# Patient Record
Sex: Male | Born: 1940 | Race: White | Hispanic: No | Marital: Married | Smoking: Former smoker
Health system: Southern US, Community
[De-identification: ages and names within clinical notes are randomized; demographics above are authoritative.]

## PROBLEM LIST (undated history)

## (undated) DIAGNOSIS — M109 Gout, unspecified: Secondary | ICD-10-CM

## (undated) DIAGNOSIS — H353190 Nonexudative age-related macular degeneration, unspecified eye, stage unspecified: Secondary | ICD-10-CM

## (undated) DIAGNOSIS — D649 Anemia, unspecified: Secondary | ICD-10-CM

## (undated) DIAGNOSIS — I10 Essential (primary) hypertension: Secondary | ICD-10-CM

## (undated) DIAGNOSIS — I482 Chronic atrial fibrillation, unspecified: Secondary | ICD-10-CM

## (undated) DIAGNOSIS — Z8719 Personal history of other diseases of the digestive system: Secondary | ICD-10-CM

## (undated) DIAGNOSIS — R011 Cardiac murmur, unspecified: Secondary | ICD-10-CM

## (undated) DIAGNOSIS — C801 Malignant (primary) neoplasm, unspecified: Secondary | ICD-10-CM

## (undated) DIAGNOSIS — R319 Hematuria, unspecified: Secondary | ICD-10-CM

## (undated) DIAGNOSIS — I219 Acute myocardial infarction, unspecified: Secondary | ICD-10-CM

## (undated) DIAGNOSIS — I2510 Atherosclerotic heart disease of native coronary artery without angina pectoris: Secondary | ICD-10-CM

## (undated) DIAGNOSIS — I499 Cardiac arrhythmia, unspecified: Secondary | ICD-10-CM

## (undated) DIAGNOSIS — N289 Disorder of kidney and ureter, unspecified: Secondary | ICD-10-CM

## (undated) DIAGNOSIS — E785 Hyperlipidemia, unspecified: Secondary | ICD-10-CM

## (undated) DIAGNOSIS — Z9289 Personal history of other medical treatment: Secondary | ICD-10-CM

## (undated) DIAGNOSIS — I714 Abdominal aortic aneurysm, without rupture: Secondary | ICD-10-CM

## (undated) DIAGNOSIS — K269 Duodenal ulcer, unspecified as acute or chronic, without hemorrhage or perforation: Secondary | ICD-10-CM

## (undated) HISTORY — PX: CATARACT EXTRACTION: SUR2

## (undated) HISTORY — DX: Personal history of other diseases of the digestive system: Z87.19

## (undated) HISTORY — DX: Cardiac murmur, unspecified: R01.1

## (undated) HISTORY — PX: OTHER SURGICAL HISTORY: SHX169

## (undated) HISTORY — DX: Chronic atrial fibrillation, unspecified: I48.20

## (undated) HISTORY — DX: Essential (primary) hypertension: I10

## (undated) HISTORY — DX: Acute myocardial infarction, unspecified: I21.9

## (undated) HISTORY — DX: Atherosclerotic heart disease of native coronary artery without angina pectoris: I25.10

## (undated) HISTORY — PX: CYSTOSCOPY: SUR368

## (undated) HISTORY — DX: Abdominal aortic aneurysm, without rupture: I71.4

## (undated) HISTORY — DX: Duodenal ulcer, unspecified as acute or chronic, without hemorrhage or perforation: K26.9

## (undated) HISTORY — DX: Disorder of kidney and ureter, unspecified: N28.9

## (undated) HISTORY — PX: CAROTID ENDARTERECTOMY: SUR193

## (undated) HISTORY — PX: CARDIAC CATHETERIZATION: SHX172

## (undated) HISTORY — PX: TONSILLECTOMY: SUR1361

## (undated) HISTORY — DX: Anemia, unspecified: D64.9

## (undated) HISTORY — DX: Hyperlipidemia, unspecified: E78.5

---

## 2003-12-05 ENCOUNTER — Inpatient Hospital Stay (HOSPITAL_COMMUNITY): Admission: RE | Admit: 2003-12-05 | Discharge: 2003-12-06 | Payer: Self-pay | Admitting: Vascular Surgery

## 2003-12-05 ENCOUNTER — Encounter (INDEPENDENT_AMBULATORY_CARE_PROVIDER_SITE_OTHER): Payer: Self-pay | Admitting: *Deleted

## 2004-01-11 ENCOUNTER — Encounter (INDEPENDENT_AMBULATORY_CARE_PROVIDER_SITE_OTHER): Payer: Self-pay | Admitting: *Deleted

## 2004-01-11 ENCOUNTER — Ambulatory Visit (HOSPITAL_COMMUNITY): Admission: RE | Admit: 2004-01-11 | Discharge: 2004-01-12 | Payer: Self-pay | Admitting: Vascular Surgery

## 2004-01-23 ENCOUNTER — Ambulatory Visit: Payer: Self-pay | Admitting: Internal Medicine

## 2004-06-26 ENCOUNTER — Ambulatory Visit: Payer: Self-pay | Admitting: Internal Medicine

## 2004-07-10 ENCOUNTER — Ambulatory Visit: Payer: Self-pay | Admitting: Internal Medicine

## 2004-07-24 ENCOUNTER — Ambulatory Visit: Payer: Self-pay | Admitting: Internal Medicine

## 2005-01-31 ENCOUNTER — Ambulatory Visit: Payer: Self-pay | Admitting: Internal Medicine

## 2005-02-18 ENCOUNTER — Ambulatory Visit: Payer: Self-pay | Admitting: Internal Medicine

## 2005-10-29 ENCOUNTER — Ambulatory Visit: Payer: Self-pay | Admitting: Internal Medicine

## 2005-10-29 ENCOUNTER — Ambulatory Visit: Payer: Self-pay | Admitting: Cardiology

## 2005-11-04 ENCOUNTER — Ambulatory Visit: Payer: Self-pay | Admitting: Cardiology

## 2006-08-01 ENCOUNTER — Encounter (INDEPENDENT_AMBULATORY_CARE_PROVIDER_SITE_OTHER): Payer: Self-pay | Admitting: *Deleted

## 2006-08-12 ENCOUNTER — Telehealth (INDEPENDENT_AMBULATORY_CARE_PROVIDER_SITE_OTHER): Payer: Self-pay | Admitting: *Deleted

## 2006-09-04 ENCOUNTER — Ambulatory Visit: Payer: Self-pay | Admitting: Internal Medicine

## 2006-09-11 ENCOUNTER — Ambulatory Visit: Payer: Self-pay | Admitting: Internal Medicine

## 2006-09-11 LAB — CONVERTED CEMR LAB
Cholesterol, target level: 200 mg/dL
HDL goal, serum: 40 mg/dL
LDL Goal: 100 mg/dL

## 2006-09-12 ENCOUNTER — Encounter (INDEPENDENT_AMBULATORY_CARE_PROVIDER_SITE_OTHER): Payer: Self-pay | Admitting: *Deleted

## 2006-09-12 LAB — CONVERTED CEMR LAB
ALT: 19 units/L (ref 0–53)
AST: 18 units/L (ref 0–37)
Albumin: 3.5 g/dL (ref 3.5–5.2)
Alkaline Phosphatase: 60 units/L (ref 39–117)
BUN: 17 mg/dL (ref 6–23)
Basophils Absolute: 0 10*3/uL (ref 0.0–0.1)
Basophils Relative: 0.5 % (ref 0.0–1.0)
Bilirubin, Direct: 0.2 mg/dL (ref 0.0–0.3)
CO2: 31 meq/L (ref 19–32)
Calcium: 9.5 mg/dL (ref 8.4–10.5)
Chloride: 103 meq/L (ref 96–112)
Cholesterol: 163 mg/dL (ref 0–200)
Creatinine, Ser: 1.5 mg/dL (ref 0.4–1.5)
Creatinine,U: 176.9 mg/dL
Eosinophils Absolute: 0.1 10*3/uL (ref 0.0–0.6)
Eosinophils Relative: 1.7 % (ref 0.0–5.0)
GFR calc Af Amer: 60 mL/min
GFR calc non Af Amer: 50 mL/min
Glucose, Bld: 107 mg/dL — ABNORMAL HIGH (ref 70–99)
HCT: 36.4 % — ABNORMAL LOW (ref 39.0–52.0)
HDL: 46 mg/dL (ref >39.0)
Hemoglobin: 12.7 g/dL — ABNORMAL LOW (ref 13.0–17.0)
Hgb A1c MFr Bld: 5.3 % (ref 4.6–6.0)
LDL Cholesterol: 92 mg/dL (ref 0–99)
Lymphocytes Relative: 21.1 % (ref 12.0–46.0)
MCHC: 34.9 g/dL (ref 30.0–36.0)
MCV: 101.1 fL — ABNORMAL HIGH (ref 78.0–100.0)
Microalb Creat Ratio: 5.1 mg/g (ref 0.0–30.0)
Microalb, Ur: 0.9 mg/dL (ref 0.0–1.9)
Monocytes Absolute: 0.5 10*3/uL (ref 0.2–0.7)
Monocytes Relative: 9.1 % (ref 3.0–11.0)
Neutro Abs: 3.8 10*3/uL (ref 1.4–7.7)
Neutrophils Relative %: 67.6 % (ref 43.0–77.0)
PSA: 1.3 ng/mL (ref 0.10–4.00)
Platelets: 143 10*3/uL — ABNORMAL LOW (ref 150–400)
Potassium: 4.5 meq/L (ref 3.5–5.1)
RBC: 3.6 M/uL — ABNORMAL LOW (ref 4.22–5.81)
RDW: 14.8 % — ABNORMAL HIGH (ref 11.5–14.6)
Sodium: 141 meq/L (ref 135–145)
TSH: 0.77 microintl units/mL (ref 0.35–5.50)
Total Bilirubin: 1.5 mg/dL — ABNORMAL HIGH (ref 0.3–1.2)
Total CHOL/HDL Ratio: 3.5
Total Protein: 6.6 g/dL (ref 6.0–8.3)
Triglycerides: 125 mg/dL (ref 0–149)
VLDL: 25 mg/dL (ref 0–40)
WBC: 5.6 10*3/uL (ref 4.5–10.5)

## 2006-12-04 ENCOUNTER — Telehealth (INDEPENDENT_AMBULATORY_CARE_PROVIDER_SITE_OTHER): Payer: Self-pay | Admitting: *Deleted

## 2006-12-05 ENCOUNTER — Encounter: Payer: Self-pay | Admitting: Internal Medicine

## 2007-08-21 ENCOUNTER — Telehealth (INDEPENDENT_AMBULATORY_CARE_PROVIDER_SITE_OTHER): Payer: Self-pay | Admitting: *Deleted

## 2007-08-26 ENCOUNTER — Telehealth (INDEPENDENT_AMBULATORY_CARE_PROVIDER_SITE_OTHER): Payer: Self-pay | Admitting: *Deleted

## 2007-10-27 ENCOUNTER — Telehealth (INDEPENDENT_AMBULATORY_CARE_PROVIDER_SITE_OTHER): Payer: Self-pay | Admitting: *Deleted

## 2007-11-24 ENCOUNTER — Telehealth (INDEPENDENT_AMBULATORY_CARE_PROVIDER_SITE_OTHER): Payer: Self-pay | Admitting: *Deleted

## 2007-11-26 ENCOUNTER — Telehealth (INDEPENDENT_AMBULATORY_CARE_PROVIDER_SITE_OTHER): Payer: Self-pay | Admitting: *Deleted

## 2007-12-10 ENCOUNTER — Telehealth (INDEPENDENT_AMBULATORY_CARE_PROVIDER_SITE_OTHER): Payer: Self-pay | Admitting: *Deleted

## 2007-12-24 ENCOUNTER — Ambulatory Visit: Payer: Self-pay | Admitting: Internal Medicine

## 2007-12-24 DIAGNOSIS — I252 Old myocardial infarction: Secondary | ICD-10-CM

## 2007-12-24 DIAGNOSIS — I482 Chronic atrial fibrillation, unspecified: Secondary | ICD-10-CM

## 2007-12-24 DIAGNOSIS — E785 Hyperlipidemia, unspecified: Secondary | ICD-10-CM | POA: Insufficient documentation

## 2007-12-24 DIAGNOSIS — R7309 Other abnormal glucose: Secondary | ICD-10-CM

## 2007-12-24 DIAGNOSIS — I10 Essential (primary) hypertension: Secondary | ICD-10-CM | POA: Insufficient documentation

## 2007-12-24 DIAGNOSIS — I2510 Atherosclerotic heart disease of native coronary artery without angina pectoris: Secondary | ICD-10-CM

## 2007-12-25 ENCOUNTER — Encounter (INDEPENDENT_AMBULATORY_CARE_PROVIDER_SITE_OTHER): Payer: Self-pay | Admitting: *Deleted

## 2008-01-01 ENCOUNTER — Ambulatory Visit: Payer: Self-pay | Admitting: Cardiovascular Disease

## 2008-01-05 ENCOUNTER — Ambulatory Visit: Payer: Self-pay | Admitting: Cardiology

## 2008-01-05 ENCOUNTER — Ambulatory Visit: Payer: Self-pay | Admitting: Cardiovascular Disease

## 2008-01-05 LAB — CONVERTED CEMR LAB
ALT: 13 units/L (ref 0–53)
AST: 20 units/L (ref 0–37)
Albumin: 3.7 g/dL (ref 3.5–5.2)
Alkaline Phosphatase: 81 units/L (ref 39–117)
BUN: 19 mg/dL (ref 6–23)
Basophils Absolute: 0.1 10*3/uL (ref 0.0–0.1)
Basophils Relative: 0.9 % (ref 0.0–3.0)
Bilirubin, Direct: 0.3 mg/dL (ref 0.0–0.3)
CO2: 30 meq/L (ref 19–32)
Calcium: 9.5 mg/dL (ref 8.4–10.5)
Chloride: 101 meq/L (ref 96–112)
Cholesterol: 162 mg/dL (ref 0–200)
Creatinine, Ser: 1.6 mg/dL — ABNORMAL HIGH (ref 0.4–1.5)
Eosinophils Absolute: 0.1 10*3/uL (ref 0.0–0.7)
Eosinophils Relative: 1.9 % (ref 0.0–5.0)
GFR calc Af Amer: 56 mL/min
GFR calc non Af Amer: 46 mL/min
Glucose, Bld: 97 mg/dL (ref 70–99)
HCT: 36.4 % — ABNORMAL LOW (ref 39.0–52.0)
HDL: 78.6 mg/dL (ref >39.0)
Hemoglobin: 12.5 g/dL — ABNORMAL LOW (ref 13.0–17.0)
Hgb A1c MFr Bld: 5.3 % (ref 4.6–6.0)
LDL Cholesterol: 62 mg/dL (ref 0–99)
Lymphocytes Relative: 16.8 % (ref 12.0–46.0)
MCHC: 34.3 g/dL (ref 30.0–36.0)
MCV: 101.7 fL — ABNORMAL HIGH (ref 78.0–100.0)
Monocytes Absolute: 0.7 10*3/uL (ref 0.1–1.0)
Monocytes Relative: 9.7 % (ref 3.0–12.0)
Neutro Abs: 4.9 10*3/uL (ref 1.4–7.7)
Neutrophils Relative %: 70.7 % (ref 43.0–77.0)
PSA: 1.31 ng/mL (ref 0.10–4.00)
Platelets: 137 10*3/uL — ABNORMAL LOW (ref 150–400)
Potassium: 4.1 meq/L (ref 3.5–5.1)
RBC: 3.57 M/uL — ABNORMAL LOW (ref 4.22–5.81)
RDW: 15.5 % — ABNORMAL HIGH (ref 11.5–14.6)
Sodium: 138 meq/L (ref 135–145)
TSH: 0.75 microintl units/mL (ref 0.35–5.50)
Total Bilirubin: 1.6 mg/dL — ABNORMAL HIGH (ref 0.3–1.2)
Total CHOL/HDL Ratio: 2.1
Total Protein: 7.3 g/dL (ref 6.0–8.3)
Triglycerides: 106 mg/dL (ref 0–149)
VLDL: 21 mg/dL (ref 0–40)
WBC: 7 10*3/uL (ref 4.5–10.5)

## 2008-01-13 ENCOUNTER — Ambulatory Visit: Payer: Self-pay | Admitting: Internal Medicine

## 2008-01-20 ENCOUNTER — Encounter: Payer: Self-pay | Admitting: Cardiovascular Disease

## 2008-01-20 ENCOUNTER — Ambulatory Visit: Payer: Self-pay

## 2008-01-27 ENCOUNTER — Ambulatory Visit: Payer: Self-pay | Admitting: Cardiovascular Disease

## 2008-02-11 ENCOUNTER — Ambulatory Visit: Payer: Self-pay | Admitting: Cardiology

## 2008-02-19 ENCOUNTER — Ambulatory Visit: Payer: Self-pay | Admitting: Internal Medicine

## 2008-03-04 ENCOUNTER — Ambulatory Visit: Payer: Self-pay | Admitting: Cardiology

## 2008-03-17 ENCOUNTER — Ambulatory Visit: Payer: Self-pay | Admitting: Internal Medicine

## 2008-03-28 ENCOUNTER — Ambulatory Visit: Payer: Self-pay | Admitting: Cardiovascular Disease

## 2008-04-14 ENCOUNTER — Ambulatory Visit: Payer: Self-pay | Admitting: Cardiology

## 2008-04-28 ENCOUNTER — Ambulatory Visit: Payer: Self-pay | Admitting: Internal Medicine

## 2008-05-26 ENCOUNTER — Ambulatory Visit: Payer: Self-pay | Admitting: Cardiology

## 2008-06-16 ENCOUNTER — Telehealth (INDEPENDENT_AMBULATORY_CARE_PROVIDER_SITE_OTHER): Payer: Self-pay | Admitting: *Deleted

## 2008-06-23 ENCOUNTER — Ambulatory Visit: Payer: Self-pay | Admitting: Cardiology

## 2008-07-08 ENCOUNTER — Ambulatory Visit: Payer: Self-pay | Admitting: Internal Medicine

## 2008-07-08 LAB — CONVERTED CEMR LAB: INR: 2.1

## 2008-07-12 ENCOUNTER — Encounter: Payer: Self-pay | Admitting: *Deleted

## 2008-08-17 ENCOUNTER — Encounter: Payer: Self-pay | Admitting: *Deleted

## 2008-09-19 ENCOUNTER — Encounter: Payer: Self-pay | Admitting: Cardiology

## 2008-10-20 ENCOUNTER — Ambulatory Visit: Payer: Self-pay | Admitting: Cardiovascular Disease

## 2009-02-11 HISTORY — PX: EYE SURGERY: SHX253

## 2009-02-13 ENCOUNTER — Telehealth (INDEPENDENT_AMBULATORY_CARE_PROVIDER_SITE_OTHER): Payer: Self-pay | Admitting: *Deleted

## 2009-02-13 ENCOUNTER — Encounter (INDEPENDENT_AMBULATORY_CARE_PROVIDER_SITE_OTHER): Payer: Self-pay | Admitting: *Deleted

## 2009-02-23 ENCOUNTER — Ambulatory Visit: Payer: Self-pay | Admitting: Internal Medicine

## 2009-02-26 LAB — CONVERTED CEMR LAB
Cholesterol: 161 mg/dL (ref 0–200)
HDL: 80.3 mg/dL (ref >39.00)
LDL Cholesterol: 57 mg/dL (ref 0–99)
Total CHOL/HDL Ratio: 2
Triglycerides: 121 mg/dL (ref 0.0–149.0)
VLDL: 24.2 mg/dL (ref 0.0–40.0)

## 2009-02-27 ENCOUNTER — Encounter (INDEPENDENT_AMBULATORY_CARE_PROVIDER_SITE_OTHER): Payer: Self-pay | Admitting: *Deleted

## 2009-03-02 ENCOUNTER — Ambulatory Visit: Payer: Self-pay | Admitting: Internal Medicine

## 2009-03-02 DIAGNOSIS — IMO0001 Reserved for inherently not codable concepts without codable children: Secondary | ICD-10-CM | POA: Insufficient documentation

## 2009-03-02 DIAGNOSIS — D692 Other nonthrombocytopenic purpura: Secondary | ICD-10-CM | POA: Insufficient documentation

## 2009-03-02 DIAGNOSIS — N259 Disorder resulting from impaired renal tubular function, unspecified: Secondary | ICD-10-CM | POA: Insufficient documentation

## 2009-03-02 DIAGNOSIS — D649 Anemia, unspecified: Secondary | ICD-10-CM

## 2009-03-03 LAB — CONVERTED CEMR LAB
ALT: 18 units/L (ref 0–53)
AST: 24 units/L (ref 0–37)
Albumin: 4.1 g/dL (ref 3.5–5.2)
Alkaline Phosphatase: 70 units/L (ref 39–117)
BUN: 19 mg/dL (ref 6–23)
Basophils Absolute: 0 10*3/uL (ref 0.0–0.1)
Basophils Relative: 0.1 % (ref 0.0–3.0)
Bilirubin, Direct: 0.3 mg/dL (ref 0.0–0.3)
CO2: 30 meq/L (ref 19–32)
Calcium: 9.9 mg/dL (ref 8.4–10.5)
Chloride: 93 meq/L — ABNORMAL LOW (ref 96–112)
Creatinine, Ser: 1.5 mg/dL (ref 0.4–1.5)
Eosinophils Absolute: 0 10*3/uL (ref 0.0–0.7)
Eosinophils Relative: 0.4 % (ref 0.0–5.0)
Folate: >20 ng/mL
GFR calc non Af Amer: 49.31 mL/min (ref >60)
Glucose, Bld: 102 mg/dL — ABNORMAL HIGH (ref 70–99)
HCT: 40 % (ref 39.0–52.0)
Hemoglobin: 13.4 g/dL (ref 13.0–17.0)
Iron: 116 ug/dL (ref 42–165)
Lymphocytes Relative: 13.5 % (ref 12.0–46.0)
Lymphs Abs: 0.9 10*3/uL (ref 0.7–4.0)
MCHC: 33.4 g/dL (ref 30.0–36.0)
MCV: 110.3 fL — ABNORMAL HIGH (ref 78.0–100.0)
Monocytes Absolute: 0.7 10*3/uL (ref 0.1–1.0)
Monocytes Relative: 10.4 % (ref 3.0–12.0)
Neutro Abs: 5 10*3/uL (ref 1.4–7.7)
Neutrophils Relative %: 75.6 % (ref 43.0–77.0)
PSA: 1.48 ng/mL (ref 0.10–4.00)
Platelets: 123 10*3/uL — ABNORMAL LOW (ref 150.0–400.0)
Potassium: 4 meq/L (ref 3.5–5.1)
RBC: 3.62 M/uL — ABNORMAL LOW (ref 4.22–5.81)
RDW: 14.1 % (ref 11.5–14.6)
Saturation Ratios: 27 % (ref 20.0–50.0)
Sodium: 132 meq/L — ABNORMAL LOW (ref 135–145)
Total Bilirubin: 2 mg/dL — ABNORMAL HIGH (ref 0.3–1.2)
Total CK: 73 units/L (ref 7–232)
Total Protein: 7.4 g/dL (ref 6.0–8.3)
Transferrin: 307.2 mg/dL (ref 212.0–360.0)
Vit D, 25-Hydroxy: 34 ng/mL (ref 30–89)
Vitamin B-12: 357 pg/mL (ref 211–911)
WBC: 6.6 10*3/uL (ref 4.5–10.5)

## 2009-05-25 ENCOUNTER — Telehealth (INDEPENDENT_AMBULATORY_CARE_PROVIDER_SITE_OTHER): Payer: Self-pay | Admitting: *Deleted

## 2009-08-17 ENCOUNTER — Telehealth (INDEPENDENT_AMBULATORY_CARE_PROVIDER_SITE_OTHER): Payer: Self-pay | Admitting: *Deleted

## 2010-03-08 ENCOUNTER — Ambulatory Visit
Admission: RE | Admit: 2010-03-08 | Discharge: 2010-03-08 | Payer: Self-pay | Source: Home / Self Care | Attending: Internal Medicine | Admitting: Internal Medicine

## 2010-03-08 DIAGNOSIS — H9190 Unspecified hearing loss, unspecified ear: Secondary | ICD-10-CM | POA: Insufficient documentation

## 2010-03-08 DIAGNOSIS — J309 Allergic rhinitis, unspecified: Secondary | ICD-10-CM | POA: Insufficient documentation

## 2010-03-13 ENCOUNTER — Encounter: Payer: Self-pay | Admitting: Internal Medicine

## 2010-03-14 NOTE — Progress Notes (Signed)
Summary: Refill Request  Phone Note Refill Request Message from:  Pharmacy on [facility] Fax #: [phone]  Refills Requested: Medication #1:  CRESTOR 20 MG TABS takes 1 tablet 4x/wk   Dosage confirmed as above?Dosage Confirmed   Supply Requested: 3 months Next Appointment Scheduled: none Initial call taken by: Harold Barban,  May 25, 2009 8:55 AM  Follow-up for Phone Call        RX was faxed to [phone] Follow-up by: Shonna Chock,  May 25, 2009 9:00 AM    Prescriptions: CRESTOR 20 MG TABS (ROSUVASTATIN CALCIUM) takes 1 tablet 4x/wk  #90 x 3   Entered by:   Shonna Chock   Authorized by:   Marga Melnick MD   Signed by:   Shonna Chock on 05/25/2009   Method used:   Print then Give to Patient   RxID:   [id number]

## 2010-03-14 NOTE — Assessment & Plan Note (Signed)
Summary: DISCUSS MED/LABS/KDC   Vital Signs:  Patient profile:   70 year old male Weight:      194 pounds Pulse rate:   72 / minute Resp:     15 per minute BP sitting:   128 / 70  (left arm) Cuff size:   large  Vitals Entered By: Shonna Chock (March 02, 2009 9:08 AM) CC: Follow-up visit: discuss labs (copy given) Comments REVIEWED MED LIST, PATIENT AGREED DOSE AND INSTRUCTION CORRECT    Primary Care Provider:  Marga Melnick MD  CC:  Follow-up visit: discuss labs (copy given).  History of Present Illness: Based on Dr Gibson Ramp 10/20/2008 discussion (OV reviewed) , he has elected to take 325 mg ASA & Toprol for persistant AF which is asymptomatic.  Labs reviewed & risks discussed; A1c & lipids have been consistently @ goal.  Allergies (verified): No Known Drug Allergies  Past History:  Past Medical History: Hyperglycemia Coronary artery disease Hyperlipidemia Hypertension Myocardial infarction, hx of 1983 Atrial Fibrillation-chronic persistent , Dr Clifton James Anemia-NOS Renal insufficiency Gilbert's Syndrome  Past Surgical History: Cath/ angioplasty  1979,  1983, [facility] by Dr. Brett Canales [facility] of angioplasty); Carotid endarterectomy  bilaterally 2007; Tonsillectomy No colonoscopy to date(" I don't have a good excuse"). SOC reviewed  Family History: Father: renal failure, CA ? primary, MI in  34s Mother: CA ? primary Siblings: bro ? CVA; no FH colon CA  Social History: Occupation: Stockbroker Married, 3 children Alcohol use-yes: socailly Regular exercise-yes: walking 15-20 min /day Remote tobacco-smoked 3ppd for 25 years but  stopped 1983 No illicit drug use  Review of Systems General:  Purposeful weight loss. CV:  Denies chest pain or discomfort, difficulty breathing at night, difficulty breathing while lying down, leg cramps with exertion, palpitations, shortness of breath with exertion, swelling of feet, and swelling of hands. GI:  Denies abdominal  pain, bloody stools, dark tarry stools, and indigestion. MS:  Complains of muscle aches; Cramps in legs in am & @ night, better standing. Derm:  Complains of changes in color of skin; denies lesion(s) and rash. Heme:  Denies abnormal bruising and bleeding; Vascular lesions over UE.  Physical Exam  General:  well-nourished,in no acute distress; alert,appropriate and cooperative throughout examination Neck:  No deformities, masses, or tenderness noted. Lungs:  Normal respiratory effort, chest expands symmetrically. Lungs are clear to auscultation, no crackles or wheezes. Heart:  bradycardia and irregular rhythm.   Abdomen:  Bowel sounds positive,abdomen soft and non-tender without masses, organomegaly or hernias noted. Pulses:  R and L carotid,radial,dorsalis pedis and posterior tibial pulses are full and equal bilaterally Extremities:  No clubbing, cyanosis, edema. Varicosities Neurologic:  alert & oriented X3 and DTRs symmetrical and normal.   Skin:  Vascular lesions over hands Cervical Nodes:  No lymphadenopathy noted Axillary Nodes:  No palpable lymphadenopathy Psych:  memory intact for recent and remote, normally interactive, and good eye contact.     Impression & Recommendations:  Problem # 1:  MUSCLE PAIN (ICD-729.1)  His updated medication list for this problem includes:    Bayer Aspirin 325 Mg Tabs (Aspirin) .Marland Kitchen... 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 from ASA 325 mg once daily & aging skin changes  Orders: Venipuncture (13244) TLB-B12 + Folate Pnl (01027_25366-Y40/HKV) TLB-IBC Pnl (Iron/FE;Transferrin) (83550-IBC) TLB-CBC Platelet - w/Differential (85025-CBCD)  Problem # 3:  ANEMIA-NOS (ICD-285.9)  PMH of  Orders: Venipuncture (42595)  TLB-B12 + Folate Pnl (82746_82607-B12/FOL) TLB-IBC Pnl (Iron/FE;Transferrin)  (83550-IBC) TLB-CBC Platelet - w/Differential (85025-CBCD)  Problem # 4:  RENAL INSUFFICIENCY (ICD-588.9)  PMH of  Orders: Venipuncture (75643) TLB-BMP (Basic Metabolic Panel-BMET) (80048-METABOL)  Problem # 5:  ATRIAL FIBRILLATION (ICD-427.31) as per Dr Clifton James His updated medication list for this problem includes:    Metoprolol Succinate 100 Mg Tb24 (Metoprolol succinate) .Marland Kitchen... 1 by mouth qd    Lotrel 10-40 Mg Caps (Amlodipine besy-benazepril hcl) .Marland Kitchen... 1 by mouth qd    Bayer Aspirin 325 Mg Tabs (Aspirin) .Marland Kitchen... 1 by mouth once daily  Complete Medication List: 1)  Metoprolol Succinate 100 Mg Tb24 (Metoprolol succinate) .Marland Kitchen.. 1 by mouth qd 2)  Hydrochlorothiazide 25 Mg Tabs (Hydrochlorothiazide) .Marland Kitchen.. 1 by mouth qd 3)  Crestor 20 Mg Tabs (Rosuvastatin calcium) .... Takes 1 tablet 4x/wk 4)  Lotrel 10-40 Mg Caps (Amlodipine besy-benazepril hcl) .Marland Kitchen.. 1 by mouth qd 5)  Mvi  6)  Bayer Aspirin 325 Mg Tabs (Aspirin) .Marland Kitchen.. 1 by mouth once daily  Other Orders: TLB-PSA (Prostate Specific Antigen) (84153-PSA) TLB-Hepatic/Liver Function Pnl (80076-HEPATIC)  Patient Instructions: 1)  Schedule a colonoscopy  to help detect colon cancer as per [facility] as discussed.

## 2010-03-14 NOTE — Progress Notes (Signed)
Summary: REFILL  Phone Note Refill Request Message from:  F[facility] from [facility] on [facility] FAX [phone]  Refills Requested: Medication #1:  METOPROLOL SUCCINATE 100 MG TB24 1 by mouth qd  Medication #2:  HYDROCHLOROTHIAZIDE 25 MG TABS 1 by mouth qd  Medication #3:  CRESTOR 20 MG TABS takes 1 tablet 4x/wk  Medication #4:  LOTREL 10-40 MG CAPS 1 by mouth qd Initial call taken by: Barb Merino,  February 13, 2009 10:42 AM    Prescriptions: LOTREL 10-40 MG CAPS (AMLODIPINE BESY-BENAZEPRIL HCL) 1 by mouth qd  #90 x 1   Entered by:   Shonna Chock   Authorized by:   Marga Melnick MD   Signed by:   Shonna Chock on 02/13/2009   Method used:   Faxed to ...       [facility] [address] (mail-order)       [address] [place], [place]  [address]       Ph: [phone]       Fax: [phone]   RxID:   [id number] CRESTOR 20 MG TABS (ROSUVASTATIN CALCIUM) takes 1 tablet 4x/wk  #90 x 0   Entered by:   Shonna Chock   Authorized by:   Marga Melnick MD   Signed by:   Shonna Chock on 02/13/2009   Method used:   Faxed to ...       [facility] [address] (mail-order)       [address] [place], [place]  [address]       Ph: [phone]       Fax: [phone]   RxID:   [id number] HYDROCHLOROTHIAZIDE 25 MG TABS (HYDROCHLOROTHIAZIDE) 1 by mouth qd  #90 x 1   Entered by:   Shonna Chock   Authorized by:   Marga Melnick MD   Signed by:   Shonna Chock on 02/13/2009   Method used:   Faxed to ...       [facility] [address] (mail-order)       [address] [place], [place]  [address]       Ph: [phone]       Fax: [phone]   RxID:   [id number] METOPROLOL SUCCINATE 100 MG TB24 (METOPROLOL SUCCINATE) 1 by mouth qd  #90 x 1   Entered by:   Shonna Chock   Authorized by:   Marga Melnick MD   Signed by:   Shonna Chock on 02/13/2009   Method used:   Faxed to ...       [facility] [address] (mail-order)       [address] [place], [place]  [address]       Ph: [phone]       Fax: [phone]   RxID:   [id number]

## 2010-03-14 NOTE — Letter (Signed)
Summary: Results Follow up Letter  [facility] [place]  [address] [place], [place] [address]   Phone: [phone]  Fax: [phone]    02/27/2009 MRN: [id number]  [name] [address] [place], [place]  [address]  Dear [name],  The following are the results of your recent test(s):  Test         Result    Pap Smear:        Normal _____  Not Normal _____ Comments: ______________________________________________________ Cholesterol: LDL(Bad cholesterol):         Your goal is less than:         HDL (Good cholesterol):       Your goal is more than: Comments:  ______________________________________________________ Mammogram:        Normal _____  Not Normal _____ Comments:  ___________________________________________________________________ Hemoccult:        Normal _____  Not normal _______ Comments:    _____________________________________________________________________ Other Tests: Please see attached labs done on 02/23/2009    We routinely do not discuss normal results over the telephone.  If you desire a copy of the results, or you have any questions about this information we can discuss them at your next office visit.   Sincerely,

## 2010-03-14 NOTE — Letter (Signed)
Summary: Primary Care Appointment Letter  [facility] [place]  [address] [place], [place] [address]   Phone: [phone]  Fax: [phone]    02/13/2009 MRN: [id number]  [name] [address] [place], [place]  [address]  Dear [name],   Your Primary Care Physician Marga Melnick MD has indicated that:    ___X____it is time to schedule an appointment( Last Chlosterol check was 01/05/2008) An appointment(come fasting) with the Dr. is necessary to continue refilling meds, lab order will be giving at follow-up appointment.    _______you missed your appointment on______ and need to call and          reschedule.    _______you need to have lab work done.    _______you need to schedule an appointment discuss lab or test results.    _______you need to call to reschedule your appointment that is                       scheduled on _________.     Please call our office as soon as possible. Our phone number is [phone]. Please press option 1. Our office is open 8a-12noon and 1p-5p, Monday through Friday.     Thank you,    [facility]

## 2010-03-14 NOTE — Progress Notes (Signed)
Summary: refill  Phone Note Refill Request Message from:  Fax from Pharmacy on August 17, 2009 10:58 AM  Refills Requested: Medication #1:  METOPROLOL SUCCINATE 100 MG TB24 1 by mouth qd  Medication #2:  LOTREL 10-40 MG CAPS 1 by mouth qd  Medication #3:  HYDROCHLOROTHIAZIDE 25 MG TABS 1 by mouth qd [facility] fax [phone]  - tel  [phone]  Initial call taken by: Okey Regal [place],  August 17, 2009 11:00 AM    Prescriptions: LOTREL 10-40 MG CAPS (AMLODIPINE BESY-BENAZEPRIL HCL) 1 by mouth qd  #90 x 2   Entered by:   Shonna Chock   Authorized by:   Marga Melnick MD   Signed by:   Shonna Chock on 08/17/2009   Method used:   Faxed to ...       [facility]order[facility]       [address] [place], [place]  [address]       Ph: [phone]       Fax: [phone]   RxID:   [id number] HYDROCHLOROTHIAZIDE 25 MG TABS (HYDROCHLOROTHIAZIDE) 1 by mouth qd  #90 x 2   Entered by:   Shonna Chock   Authorized by:   Marga Melnick MD   Signed by:   Shonna Chock on 08/17/2009   Method used:   Faxed to ...       [facility] ([facility]order)       [address] [place], [place]  [address]       Ph: [phone]       Fax: [phone]   RxID:   [id number] METOPROLOL SUCCINATE 100 MG TB24 (METOPROLOL SUCCINATE) 1 by mouth qd  #90 x 2   Entered by:   Shonna Chock   Authorized by:   Marga Melnick MD   Signed by:   Shonna Chock on 08/17/2009   Method used:   Faxed to ...       [facility] (mail[facility]order)       [address] [place], [place]  [address]       Ph: [phone]       Fax: [phone]   RxID:   [id number]

## 2010-03-15 NOTE — Assessment & Plan Note (Signed)
Summary: left ear stopped up/kn   Vital Signs:  Patient profile:   70 year old male Weight:      203.6 pounds BMI:     30.18 Temp:     97.6 degrees F oral Pulse rate:   76 / minute BP sitting:   112 / 64  (left arm) Cuff size:   large  Vitals Entered By: Shonna Chock CMA (March 08, 2010 4:15 PM) CC: Left ear stopped up, Ear pain   Primary Care Provider:  Marga Melnick MD  CC:  Left ear stopped up and Ear pain.  History of Present Illness:      This is a 70 year old man who presents with  L ear  symptoms X 1 week.  The patient  reports sensation of fullness and hearing loss, but denies ear discharge, tinnitus, fever, sinus pain,  nasal discharge & frontal  headache.    Current Medications (verified): 1)  Metoprolol Succinate 100 Mg Tb24 (Metoprolol Succinate) .Marland Kitchen.. 1 By Mouth Qd 2)  Hydrochlorothiazide 25 Mg Tabs (Hydrochlorothiazide) .Marland Kitchen.. 1 By Mouth Qd 3)  Crestor 20 Mg Tabs (Rosuvastatin Calcium) .... Takes 1 Tablet 4x/wk 4)  Lotrel 10-40 Mg Caps (Amlodipine Besy-Benazepril Hcl) .Marland Kitchen.. 1 By Mouth Qd 5)  Mvi 6)  Aspirin 81 Mg Tabs (Aspirin) .Marland Kitchen.. 1 By Mouth Once Daily  Allergies (verified): No Known Drug Allergies  Physical Exam  General:  well-nourished,in no acute distress; alert,appropriate and cooperative throughout examination Ears:  R ear normal, L Cerumen impaction, and L decreased hearing.   Note: after hydrogen peroxide soak & repeated gavage  a large wax/ cotton fiber impaction was expelled. L TM slightly edematous but whisper heard @ 6 ft. Nose:  External nasal examination shows no deformity or inflammation. Nasal mucosa are  slightly boggy on L without lesions or exudates. Mouth:  Oral mucosa and oropharynx without lesions or exudates.  Teeth in good repair. 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 1 spray two times a day as needed for congestion  Complete Medication List: 1)  Metoprolol Succinate 100 Mg Tb24 (Metoprolol succinate) .Marland Kitchen.. 1 by mouth qd 2)  Hydrochlorothiazide 25 Mg Tabs (Hydrochlorothiazide) .Marland Kitchen.. 1 by mouth qd 3)  Crestor 20 Mg Tabs (Rosuvastatin calcium) .... Takes 1 tablet 4x/wk 4)  Lotrel 10-40 Mg Caps (Amlodipine besy-benazepril hcl) .Marland Kitchen.. 1 by mouth qd 5)  Mvi  6)  Aspirin 81 Mg Tabs (Aspirin) .Marland Kitchen.. 1 by mouth once daily 7)  Fluticasone Propionate 50 Mcg/act Susp (Fluticasone propionate) .Marland Kitchen.. 1 spray two times a day as needed for congestion  Patient Instructions: 1)  Ear hygiene as per written instructions. Do not use Q Tips. Prescriptions: FLUTICASONE PROPIONATE 50 MCG/ACT SUSP (FLUTICASONE PROPIONATE) 1 spray two times a day as needed for congestion  #1 x 11   Entered and Authorized by:   Marga Melnick MD   Signed by:   Marga Melnick MD on 03/09/2010   Method used:   Historical   RxID:   [id number]    Orders Added: 1)  Est. Patient Level III [14782]

## 2010-03-29 NOTE — Medication Information (Signed)
Summary: Medication Non-Adherence  Medication Non-Adherence   Imported By: Maryln Gottron 03/20/2010 15:11:18  _____________________________________________________________________  External Attachment:    Type:   Image     Comment:   External Document

## 2010-06-18 ENCOUNTER — Other Ambulatory Visit: Payer: Self-pay | Admitting: *Deleted

## 2010-06-19 MED ORDER — METOPROLOL SUCCINATE ER 100 MG PO TB24: 100.0000 mg | ORAL_TABLET | Freq: Every day | ORAL | Status: DC

## 2010-06-19 MED ORDER — ROSUVASTATIN CALCIUM 20 MG PO TABS: ORAL_TABLET | ORAL | Status: DC

## 2010-06-19 NOTE — Telephone Encounter (Signed)
LIPID/HEP 272.4/995.20  

## 2010-06-26 NOTE — Assessment & Plan Note (Signed)
[facility]                            CARDIOLOGY OFFICE NOTE   [name], [name]                       MRN:          [id number]  DATE:03/28/2008                            DOB:          [birth year]    PRIMARY CARE PHYSICIAN:  Martin Dubin. Alwyn Ren, MD, FACP, FCCP   HISTORY OF PRESENT ILLNESS:  [name] is a pleasant 70 year old  Caucasian male with a past medical history significant for atrial  fibrillation, hypertension, hyperlipidemia, coronary artery disease  status post MI, and balloon angioplasty in 1980s, as well as peripheral  vascular disease with bilateral carotid endarterectomies in 2005, who  comes in today for routine cardiac followup.  The patient tells me that  he has been doing well.  He has not been no aware of any palpitations or  irregularity of his heart rhythm.  He has been known to be in atrial  fibrillation since prior to the last visit, but has never aware of this.  He denies any chest pain, shortness of breath, dizziness, near syncope,  syncope, orthopnea, PND, or lower extremity edema.  He continues to work  full-time and is not limited in his activities of daily living by any  symptoms that are suggestive of angina, congestive heart failure, or  uncontrolled heart rate.   When he was seen initially in our office in November 2009, we started  him on Coumadin therapy and he has continued to be followed in our  Coumadin Clinic since then.  He seems to have adequate control of his  heart rate on Toprol 100 mg once daily.   PAST MEDICAL HISTORY:  1. Hypertension.  2. Hyperlipidemia.  3. History of alcohol abuse.  4. Coronary artery disease status post MI and percutaneous coronary      intervention with balloon angioplasty only a 1980s.  This was      performed by Dr. Alanson Mcdonald who is the first person in the world who      has ever performed balloon angioplasty.  This was performed at      [facility] at that time.  5. Peripheral vascular disease status post bilateral carotid      endarterectomy in 2005 by [name].  6. Gout.   CURRENT MEDICATIONS:  1. Coumadin as directed.  2. Toprol-XL 100 mg once daily.  3. Hydrochlorothiazide 25 mg once daily.  4. Crestor 20 mg once daily.  5. Lotrel 10/40 mg once daily.  6. Multivitamin once daily.  7. Aspirin 81 mg once daily.   REVIEW OF SYSTEMS:  As stated in history present illness is otherwise  negative.   PHYSICAL EXAMINATION:  VITAL SIGNS:  Blood pressure 120/70, pulse 52 and  irregular, respirations 12 and unlabored.  GENERAL:  He is a pleasant, middle-aged Caucasian male in no acute  distress.  He is alert and oriented x3.  SKIN:  Warm and dry.  NECK:  No JVD.  No carotid bruits.  No thyromegaly.  No lymphadenopathy.  Bilateral carotid endarterectomy scars are present.  LUNGS:  Clear to  auscultation bilaterally without wheezes, rhonchi, or crackles  noted.  CARDIOVASCULAR:  Irregular rate and rhythm without loud murmurs noted.  No gallops or rubs are noted.  ABDOMEN:  Soft, nontender.  Bowel sounds are present.  EXTREMITIES:  No evidence of lower extremity edema.  Pulses are 2+ in  all extremities.   DIAGNOSTIC STUDIES:  1. A 12-lead EKG obtained in our office today shows atrial      fibrillation with a ventricular rate of 79 beats per minute.  There      are nonspecific ST-segment changes noted.  2. Transthoracic echocardiogram performed on January 20, 2008, shows      normal left ventricular systolic function with ejection fraction of      60%.  The aortic valve was mildly calcified with trivial aortic      valvular regurgitation.  There was mild mitral valvular      regurgitation.  The left atrium was mildly dilated.  The right      ventricle was mildly dilated.  The right atrium was mildly dilated.   ASSESSMENT AND PLAN:  This is a pleasant 70 year old Caucasian male with  known coronary artery disease, hypertension, and  hyperlipidemia, who has  persistent atrial fibrillation.  The patient has been managed with  chronic Coumadin therapy for anticoagulation and has had therapeutic  Coumadin levels over the last month here in our Coumadin Clinic.  He has  good rate control on the beta-blocker therapy and has been asymptomatic.  I would like to continue his aspirin and Coumadin, as I do think he has  a significantly elevated risk of stroke given his risk factors.  I have  discussed the possibility of an outpatient cardioversion; however, he  does not wish to pursue this at this time.  I would like to see him back  in 6 months, at which time, we will have further talks of elective  outpatient cardioversion.  The patient will continue to follow with Dr.  Alwyn Mcdonald for his primary care needs.  I will not make any medication  changes at the current time.     Martin Carrow, MD  Electronically Signed    CM/MedQ  DD: 03/28/2008  DT: 03/29/2008  Job #: [id number]   cc:   Martin Dubin. Alwyn Ren, MD,FACP,FCCP

## 2010-06-26 NOTE — Assessment & Plan Note (Signed)
[facility]                            CARDIOLOGY OFFICE NOTE   GERRITT, GALENTINE                       MRN:          [id number]  DATE:01/01/2008                            DOB:          07/24/1940    PRIMARY CARE PHYSICIAN:  Titus Dubin. Alwyn Ren, MD,FACP,FCCP   REASON FOR VISIT:  Routine cardiology followup.   HISTORY OF PRESENT ILLNESS:  [name] is a pleasant 70 year old  Caucasian male with a past medical history significant for hypertension,  hyperlipidemia, coronary artery disease, status post myocardial  infarction and balloon angioplasty in the 1980s as well as peripheral  vascular disease with bilateral carotid endarterectomies in 2005, who  was previously followed by Dr. Geralynn Rile in our cardiology office.  The patient has not been seen in this office since 2007.  He presents  today to establish care with me.  He was seen recently in the office of  Dr. Alwyn Ren, his primary care physician, and had an EKG which shows  atrial fibrillation.  The patient has never been diagnosed with atrial  fibrillation in the past.  He tells me that he has been doing well and  denies any chest pain, shortness of breath, diaphoresis, nausea,  vomiting, dizziness, near syncope, syncope, orthopnea, PND, or lower  extremity edema.  He is not aware of any irregularity of his heart  rhythm.  He specifically denies any episodes of palpitations or  fluttering in his chest.   PAST MEDICAL HISTORY:  1. Hypertension.  2. Hyperlipidemia.  3. History of alcohol abuse.  4. Coronary artery disease status post myocardial infarction and      percutaneous coronary intervention with balloon angioplasty in the      1980s.  The patient had his balloon angioplasty performed by Dr.      Lovie Chol who was the first person in the world to ever perform      balloon angioplasty.  This was performed at [facility] at      that time.  5. Peripheral vascular disease status  post bilateral carotid      endarterectomy in 2005, by Dr. Hart Rochester.  6. Gout.   PAST SURGICAL HISTORY:  1. Bilateral carotid endarterectomy in 2005.  2. Pyloric stenosis surgery as a child.   ALLERGIES:  No known drug allergies.   CURRENT MEDICATIONS:  1. Enteric-coated aspirin 81 mg once daily.  2. Multivitamin once daily.  3. Metoprolol succinate 100 mg once daily.  4. Hydrochlorothiazide 25 mg once daily.  5. Crestor 20 mg once daily.  6. Lotrel 10/40 mg once daily.   SOCIAL HISTORY:  The patient smoked three packs a day for 25 years, but  tells me that he stopped smoking 25 years ago.  He enjoys 2-3 alcoholic  beverages per day, but tells me that he never drinks more than this.  He  does have a history of heavy alcohol abuse.  He denies illicit drug use.  He is married and has 3 children.  He is employed as a Careers adviser.   FAMILY HISTORY:  The patient's mother and father both died  from cancer.  His brother died and he is unsure of the cause.  He has a sister who is  healthy.  There is no family history of coronary artery disease or  sudden cardiac death.   REVIEW OF SYSTEMS:  As stated in the history of present illness, is  otherwise negative.   PHYSICAL EXAMINATION:  VITAL SIGNS:  Blood pressure 130/66, pulse 100  and irregular, respirations 12 and nonlabored.  GENERAL:  He is a pleasant middle-aged Caucasian male in no acute  distress.  He is alert and oriented x3.  PSYCHIATRIC:  Mood and affect are normal.  MUSCULOSKELETAL:  Muscle strength and tone are normal.  NEUROLOGICAL:  No focal neurological deficits.  SKIN:  Warm and dry.  NECK:  No JVD.  No carotid bruits.  No thyromegaly.  No lymphadenopathy.  HEENT:  Normal.  LUNGS:  Clear to auscultation bilaterally without wheezes, rhonchi, or  crackles noted.  CARDIOVASCULAR:  Irregular rhythm without murmurs, gallops, or rubs.  ABDOMEN:  Soft and nontender.  Bowel sounds are present.  EXTREMITIES:  No evidence of  edema.  Pulses are 2+ in all extremities.   DIAGNOSTIC STUDIES:  A 12-lead EKG performed in our office today shows  atrial fibrillation with a ventricular rate of 84 beats per minute.  There is poor R wave progression throughout the precordial leads.  There  are nonspecific T-wave abnormalities noted.   ASSESSMENT AND PLAN:  This is a pleasant 70 year old Caucasian male with  known coronary artery disease, hypertension, and hyperlipidemia as well  as frequent alcohol use, who presents today with the new diagnosis of  atrial fibrillation.  The patient appears to have good rate control on  beta-blocker therapy.  His risk for stroke is increased given his age,  hypertension, and underlying coronary artery disease.  He has not had  prior strokes.  I think his risk of stroke is increased because of the  following things and he would have a significant reduction in his stroke  risk if he were maintained on chronic Coumadin therapy.  We will  initiate Coumadin therapy today and have him followup in our Coumadin  Clinic for INR check next week.  I would also like to perform a surface  echocardiogram to assess his left ventricular function and also to  assess his chamber sizes.  The patient is aware that he should be  looking for symptoms of palpitations as well as dizziness,  lightheadedness, or syncope.  If he becomes aware of any of these things  he is to alert [place] immediately.  I will plan on seeing him back in the  office in 3 months.  We will check a metabolic profile as well as a CBC  and thyroid stimulating hormone level today.     Verne Carrow, MD  Electronically Signed    CM/MedQ  DD: 01/01/2008  DT: 01/02/2008  Job #: [id number]

## 2010-06-29 NOTE — Discharge Summary (Signed)
NAME:  [name], [name] NO.:  [id number]   MEDICAL RECORD NO.:  [id number]          PATIENT TYPE:  INP   LOCATION:  3311                         FACILITY:  [facility]   PHYSICIAN:  Quita Skye. Hart Rochester, M.D.  DATE OF BIRTH:  22-Nov-1940   DATE OF ADMISSION:  12/05/2003  DATE OF DISCHARGE:  12/06/2003                                 DISCHARGE SUMMARY   ADMISSION DIAGNOSIS:  Right internal carotid artery stenosis   DISCHARGE DIAGNOSIS:  Right internal carotid artery stenosis, status post  right carotid endarterectomy.   PAST MEDICAL HISTORY:  1.  Coronary artery disease, status post PTCA in 1979 and 1983, in [place],      [place], by Dr. __________.  Also status post MI prior to the second      PTCA.  2.  Hypertension.   ALLERGIES:  No known drug allergies.   HISTORY OF PRESENT ILLNESS:  [name] is a 70 year old Caucasian male.  He was found to have carotid bruit early last week and a carotid Duplex  examination was performed at [facility] that revealed 95% right  internal carotid artery stenosis and at least an 80% left internal carotid  artery stenosis.  He was completely asymptomatic.  He was referred to [facility]  for further evaluation.  On November 29, 2003, [name] was evaluated by  Dr. Jerilee Field.  After examination of the patient and review of all of the  available records, Dr. Hart Rochester recommended preceding with right carotid  endarterectomy.  The procedure risks and benefits were discussed with Mr.  [name].  He agreed to proceed with surgery.  Prior to proceeding, Dr.  Hart Rochester recommended preceding with Cardiolite study.  This was performed on  November 30, 2003, by Willa Rough, M.D.  There was no marked EKG change with  adenosine infusion.  His ejection fraction was estimated to be 56%.  Dr.  Myrtis Ser cleared [name] for surgery from a cardiac standpoint.   HOSPITAL COURSE:  On December 05, 2003, [name] was elective admitted to  [facility]  [facility] in the care of Dr. Jerilee Field.  He  underwent the following surgical procedure.  Right carotid endarterectomy  with Dacron patch angioplasty.  He tolerated his procedure well,  transferring in stable condition to the PACU.  He was extubated immediately  following surgery and awoke from anesthesia neurologically intact.  Please  note that this patient was a difficult intubation from anesthesia's point of  view.   [name] remained hemodynamically stable in the immediate postoperative  period.   On morning rounds, December 06, 2003, postoperative day #1,  [name]  reports feeling very well and he is anxious to go home.  Vital signs are  stable with blood pressure 150/60.  He is afebrile.  His room air saturation  is 96%.  His heart is maintaining normal sinus rhythm.  Lungs are clear to  auscultation.  He is tolerating his diet and voiding without difficulty  after removal of the Foley catheter.  His right neck incision is clean and  dry.  There is no swelling.  He has had no difficulty chewing or swallowing.  He remains neurologically intact.  His JP drain had approximately 25 mL  output since surgery.  Plan for [name] was for removal of his JP drain  if he is able to ambulate in the hall and continue to tolerate his diet.  He  will be discharged later in the morning.   LABORATORY DATA:  December 06, 2003, CBC with white blood cells 6.3,  hemoglobin 11.5, hematocrit 32.8, platelets 91.  Chemistries include sodium  130, potassium 2.8, BUN 10, creatinine 1.0, glucose 108.  Please note his  potassium was supplemented this morning.   CONDITION ON DISCHARGE:  Improved.   DISCHARGE INSTRUCTIONS:  1.  Medications:  He is to resume the following home medications at these      dosages:      1.  Toprol 100 mg daily.      2.  Hydrochlorothiazide daily.      3.  Norvasc 4 mg b.i.d.      4.  Lipitor 40 mg daily.      5.  Aspirin 325 mg daily.      6.   Multivitamins daily.  2.  He has been reminded to check his blood pressure daily.  3.  For pain management he may have Tylox one to two p.o. q.4h. p.r.n.  4.  Activity:  He has been asked to refrain from driving or any heavy      lifting for the next two weeks.  5.  Diet:  Continue to be on low fat, low salt diet.  6.  Wound care:  He is to clean his incision daily with soap and water.  He      may shower beginning December 08, 2003.  If his incision shows any sign      of infection, he is to call Dr. Candie Chroman office.  7.  Follow-up:  Dr. Hart Rochester would like to see him back at the CVTS office on      Tuesday, December 27, 2003, at 9:30 in the morning.       CTK/MEDQ  D:  12/06/2003  T:  12/06/2003  Job:  [id number]   cc:   Titus Dubin. Alwyn Ren, M.D. [facility]

## 2010-06-29 NOTE — Letter (Signed)
December 09, 2005    Geoffery Spruce  [address]  [place], [place] [address]   RE:  GIANNI, MIHALIK  MRN:  [id number]  /  DOB:  08-09-1940   Dear Rocky Link,   I received a phone call as well as an office note from Dr. Randa Evens.  He was concerned that you may be having serious rhythm patterns from the  lower chambers related to your prior myocardial infarction. He has  recommended a monitor of your heart rhythm for at least a month to rule such  a process out. Additionally he has recommended a catheterization.   I do encourage you to follow his recommendations as these are proposed in a  preventive manner. If you do not feel comfortable with these  recommendations, I do urge you to get a second opinion with another  cardiologist here in town or at one of the medical centers.   Please let me know your wishes.    Sincerely,      Titus Dubin. Alwyn Ren, MD,FACP,FCCP    WFH/MedQ  DD: 12/09/2005  DT: 12/10/2005  Job #: [id number]   CC:    Salvadore Farber, MD

## 2010-06-29 NOTE — Assessment & Plan Note (Signed)
[facility]                              CARDIOLOGY OFFICE NOTE   ADAEL, CULBREATH                         MRN:          [id number]  DATE:  12/02/2005                              DOB:      1940-10-05    This is to document recent telephone calls with Mr. Giancola.  He is a 70-  year-old gentleman whom I saw in consultation on October 29, 2005 for  syncope.  While each event occurred in the setting of alcohol use, we are  concerned with possible ventricular arrhythmia given his history of prior  myocardial infarction.  Ejection fraction is preserved.  I recommended  evaluation with carotid ultrasounds due to history of prior bilateral  carotid endarterectomies.  I also recommended cardiac catheterization and  outpatient monitoring with a cardio net monitor.  At the time of the visit  the patient agreed to the monitor but refused cardiac catheterization,  saying he would think about it.  The patient was set up with the monitor  for plan of a month.  It was placed on November 04, 2005.  He removed it  the following day and did not replace it.  Monitor showed a normal sinus  rhythm, sinus tachycardia, and sinus bradycardia with some PVCs.  No  malignant arrhythmias.   My nurse, Margaretmary Dys, contacted the patient at home on September 19.  His  wife was insistent that she could not speak with the patient because he was  not well.  On September 26 she again contacted the patient.  He said he  was still thinking about the recommendation of cardiac catheterization and  carotid ultrasound.  We asked him to call back should he decide to pursue  either test and scheduled him for follow-up appointment on November 26, 2005.  He subsequently cancelled that appointment.   Mr. Delay has not been compliant with any of my recommendations.  I  discussed this with Dr. Alwyn Ren today.     Salvadore Farber, MD    WED/MedQ  DD: 12/02/2005  DT: 12/02/2005   Job #: [id number]   cc:   Titus Dubin. Alwyn Ren, MD,FACP,FCCP

## 2010-06-29 NOTE — Op Note (Signed)
NAME:  [name], [name] NO.:  [id number]   MEDICAL RECORD NO.:  [id number]          PATIENT TYPE:  INP   LOCATION:  2899                         FACILITY:  [facility]   PHYSICIAN:  Quita Skye. Hart Rochester, M.D.  DATE OF BIRTH:  [birth year]   DATE OF PROCEDURE:  12/05/2003  DATE OF DISCHARGE:                                 OPERATIVE REPORT   PREOPERATIVE DIAGNOSIS:  Severe bilateral internal carotid stenoses,  asymptomatic.   POSTOPERATIVE DIAGNOSIS:  Severe bilateral internal carotid stenoses,  asymptomatic.   OPERATION:  Right carotid endarterectomy with Dacron patch angioplasty.   SURGEON:  Quita Skye. Hart Rochester, M.D.   FIRST ASSISTANT:  Pecola Leisure, PA   ANESTHESIA:  General endotracheal.   DRAINS:  One Jackson-Pratt.   COMPLICATIONS:  None.   BRIEF HISTORY:  This 70 year old gentleman was found to have bilateral  carotid bruits, and duplex scanning revealed a 90-95% bilateral internal  carotid stenoses, right greater than left.  He has no history of previous  stroke or neurologic symptoms and is scheduled for staged bilateral carotid  endarterectomy.   PROCEDURE:  The patient was taken to the operating room and placed in the  supine position, at which time satisfactory general endotracheal anesthesia  was administered.  The intubation was somewhat difficult but accomplished  without any complications.  Right neck was prepped with Betadine scrub and  solution, draped in a routine sterile manner.  Incision was made along the  anterior border of the sternocleidomastoid muscle and carried down through  subcutaneous tissue and platysma using the Bovie.  The common facial vein  and external jugular vein were ligated with 3-0 silk ties and divided.  The  common, internal, and external carotid arteries were dissected free and  encircled with vessel loops.  There was a severely degenerative  atherosclerotic plaque in the distal common carotid artery, extending up the  internal carotid artery about 2-3 cm.  The distal vessel appeared normal but  had a diminished pulse.  A #10 shunt was prepared and the patient was  heparinized.  The carotid vessels were occluded with vascular clamps, a  longitudinal opening made in the common carotid with a 15 blade, extended up  the internal carotid with the Potts scissors to a point distal to the  disease.  The plaque was severely ulcerative in nature and at least 95%  stenotic in severity and extended down the common carotid with some  ulceration in the intima, and the arteriotomy was extended proximal to this  point.  A shunt was inserted without difficulty, re-establishing flow in  about two minutes.  An endarterectomy was then performed using an elevator  and a Potts scissors with an eversion endarterectomy at the external  carotid.  The plaque feathered off the distal internal carotid artery  nicely, not requiring any tacking sutures.  The lumen was thoroughly  irrigated with heparin and saline and all loose debris carefully removed.  The arteriotomy was closed with a patch and continuous 6-0 Prolene.  Prior  to completion of the closure, the shunt was removed after about 35 minutes  of shunt time.  Following antegrade and retrograde flushing, the closure was  completed with re-establishment of flow initially up the external branch,  then up the internal branch.  The carotid was occluded for less than two  minutes for removal of the shunt.  Protamine was then given to reverse the  heparin.  Following adequate hemostasis, the wound was irrigated with saline  and closed in layers with Vicryl in a subcuticular fashion.  A Jackson-Pratt  drain was brought out through an inferiorly-based stab wound and secured  with a silk suture.  The patient was taken to the recovery room in satisfactory condition.       JDL/MEDQ  D:  12/05/2003  T:  12/05/2003  Job:  [id number]

## 2010-06-29 NOTE — Assessment & Plan Note (Signed)
[facility]                              CARDIOLOGY OFFICE NOTE   Martin, Mcdonald                       MRN:          [id number]  DATE:10/29/2005                            DOB:          03/30/1940    PRIMARY CARE PHYSICIAN:  Titus Dubin. Alwyn Ren, MD, FACP, FCCP   REASON FOR CONSULTATION:  Requested by Dr. Alwyn Ren to see [name] for  evaluation of syncope.   HISTORY OF PRESENT ILLNESS:  [name] is a 70 year old gentleman with  ischemic heart disease, status post myocardial infarction in the early  1980s, and balloon angioplasty of a vessel unknown to me in 1982.  He drinks  about 4 liquor drinks per night.  This pattern has been stable for a number  of years.  Over the past 2 weeks, he has had three episodes of severe  lightheadedness, one accompanied by witnessed syncope and a fall.  each  episode has occurred in the evening after drinking alcohol and eating.  Each  occurred when arising from the passenger seat of a car.  On one occasion, he  had to hold himself up on the hood of the car for several minutes to avoid  falling.  Most recently, on the night of  September 15, he arose from the  seat of his car.  He stool up and then fell abruptly backwards.  This was  witnessed by the patient's son.  The patient's son states that [name]  was acting much more drunk than usual in the car right prior to the episode.  He was slurring words.  He was, however, fully conscious.  His son states  that upon arising from the car, [name] appeared to lose consciousness  while standing and fall over without any attempt to catch himself.  He fell  backwards and hit his head on the concrete floor.  He was hospitalized at  the [facility] in the neurosciences ICU.  He was  found to have bilateral frontal intraparenchymal contusions.  He was also  noted to have subarachnoid hemorrhage in the right frontal region and a  small amount of subdural blood along the falx on the right.  Echocardiogram  reportedly demonstrated ejection fraction of 69% with normal regional wall  motion and no valvular abnormalities.  No comment is made on any arrhythmias  while he was on monitor during that hospitalization.   Mr. Matters tells me that in the period of several weeks while he has had  these falls, he has had no other symptoms.  He specifically denies any chest  discomfort with exertion or otherwise.  He has had no jaw or arm discomfort.  He has had no change in his stamina and feels his tennis game is unchanged.  He has not had any edema, orthopnea, PND or palpitations.   PAST MEDICAL HISTORY:  1. Status post bilateral carotid endarterectomies in 2005, by Dr. Hart Rochester.  2. Coronary disease, status post myocardial infarction and balloon      angioplasty in the 1980s, now EF normal.  3. History  of pyloric stenosis treated with surgery as a child.  4. Hypertension.  5. Hypercholesterolemia.  6. Alcohol abuse.   ALLERGIES:  No known drug allergies.   CURRENT MEDICATIONS:  1. Aspirin 81 mg per day held until today.  2. Hydrochlorothiazide 25 mg per day.  3. Lotrel 10/40 one per day.  4. Toprol XL 100 mg per day.  5. Crestor, dose not recalled by the patient.   SOCIAL HISTORY:  The patient is a Merchandiser, retail.  He enjoys tennis and  bridge.  He plays tennis at least once per week.  He is married with three  children.  He is accompanied by his wife and two sons today.  Previously  smoked, but quit in 1983.  Drinks at least four alcoholic beverages per day.  Denies prior DWIs.   FAMILY HISTORY:  Father died in his early 23s of cancer.  Mother died in her  early 58s of cancer.  The patient does not know any details.  Brother died  at 84 of cardiovascular disease.  Details not known.  A sister is alive and  well at 69.  His children are all healthy.   REVIEW OF SYSTEMS:  Wears glasses, otherwise negative in detail  except as  above.   PHYSICAL EXAMINATION:  GENERAL:  He is a somewhat disheveled man in no  distress.  VITAL SIGNS:  Heart rate 82, blood pressure 148/86.  Height 5 feet 10  inches, weight 201 pounds.  HEENT:  Remarkable for ecchymoses on the back of his head.  This is  moderately tender.  SKIN:  Normal.  MUSCULOSKELETAL:  Normal.  NECK:  No jugular venous distention, thyromegaly or lymphadenopathy.  Scars  consistent with prior carotid endarterectomies.  LUNGS:  Respiratory effort is normal.  Lungs are clear to auscultation.  CARDIAC:  He has a nondisplaced point of maximal cardiac impulse.  There is  a regular rate and rhythm without murmurs, rubs or gallops.  ABDOMEN:  Soft, nondistended, nontender.  There is no hepatosplenomegaly.  Bowel sounds are normal.  EXTREMITIES:  Warm without clubbing, cyanosis, edema or ulceration.  Carotid  pulses are 2+ bilaterally without prominent bruit on the right.  Femoral  pulses 2+ bilaterally.  NEUROLOGIC:  Alert and oriented x3.  Cranial nerves 2-12 grossly intact.  Strength and sensation are normal in all four extremities.   Electrocardiogram demonstrates normal sinus rhythm with ventricular  bigeminy.  There is T-wave inversion in lead aVL alone.  No evidence of  prior myocardial infarction.   IMPRESSION/RECOMMENDATIONS:  1. Syncope.  In this gentleman with ischemic heart disease, ventricular      bigeminy, I am concerned that his three episodes of syncope may be      related to ventricular tachycardia.  Providing some reassurance that      this not the case, is his preserved left ventricular systolic function      and lack of sudden death with three episodes.  Nonetheless, I have      recommended to proceeding to cardiac catheterization for definite      exclusion of severe coronary disease as a potential contributor.  I     recommend proceeding to this tomorrow.  The patient refuses.  He says      he will think about it, but refuses to  have it performed this week.  I      have let him know clearly of my disagreement with this decision.  The  patient tells me that Dr. Lovell Sheehan, of neurosurgery, saw him immediately      prior to my visit and informed him that it would be safe to resume the      aspirin.  I have no opinion on this as I am not privy to the CT scans      or to Dr. Lovell Sheehan' office records as he has just seen Dr. Lovell Sheehan.  I      have therefore asked the patient to follow the directions of Dr.      Lovell Sheehan without any input from myself regarding the aspirin.  I will be      in touch with Dr. Lovell Sheehan to understand his recommendation.  I will      also place a CardioNet monitor on him.  2. Carotid disease.  The patient has not followed up with Dr. Hart Rochester for      serial ultrasounds despite his recollection of Dr. Candie Chroman      recommendation to do so.  While I do not think carotid stenosis is      likely a contributor to his syncope, he does need screening ultrasounds      and we will go ahead and schedule this.  3. Hypertension, moderately elevated today.  Will make no changes in his      current medicines until we sort out the current issues.  4. Hypercholesterolemia.  No details on lipid status.  We will plan on      addressing this in the near future.                                 Salvadore Farber, MD    WED/MedQ  DD:  10/31/2005  DT:  11/01/2005  Job #:  [id number]   cc:   Titus Dubin. Alwyn Ren, MD,FACP,FCCP

## 2010-06-29 NOTE — Assessment & Plan Note (Signed)
[facility]                                   ON-CALL NOTE   BRYON, PARKER                         MRN:          [id number]  DATE:10/26/2005                            DOB:          29-May-1940    Trisha Mangle called from [phone].  He called at 8:02 a.m. on October 26, 2005, stating his dad is in [place], fell, was unconscious, and  cracked his skull.  He is in a hospital in [place] and they are asking  about transfer to [place].  Apparently, the patient has a neurosurgeon,  Dr. Newell Coral.  I recommended that the son call his service to see about  transfer, but stated that the transfer would have to happen from doctor to  doctor directly form [place] to [place] and he would have to go  through the doctor there to get a transfer done.                                   Lelon Perla, DO   YRL/MedQ  DD:  10/26/2005  DT:  10/27/2005  Job #:  [id number]   cc:   Titus Dubin. Alwyn Ren, MD,FACP,FCCP

## 2010-06-29 NOTE — H&P (Signed)
NAME:  [name], [name]                ACCOUNT NO.:  [id number]   MEDICAL RECORD NO.:  [id number]          PATIENT TYPE:  OIB   LOCATION:                               FACILITY:  [facility]   PHYSICIAN:  Quita Skye. Hart Rochester, M.D.  DATE OF BIRTH:  1940/10/21   DATE OF ADMISSION:  01/11/2004  DATE OF DISCHARGE:                                HISTORY & PHYSICAL   CHIEF COMPLAINT:  Severe left internal carotid stenosis, asymptomatic.   HISTORY OF PRESENT ILLNESS:  This 70 year old gentleman was recently found  to have severe bilateral carotid occlusive disease by his medical doctor,  Dr. Marga Melnick.  He has no history of specific hemispheric or non-  hemispheric TIAs, amaurosis fugax, diplopia, blurred vision, or syncope, or  previous stroke.  Duplex scanning revealed the right side to be greater than  95% in severity, and the left internal carotid to be 80 to 80% in severity.  He previously underwent a right carotid endarterectomy without difficulty on  October 24 with no complications and now returns to have his left carotid  endarterectomy performed.   PAST MEDICAL HISTORY:  1.  Coronary artery disease status post PTCA and stenting in 1979 and again      in 1983 in [place], [place], by Dr. Alanson Puls.  2.  History of hypertension treated medically.   Negative for diabetes mellitus, chronic obstructive pulmonary disease, or  stroke.   FAMILY HISTORY:  Positive for diabetes mellitus in maternal grandfather,  positive for stroke in a brother, and positive for myocardial infarction in  his father.   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  He has three children.  Has not smoked since 1983, drinks occasional alcohol.   REVIEW OF SYSTEMS:  Denies any lower extremity claudication symptoms, rest  pain, nonhealing ulcers.  Has no cardiac symptoms such as chest pain,  dyspnea on exertion, PND, or orthopnea.  No recent weight loss, anorexia,  change in general health status.  Has no  pulmonary, GI, or neurologic  symptoms.   PHYSICAL EXAMINATION:  VITAL SIGNS:  Blood pressure 180/100 in the left arm,  178/104 in the right arm.  Heart rate is 64 and regular.  Respirations 18.  GENERAL:  He is a healthy-appearing, middle-aged male in no apparent  distress.  He is alert and oriented x 3.  NECK:  Supple with 3+ carotid pulses.  Right neck incision is well healed  with no evidence of infection. No bruits audible on the right.  There is a  high-pitched bruit on the left.  No palpable adenopathy in the neck.  NEUROLOGIC:  Normal.  CARDIAC: Upper extremity pulses are 2 to 3+ at the brachial and radial  levels.  CHEST:  Clear to auscultation.  ABDOMEN:  Soft, nontender with no masses palpable.  EXTREMITIES:  Lower extremity exam reveals 3+ femoral, 2+ poplitea, 2+  posterior tibial pulses palpable bilaterally with no edema.   MEDICATIONS:  1.  Aspirin 81 mg per day.  2.  Norvasc 5 mg per day.  3.  Toprol XL 100 mg per day.  ALLERGIES:  None known.   IMPRESSION:  1.  Severe carotid occlusive disease status post right carotid      endarterectomy, now with severe left internal carotid stenosis,      asymptomatic.  2.  Coronary artery disease status post percutaneous transluminal cardiac      angioplasty and stenting in 1979 and 1983, doing well currently.   PLAN:  Admit for an elective left carotid endarterectomy on November 30 at  [facility].  Risks and benefits have been fully discussed with the  patient in the office, and he does desire to proceed with surgery.        ___________________________________________  Quita Skye Hart Rochester, M.D.    JDL/MEDQ  D:  12/27/2003  T:  12/27/2003  Job:  [id number]   cc:   Titus Dubin. Alwyn Ren, M.D. [facility]

## 2010-06-29 NOTE — Assessment & Plan Note (Signed)
[facility]                              CARDIOLOGY OFFICE NOTE   BARNES, FLOREK                       MRN:          [id number]  DATE:12/24/2005                            DOB:          [birth year]    [name] called to talk with me concerning his cardiac status and recent  syncope and recommendations per Dr. Samule Ohm.  We actually talked on December 24, 2005.  I reviewed Dr. Melinda Crutch notes and certainly agree with his  approach in recommending coronary angiography in this 70 year old gentleman  who had his first heart attack, I believe, in 1983, and has had bilateral  carotid endarterectomies and recent syncope.   I have emphasized the importance of catheterization to the patient and,  hopefully, he will reconsider and plan to proceed with the study as outlined  by Dr. Samule Ohm.     Cecil Cranker, MD, [facility]  Electronically Signed    EJL/MedQ  DD: 12/25/2005  DT: 12/25/2005  Job #: [id number]

## 2010-06-29 NOTE — Discharge Summary (Signed)
NAME:  [name], [name] NO.:  [id number]   MEDICAL RECORD NO.:  [id number]          PATIENT TYPE:  OIB   LOCATION:  2036                         FACILITY:  [facility]   PHYSICIAN:  Quita Skye. Hart Rochester, M.D.  DATE OF BIRTH:  [birth year]   DATE OF ADMISSION:  01/11/2004  DATE OF DISCHARGE:  01/12/2004                                 DISCHARGE SUMMARY   ADMISSION DIAGNOSIS:  Severe left internal carotid artery stenosis.   PAST MEDICAL HISTORY:  1.  Bilateral carotid artery disease, status post right carotid      endarterectomy on December 05, 2003, by Dr. Jerilee Field.  2.  Coronary artery disease, status post percutaneous transluminal coronary      angioplasty and stenting in 1979 and 1983, in [place], [place].  3.  Hypertension.   DISCHARGE DIAGNOSIS:  Severe left internal carotid artery stenosis,  asymptomatic, status post left carotid endarterectomy.   HISTORY:  [name] is a 70 year old Caucasian man.  He originally  presented to Dr. Hart Rochester in October 2005.  After evaluation by his medical  doctor, Dr. Alwyn Ren, revealed severe bilateral carotid occlusive disease.  He  was evaluated by Dr. Hart Rochester who recommended staged bilateral carotid  endarterectomies for stroke risk reduction.  [name] underwent his  right carotid endarterectomy on December 05, 2003, with no complications, and  returned to the office to consider having his left carotid endarterectomy  completed.  Dr. Hart Rochester did recommend proceeding with his left carotid  endarterectomy, and he was scheduled as an elective admission.   HOSPITAL COURSE:  On January 11, 2004, [name] was electively admitted  to [facility] under the care of Dr. Nigel Berthold. Hart Rochester.  He underwent the  following surgical procedure.  Left carotid endarterectomy with Dacron patch  angioplasty.  He tolerated this procedure well, transferring in stable  condition to the PACU.  He was extubated immediately following surgery and  awoke  from anesthesia neurologically intact.  He remained hemodynamically  stable in the immediate postoperative period.   On morning rounds on postoperative day #1, [name] reported feeling  very well and he was very anxious to be discharged home.  His vital signs  were stable.  His blood pressure was 159/78, he was afebrile, room air  saturation was 95%.  His heart was maintaining normal sinus rhythm, 70 beats  per minute.  His lungs were clear to auscultation.  As of morning rounds he  had not taken any solid food, but was not complaining of any nausea.  His  abdominal examination was benign.  He was voiding without difficulty after  removal of his Foley catheter.  His left neck incision is clean and dry,  there is no edema.  He remains neurologically intact.  He ambulated without  difficulty in the hallway.  His pain was adequately controlled.  Mr.  Mcdonald was ready for discharge home this morning, January 12, 2004.   LABORATORY DATA:  On January 12, 2004:  CBC:  White blood cell count 6.4,  hemoglobin 12.3, hematocrit 35.3, platelets 96.  Chemistries include a  sodium of 140, potassium 3.4, BUN 4, creatinine 1.2, glucose 128.   CONDITION ON DISCHARGE:  Improved.   DISCHARGE MEDICATIONS:  1.  He is to resume his home medications of Toprol XL 100 mg daily.  2.  Norvasc 5 mg daily.  3.  Aspirin 325 mg daily.  4.  Lipitor 40 mg daily.  5.  Multivitamin daily.  6.  For pain management, he may have Tylox one to two p.o. q.4h. p.r.n. for      moderate to severe pain.   ACTIVITY:  He has been asked to refrain from any driving or heavy lifting  for the next two weeks.   DIET:  Low fat, low salt diet.   WOUND CARE:  He is to clean his incision daily with soap and water.  He may  shower starting on January 14, 2004.  If his incision shows any sign of  infection, he is to call Dr. Candie Chroman office.   FOLLOWUP:  Dr. Hart Rochester would like to see him back at the [facility] office on  Tuesday,  January 31, 2004, at 9:40 a.m.       CTK/MEDQ  D:  01/12/2004  T:  01/12/2004  Job:  [id number]   cc:   Titus Dubin. Alwyn Ren, M.D. [facility]

## 2010-06-29 NOTE — Op Note (Signed)
NAME:  [name], [name] NO.:  [id number]   MEDICAL RECORD NO.:  [id number]          PATIENT TYPE:  OIB   LOCATION:  2899                         FACILITY:  [facility]   PHYSICIAN:  Quita Skye. Hart Rochester, M.D.  DATE OF BIRTH:  Jun 10, 1940   DATE OF PROCEDURE:  01/11/2004  DATE OF DISCHARGE:                                 OPERATIVE REPORT   PREOPERATIVE DIAGNOSIS:  Severe left internal carotid stenosis -  asymptomatic.   POSTOPERATIVE DIAGNOSIS:  Severe left internal carotid stenosis -  asymptomatic.   OPERATION:  Left carotid endarterectomy with Dacron patch angioplasty.   SURGEON:  Dr. Hart Rochester.   FIRST ASSISTANT:  Pecola Leisure, P.A.   ANESTHESIA:  General endotracheal.   BRIEF HISTORY:  This patient was found six weeks ago to have severe  bilateral carotid occlusive disease which was asymptomatic approaching 90%  in severity bilaterally.  He has previously undergo a right carotid  endarterectomy without difficulty, now returns to have the left side done.   PROCEDURE:  The patient was taken to the operating room, placed in the  supine position at which time satisfactory general endotracheal anesthesia  was administered.  The left neck was prepped with Betadine scrub and  solution, draped in the routine sterile manner.  The incision was made along  the anterior border of the sternocleidomastoid muscle and carried down  through the subcutaneous tissue and platysma using the Bovie.  The common  facial vein, external jugular veins were ligated with 3-0 silk ties and  divided, exposing the common internal and external carotid arteries.  Care  was taken not to injure the vagus or hypoglossal nerves, both of which were  exposed.  There was a calcified atherosclerotic plaque at the carotid  bifurcation and a plaque extending down proximally about 3-4 cm.  Distal  vessel appeared normal.  A #10 shunt was prepared and the patient was  heparinized.  The carotid vessels were  occluded with vascular clamps, a  longitudinal opening made in the common carotid with the 15 blade, extended  up the internal carotid with the Pott's scissors to a point distal to the  disease.  The plaque was about 90% stenotic in severity and was slightly  irregular and ulcerated in the common carotid.  The #10 shunt was inserted  without difficulty, re-establishing flow in about two minutes.  The standard  endarterectomy was then performed using the elevator and the Pott's scissors  with an eversion endarterectomy of the external carotid.  The plaque  feathered off the distal internal carotid artery nicely and not requiring  any tacking sutures.  The lumen was thoroughly irrigated with heparin and  saline and all loose debris carefully removed.  An arteriotomy was closed  with a patch using continuous 6-0 Prolene.  Prior to completion of the  closure, the shunt was removed after about 30 minutes of shunt time.  Following antegrade and retrograde flushing, the closure was completed.  Re-  establishment of flow in the external and internal branch.  The carotid was  occluded for less than two minutes with removal  of the  shunt.  Protamine was then given to reverse the heparin.  Following adequate  hemostasis, the wound was irrigated with saline, closed in layers with  Vicryl in a subcuticular fashion.  Sterile dressing was applied.  The  patient was taken to the recovery room in satisfactory condition.       JDL/MEDQ  D:  01/11/2004  T:  01/11/2004  Job:  [id number]

## 2010-07-12 ENCOUNTER — Other Ambulatory Visit: Payer: Self-pay

## 2010-07-12 MED ORDER — HYDROCHLOROTHIAZIDE 25 MG PO TABS: 25.0000 mg | ORAL_TABLET | Freq: Every day | ORAL | Status: DC

## 2010-08-14 ENCOUNTER — Other Ambulatory Visit: Payer: Self-pay | Admitting: Internal Medicine

## 2010-08-14 ENCOUNTER — Encounter: Payer: Self-pay | Admitting: Cardiovascular Disease

## 2010-08-14 MED ORDER — HYDROCHLOROTHIAZIDE 25 MG PO TABS: 25.0000 mg | ORAL_TABLET | Freq: Every day | ORAL | Status: DC

## 2010-08-14 NOTE — Telephone Encounter (Signed)
Called patient and schedule labs and f/u appointment. Crestor will be filled once labs reviewed

## 2010-08-23 ENCOUNTER — Other Ambulatory Visit (INDEPENDENT_AMBULATORY_CARE_PROVIDER_SITE_OTHER): Payer: Self-pay

## 2010-08-23 DIAGNOSIS — E785 Hyperlipidemia, unspecified: Secondary | ICD-10-CM

## 2010-08-23 DIAGNOSIS — T887XXA Unspecified adverse effect of drug or medicament, initial encounter: Secondary | ICD-10-CM

## 2010-08-23 DIAGNOSIS — I10 Essential (primary) hypertension: Secondary | ICD-10-CM

## 2010-08-23 DIAGNOSIS — IMO0001 Reserved for inherently not codable concepts without codable children: Secondary | ICD-10-CM

## 2010-08-23 LAB — BASIC METABOLIC PANEL
BUN: 25 mg/dL — ABNORMAL HIGH (ref 6–23)
CO2: 26 mEq/L (ref 19–32)
Calcium: 8.8 mg/dL (ref 8.4–10.5)
Chloride: 97 mEq/L (ref 96–112)
Creatinine, Ser: 1.3 mg/dL (ref 0.4–1.5)
GFR: 58.43 mL/min — ABNORMAL LOW (ref >60.00)
Glucose, Bld: 84 mg/dL (ref 70–99)
Potassium: 3.9 mEq/L (ref 3.5–5.1)
Sodium: 132 mEq/L — ABNORMAL LOW (ref 135–145)

## 2010-08-23 LAB — URINALYSIS
Bilirubin Urine: NEGATIVE
Hgb urine dipstick: NEGATIVE
Ketones, ur: NEGATIVE
Leukocytes, UA: NEGATIVE
Nitrite: NEGATIVE
Specific Gravity, Urine: 1.02 (ref 1.000–1.030)
Total Protein, Urine: NEGATIVE
Urine Glucose: NEGATIVE
Urobilinogen, UA: 1 (ref 0.0–1.0)
pH: 5.5 (ref 5.0–8.0)

## 2010-08-23 LAB — CBC WITH DIFFERENTIAL/PLATELET
Basophils Absolute: 0 10*3/uL (ref 0.0–0.1)
Basophils Relative: 0.6 % (ref 0.0–3.0)
Eosinophils Absolute: 0.2 10*3/uL (ref 0.0–0.7)
Eosinophils Relative: 4 % (ref 0.0–5.0)
HCT: 33.4 % — ABNORMAL LOW (ref 39.0–52.0)
Hemoglobin: 11.4 g/dL — ABNORMAL LOW (ref 13.0–17.0)
Lymphocytes Relative: 21.4 % (ref 12.0–46.0)
Lymphs Abs: 1.2 10*3/uL (ref 0.7–4.0)
MCHC: 34 g/dL (ref 30.0–36.0)
MCV: 102.9 fl — ABNORMAL HIGH (ref 78.0–100.0)
Monocytes Absolute: 0.5 10*3/uL (ref 0.1–1.0)
Monocytes Relative: 8.2 % (ref 3.0–12.0)
Neutro Abs: 3.6 10*3/uL (ref 1.4–7.7)
Neutrophils Relative %: 65.8 % (ref 43.0–77.0)
Platelets: 108 10*3/uL — ABNORMAL LOW (ref 150.0–400.0)
RBC: 3.25 Mil/uL — ABNORMAL LOW (ref 4.22–5.81)
RDW: 15.6 % — ABNORMAL HIGH (ref 11.5–14.6)
WBC: 5.5 10*3/uL (ref 4.5–10.5)

## 2010-08-23 LAB — HEPATIC FUNCTION PANEL
ALT: 15 U/L (ref 0–53)
AST: 17 U/L (ref 0–37)
Albumin: 3.8 g/dL (ref 3.5–5.2)
Alkaline Phosphatase: 82 U/L (ref 39–117)
Bilirubin, Direct: 0.2 mg/dL (ref 0.0–0.3)
Total Bilirubin: 1.1 mg/dL (ref 0.3–1.2)
Total Protein: 6.6 g/dL (ref 6.0–8.3)

## 2010-08-23 LAB — VITAMIN D 25 HYDROXY (VIT D DEFICIENCY, FRACTURES): Vit D, 25-Hydroxy: 49 ng/mL (ref 30–89)

## 2010-08-23 LAB — TSH: TSH: 0.51 u[IU]/mL (ref 0.35–5.50)

## 2010-08-23 LAB — LIPID PANEL
Cholesterol: 141 mg/dL (ref 0–200)
HDL: 64.5 mg/dL (ref >39.00)
LDL Cholesterol: 54 mg/dL (ref 0–99)
Total CHOL/HDL Ratio: 2
Triglycerides: 112 mg/dL (ref 0.0–149.0)
VLDL: 22.4 mg/dL (ref 0.0–40.0)

## 2010-08-23 LAB — PSA: PSA: 1.24 ng/mL (ref 0.10–4.00)

## 2010-08-28 ENCOUNTER — Ambulatory Visit (INDEPENDENT_AMBULATORY_CARE_PROVIDER_SITE_OTHER): Payer: Managed Care, Other (non HMO) | Admitting: Internal Medicine

## 2010-08-28 ENCOUNTER — Encounter: Payer: Self-pay | Admitting: Internal Medicine

## 2010-08-28 DIAGNOSIS — R7309 Other abnormal glucose: Secondary | ICD-10-CM

## 2010-08-28 DIAGNOSIS — D692 Other nonthrombocytopenic purpura: Secondary | ICD-10-CM

## 2010-08-28 DIAGNOSIS — D649 Anemia, unspecified: Secondary | ICD-10-CM

## 2010-08-28 DIAGNOSIS — I4891 Unspecified atrial fibrillation: Secondary | ICD-10-CM

## 2010-08-28 DIAGNOSIS — E785 Hyperlipidemia, unspecified: Secondary | ICD-10-CM

## 2010-08-28 DIAGNOSIS — N259 Disorder resulting from impaired renal tubular function, unspecified: Secondary | ICD-10-CM

## 2010-08-28 DIAGNOSIS — I10 Essential (primary) hypertension: Secondary | ICD-10-CM

## 2010-08-28 DIAGNOSIS — E559 Vitamin D deficiency, unspecified: Secondary | ICD-10-CM | POA: Insufficient documentation

## 2010-08-28 DIAGNOSIS — M109 Gout, unspecified: Secondary | ICD-10-CM | POA: Insufficient documentation

## 2010-08-28 LAB — URIC ACID: Uric Acid, Serum: 9.9 mg/dL — ABNORMAL HIGH (ref 4.0–7.8)

## 2010-08-28 LAB — CBC WITH DIFFERENTIAL/PLATELET
Basophils Absolute: 0 10*3/uL (ref 0.0–0.1)
Basophils Relative: 0.3 % (ref 0.0–3.0)
Eosinophils Absolute: 0.1 10*3/uL (ref 0.0–0.7)
Eosinophils Relative: 0.9 % (ref 0.0–5.0)
HCT: 34.7 % — ABNORMAL LOW (ref 39.0–52.0)
Hemoglobin: 11.6 g/dL — ABNORMAL LOW (ref 13.0–17.0)
Lymphocytes Relative: 14.4 % (ref 12.0–46.0)
Lymphs Abs: 1.1 10*3/uL (ref 0.7–4.0)
MCHC: 33.3 g/dL (ref 30.0–36.0)
MCV: 104.2 fl — ABNORMAL HIGH (ref 78.0–100.0)
Monocytes Absolute: 0.7 10*3/uL (ref 0.1–1.0)
Monocytes Relative: 9.1 % (ref 3.0–12.0)
Neutro Abs: 5.9 10*3/uL (ref 1.4–7.7)
Neutrophils Relative %: 75.3 % (ref 43.0–77.0)
Platelets: 126 10*3/uL — ABNORMAL LOW (ref 150.0–400.0)
RBC: 3.33 Mil/uL — ABNORMAL LOW (ref 4.22–5.81)
RDW: 15.4 % — ABNORMAL HIGH (ref 11.5–14.6)
WBC: 7.8 10*3/uL (ref 4.5–10.5)

## 2010-08-28 LAB — IBC PANEL
Iron: 88 ug/dL (ref 42–165)
Saturation Ratios: 21.2 % (ref 20.0–50.0)
Transferrin: 296.6 mg/dL (ref 212.0–360.0)

## 2010-08-28 LAB — FOLATE: Folate: >24.8 ng/mL (ref >5.9)

## 2010-08-28 LAB — VITAMIN B12: Vitamin B-12: 415 pg/mL (ref 211–911)

## 2010-08-28 MED ORDER — AMLODIPINE BESY-BENAZEPRIL HCL 10-40 MG PO CAPS: 1.0000 | ORAL_CAPSULE | Freq: Every day | ORAL | Status: DC

## 2010-08-28 MED ORDER — HYDROCHLOROTHIAZIDE 25 MG PO TABS: 25.0000 mg | ORAL_TABLET | Freq: Every day | ORAL | Status: DC

## 2010-08-28 MED ORDER — ROSUVASTATIN CALCIUM 20 MG PO TABS: ORAL_TABLET | ORAL | Status: DC

## 2010-08-28 MED ORDER — METOPROLOL SUCCINATE ER 100 MG PO TB24: 100.0000 mg | ORAL_TABLET | Freq: Every day | ORAL | Status: DC

## 2010-08-28 NOTE — Progress Notes (Signed)
Addended byPecola Lawless on: 08/28/2010 06:26 PM   Modules accepted: Orders

## 2010-08-28 NOTE — Patient Instructions (Addendum)
Decrease the Crestor to 20 mg 3 days a week. Blood Pressure Goal  Ideally is an AVERAGE < 135/85. This AVERAGE should be calculated from @ least 5-7 BP readings taken @ different times of day on different days of week. You should not respond to isolated BP readings , but rather the AVERAGE for that week Because of low platelet count, avoid excess aspirin , ibuprofen, naproxen etc .Repeat platelet count if  any abnormal bruising or bleeding occur. As per the Standard of Care , screening Colonoscopy recommended @ 50 & every 5-10 years thereafter . More frequent monitor would be dictated by family history or findings @ Colonoscopy .  I shall ask your  cardiologist to review this record; I recommended you  see him at least annually.

## 2010-08-28 NOTE — Progress Notes (Signed)
Subjective:    Patient ID: [name], male    DOB: [birth year], 70 y.o.   MRN: [id number]  HPI#1  HYPERTENSION: Disease Monitoring: Blood pressure range-110-130/70-80  Chest pain, palpitations- no       Dyspnea- no Medications: Compliance- yes  Lightheadedness,Syncope- no    Edema- no  #2 Fasting Hyperglycemia: Polyuria/phagia/dipsia- no       Visual problems- no Hypoglycemic symptoms- no FBS 84 ( 102 in 01/11   #3 HYPERLIPIDEMIA: Disease Monitoring: See symptoms for Hypertension Medications: Compliance- on Crestor 20 mg 4X/ week  Abd pain, bowel changes- see CBC: Hematocrit 33.4 (40 in 01/11). He denies abdominal pain, severe heartburn, dysphagia, melena, or rectal bleeding.   Muscle aches- no     PMH Smoking Status noted : quit 1983 Last colonoscopy :" never, I don't have have a good reason" ( SOC discussed again). He may have had a flexible sigmoidoscopy 20 years ago as an outpatient        Review of Systems he is not seen his cardiologist for several years. He does plan to go to [place] in near future.     Objective:   Physical Exam Gen.: Healthy and well-nourished in appearance. Alert, appropriate and cooperative throughout exam. Eyes: No corneal or conjunctival inflammation noted. Arcus Senilis. Mouth: Oral mucosa and oropharynx reveal no lesions or exudates. Teeth in good repair. Neck: No deformities, masses, or tenderness noted.  Thyroid normal. Lungs: Normal respiratory effort; chest expands symmetrically. Lungs are clear to auscultation without rales, wheezes, or increased work of breathing. Heart: Normal rate and irregular  rhythm. No gallop, click, or rub. No  murmur. Abdomen: Bowel sounds normal; abdomen soft and nontender. No masses, organomegaly or hernias noted.                                                                                   Musculoskeletal/extremities: No deformity or scoliosis noted of  the thoracic or lumbar spine. No  clubing, cyanosis, edema, or deformity noted. .Joints: gouty changes PIP L index finger; flexion 5th L finger  . Nail health  good. Vascular: Carotid, radial artery, dorsalis pedis and  posterior tibial pulses are full and equal. No bruits present. Neurologic: Alert and oriented x3. Deep tendon reflexes symmetrical and normal.          Skin: Intact without suspicious lesions or rashes. Lymph: No cervical, axillary  lymphadenopathy present. Psych: Mood and affect are normal. Normally interactive                                                                                         Assessment & Plan:  #1 hypertension, well controlled  #2 dyslipidemia, well controlled  #3 mild renal insufficiency, resolved  #4 mild hyperglycemia, resolved  #5 anemia, new. This is in the context of reduced platelet count  108,000. White count and differential are normal.  #6 vitamin D deficiency, corrected  Plan: CBC, iron panel, folate, and B12 levels to assess the anemia  Stool cards will be provided.I have encouraged him to reconsider a screening colonoscopy.

## 2010-08-29 ENCOUNTER — Telehealth: Payer: Self-pay | Admitting: Internal Medicine

## 2010-08-29 NOTE — Telephone Encounter (Signed)
Left message on voicemail to call the office (work).

## 2010-08-29 NOTE — Telephone Encounter (Addendum)
Dr.Hopper please advise on labs to be discussed with patient   Left message on voicemail for patient (@ work) informing him I received message and sent it to Dr. For further review

## 2010-08-29 NOTE — Telephone Encounter (Signed)
I called to inform patient of his appointment with [facility] for anemia, and he says he needs to know that his lab results confirmed anemia prior to accepting the GI referral appointment.

## 2010-08-29 NOTE — Telephone Encounter (Signed)
Repeat CBC did confirm anemia. GI referral is recommended to assess this.

## 2010-08-30 NOTE — Telephone Encounter (Signed)
Pt notified and will await call from [facility].

## 2010-09-21 ENCOUNTER — Ambulatory Visit: Payer: Managed Care, Other (non HMO) | Admitting: Gastroenterology

## 2010-10-08 ENCOUNTER — Encounter: Payer: Self-pay | Admitting: Internal Medicine

## 2010-10-08 ENCOUNTER — Ambulatory Visit (INDEPENDENT_AMBULATORY_CARE_PROVIDER_SITE_OTHER): Payer: Managed Care, Other (non HMO) | Admitting: Internal Medicine

## 2010-10-08 VITALS — BP 130/90 | HR 84 | Ht 70.0 in | Wt 195.0 lb

## 2010-10-08 DIAGNOSIS — D539 Nutritional anemia, unspecified: Secondary | ICD-10-CM

## 2010-10-08 DIAGNOSIS — D649 Anemia, unspecified: Secondary | ICD-10-CM

## 2010-10-08 DIAGNOSIS — K625 Hemorrhage of anus and rectum: Secondary | ICD-10-CM

## 2010-10-08 DIAGNOSIS — Z1211 Encounter for screening for malignant neoplasm of colon: Secondary | ICD-10-CM

## 2010-10-08 MED ORDER — PEG-KCL-NACL-NASULF-NA ASC-C 100 G PO SOLR: 1.0000 | Freq: Once | ORAL | Status: DC

## 2010-10-08 NOTE — Progress Notes (Signed)
HISTORY OF PRESENT ILLNESS:  [name] is a 70 y.o. male with multiple medical problems as listed below. He said today regarding anemia and the need for colonoscopy. The patient has not had prior screening colonoscopy. No family history of colon cancer. His only GI complaint is that of intermittent rectal bleeding once or twice per year over the past 20 years. This generally occurs after passing an uncomfortable bowel movement. The blood is described as bright and on the tissue. With regards to anemia, his hemoglobin last month was 11.6. His MCV was elevated at 104.2. Platelets decreased at 126,000. He has had an elevated MCV and sub-normal platelets for at least 4 years. Iron studies, B12, and folate were normal. He admits to drinking 3 alcoholic beverages per evening. No known history of liver disease. No family history of liver disease. Hepatic transaminases and alkaline phosphatase have been normal with the SGOT generally greater than the SGPT. Her bilirubin has been mildly elevated intermittently with a predominant indirect fraction. 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  REVIEW OF SYSTEMS:  All non-GI ROS negative except for irregular heart rate  Past Medical History  Diagnosis Date  . Hyperglycemia   . CAD (coronary artery disease)   . Hyperlipidemia   . Myocardial infarction     history of. 1983  . Chronic atrial fibrillation     Dr. Clifton James  . Anemia   . Renal insufficiency   . Gilbert's syndrome   . Hypertension   . Hemorrhoids     Past Surgical History  Procedure Date  . Cath/angioplasy     4098,1191, [facility] by Dr. Brett Canales [facility] of angioplasty)  . Carotid endarterectomy     bilaterally 2007  . Tonsillectomy   . No colonoscopy to date     ("I don't have a good excuse"). SOC reviewed  . Cataract extraction     Social History [name]  reports that he quit smoking about 29 years ago. His smoking use included Cigarettes. He smoked 3 packs per day.  He does not have any smokeless tobacco history on file. He reports that he drinks alcohol. He reports that he does not use illicit drugs.  family history includes Heart attack in his father.  There is no history of Colon cancer.  No Known Allergies     PHYSICAL EXAMINATION: Vital signs: BP 130/90  Pulse 84  Ht 5\' 10"  (1.778 m)  Wt 195 lb (88.451 kg)  BMI 27.98 kg/m2  Constitutional: generally well-appearing, no acute distress Psychiatric: alert and oriented x3, cooperative Eyes: extraocular movements intact, anicteric, conjunctiva pink Mouth: oral pharynx moist, no lesions. Normal tongue hue Neck: supple no lymphadenopathy Cardiovascular: irregular rate and rhythm, no murmur Lungs: clear to auscultation bilaterally Abdomen: soft, nontender, nondistended, no obvious ascites, no peritoneal signs, normal bowel sounds, no organomegaly Rectal: Deferred until colonoscopy Extremities: no lower extremity edema bilaterally Skin: no lesions on visible extremities Neuro: No focal deficits. No asterixis.    ASSESSMENT:  #1. Screening colonoscopy. Appropriate candidate without contraindication.The nature of the procedure, as well as the risks, benefits, and alternatives were carefully and thoroughly reviewed with the patient. Ample time for discussion and questions allowed. The patient understood, was satisfied, and agreed to proceed. Movi prep prescribed. The patient instructed on its use #2. Chronic infrequent minor intermittent rectal bleeding. Likely due to the fissure, based on description #3. Macrocytic anemia with thrombocytopenia. Possible alcohol effect. Rule out occult liver disease. May need hematology evaluation.  PLAN:  #1. Colonoscopy as above #2. Abdominal ultrasound to evaluate the liver and spleen #3. Advised to avoid alcohol use in excess #4. Consider hematology evaluation

## 2010-10-08 NOTE — Patient Instructions (Signed)
Colonoscopy [facility] 10/29/10 2:00 pm arrive at 1:00 pm Moviprep prescription sent to pharmacy. Colonoscopy brochure given for you to review Abdominal ultrasound scheduled at [facility] 11/02/10 at 9:00 am arrive at 8:45 am    Nothing to eat or drink 6 hours prior to test.

## 2010-10-29 ENCOUNTER — Ambulatory Visit (AMBULATORY_SURGERY_CENTER): Payer: Managed Care, Other (non HMO) | Admitting: Internal Medicine

## 2010-10-29 ENCOUNTER — Encounter: Payer: Self-pay | Admitting: Internal Medicine

## 2010-10-29 VITALS — BP 146/95 | HR 109 | Temp 97.1°F | Resp 16 | Ht 70.0 in | Wt 195.0 lb

## 2010-10-29 DIAGNOSIS — K5730 Diverticulosis of large intestine without perforation or abscess without bleeding: Secondary | ICD-10-CM

## 2010-10-29 DIAGNOSIS — Z1211 Encounter for screening for malignant neoplasm of colon: Secondary | ICD-10-CM

## 2010-10-29 MED ORDER — SODIUM CHLORIDE 0.9 % IV SOLN: 500.0000 mL | INTRAVENOUS | Status: DC

## 2010-10-29 NOTE — Progress Notes (Signed)
Patient states that sometimes he "passes out" at home.   He states that sometime he "falls"  But doesn't "hurt " himself.   He is concerned about his blood pressure, and it is fine today.   He has an appointment with his cardiologist tomorrow at 12 noon.  He assures me that he will keep the appointment.   I will send a HR strip with the patient today for cardiology.   Patient is still in a-fib at discharge, but denies any pain nor headache.  Patient was discharged with his son VSS.

## 2010-10-29 NOTE — Progress Notes (Signed)
Patient's heart rhythm Afib with rate of 100 at baseline. 1427 bp 85/34, IV flush rate. Dr. Marina Goodell aware of vital signs. Patient will arouse to namecall, skin warm, dry and pink. 1432 90/ 51, heart rate 119, skin warm dry and pink, easily aroused, no complaints from the patient.

## 2010-10-30 ENCOUNTER — Telehealth: Payer: Self-pay | Admitting: *Deleted

## 2010-10-30 ENCOUNTER — Ambulatory Visit (INDEPENDENT_AMBULATORY_CARE_PROVIDER_SITE_OTHER): Payer: Managed Care, Other (non HMO) | Admitting: Cardiovascular Disease

## 2010-10-30 ENCOUNTER — Encounter: Payer: Self-pay | Admitting: Cardiovascular Disease

## 2010-10-30 VITALS — BP 118/62 | HR 84 | Resp 16 | Ht 69.0 in | Wt 193.0 lb

## 2010-10-30 DIAGNOSIS — I2510 Atherosclerotic heart disease of native coronary artery without angina pectoris: Secondary | ICD-10-CM

## 2010-10-30 DIAGNOSIS — I779 Disorder of arteries and arterioles, unspecified: Secondary | ICD-10-CM | POA: Insufficient documentation

## 2010-10-30 DIAGNOSIS — I4891 Unspecified atrial fibrillation: Secondary | ICD-10-CM

## 2010-10-30 MED ORDER — DABIGATRAN ETEXILATE MESYLATE 150 MG PO CAPS: 150.0000 mg | ORAL_CAPSULE | Freq: Two times a day (BID) | ORAL | Status: DC

## 2010-10-30 NOTE — Assessment & Plan Note (Signed)
Stable. No chest pain. Continue current medical therapy.

## 2010-10-30 NOTE — Telephone Encounter (Signed)

## 2010-10-30 NOTE — Assessment & Plan Note (Signed)
Rate controlled. Continue Toprol XL. Will start anticoagulation with Pradaxa 150 mg po BID. Follow up CBC in one month.

## 2010-10-30 NOTE — Progress Notes (Signed)
History of Present Illness:[name] is a pleasant 70 year old Caucasian male with a past medical history significant for atrial fibrillation, hypertension, hyperlipidemia, coronary artery disease  status post MI, and balloon angioplasty in 1980s, as well as peripheral vascular disease with bilateral carotid endarterectomies in 2005, who is here today for cardiac followup. He was last seen in our office in September of 2010.  He had been on coumadin in the past but he stopped taking because of a rash and did not wish to restart. He has refused cardioversion or ablation in the past. He has never been aware of his irregular heart rhythm. He has also refused to escalate his medical therapy with antiarrythmic drugs.   He was seen by GI yesterday and noted to be in continued atrial fibrillation which is chronic for him. He apparently had some dizziness two years ago but has been feeling well lately. No dizziness, chest pain or SOB for two years. He is now willing to try anticoagulation with Pradaxa. He has had no follow up of his carotid artery disease in over 7 years.    Past Medical History  Diagnosis Date  . Hyperglycemia   . CAD (coronary artery disease)   . Hyperlipidemia   . Myocardial infarction     history of. 1983  . Chronic atrial fibrillation     Dr. Clifton James  . Anemia   . Renal insufficiency   . Gilbert's syndrome   . Hypertension   . Hemorrhoids   . Cataract     BILATERAL    Past Surgical History  Procedure Date  . Cath/angioplasy     [id number], [facility] by Dr. Brett Canales [facility] of angioplasty)  . Carotid endarterectomy     bilaterally 2007  . Tonsillectomy   . No colonoscopy to date     ("I don't have a good excuse"). SOC reviewed  . Cataract extraction     Current Outpatient Prescriptions  Medication Sig Dispense Refill  . amLODipine-benazepril (LOTREL) 10-40 MG per capsule Take 1 capsule by mouth daily.  90 capsule  3  . aspirin 81 MG tablet Take 81 mg by mouth  daily.        . hydrochlorothiazide 25 MG tablet Take 1 tablet (25 mg total) by mouth daily.  90 tablet  3  . indomethacin (INDOCIN) 50 MG capsule Take 50 mg by mouth 2 (two) times daily as needed.        . metoprolol (TOPROL-XL) 100 MG 24 hr tablet Take 1 tablet (100 mg total) by mouth daily.  90 tablet  3  . rosuvastatin (CRESTOR) 20 MG tablet Take 20 mg by mouth as directed. Take 1 tab 2x/week       Current Facility-Administered Medications  Medication Dose Route Frequency Provider Last Rate Last Dose  . DISCONTD: 0.9 %  sodium chloride infusion  500 mL Intravenous Continuous Yancey Flemings, MD        No Known Allergies  History   Social History  . Marital Status: Married    Spouse Name: N/A    Number of Children: 3  . Years of Education: N/A   Occupational History  . Stockbroker    Social History Main Topics  . Smoking status: Former Smoker -- 3.0 packs/day    Types: Cigarettes    Quit date: 02/11/1981  . Smokeless tobacco: Never Used  . Alcohol Use: Yes  . Drug Use: No  . Sexually Active: Not on file   Other Topics Concern  . Not on file  Social History Narrative  . No narrative on file    Family History  Problem Relation Age of Onset  . Heart attack Father   . Heart disease Father   . Colon cancer Neg Hx     Review of Systems:  As stated in the HPI and otherwise negative.   BP 118/62  Pulse 84  Resp 16  Ht 5\' 9"  (1.753 m)  Wt 193 lb (87.544 kg)  BMI 28.50 kg/m2  Physical Examination: General: Well developed, well nourished, NAD HEENT: OP clear, mucus membranes moist SKIN: warm, dry. No rashes. Neuro: No focal deficits Musculoskeletal: Muscle strength 5/5 all ext Psychiatric: Mood and affect normal Neck: No JVD, no carotid bruits, no thyromegaly, no lymphadenopathy. Lungs:Clear bilaterally, no wheezes, rhonci, crackles Cardiovascular: Irregular  rate and rhythm. No murmurs, gallops or rubs. Abdomen:Soft. Bowel sounds present. Non-tender.    Extremities: No lower extremity edema. Pulses are 2 + in the bilateral DP/PT.

## 2010-10-30 NOTE — Assessment & Plan Note (Signed)
He has had no follow up over the last seven years. Slight bruit right carotid. I have offered him carotid artery dopplers for surveillance but he wishes to follow up with Dr. Hart Rochester. He will contact Dr. Hart Rochester.

## 2010-10-30 NOTE — Patient Instructions (Signed)
Your physician wants you to follow-up in: 6 months. You will receive a reminder letter in the mail two months in advance. If you don't receive a letter, please call our office to schedule the follow-up appointment.  Your physician recommends that you return for lab work in one month--CBC  Your physician has recommended you make the following change in your medication: Start Pradaxa 150 mg by mouth twice daily--12 hours apart.

## 2010-11-02 ENCOUNTER — Telehealth: Payer: Self-pay

## 2010-11-02 ENCOUNTER — Ambulatory Visit (HOSPITAL_COMMUNITY)
Admission: RE | Admit: 2010-11-02 | Discharge: 2010-11-02 | Disposition: A | Discharge status: Home / Self Care | Payer: Managed Care, Other (non HMO) | Source: Ambulatory Visit | Attending: Internal Medicine | Admitting: Internal Medicine

## 2010-11-02 DIAGNOSIS — I714 Abdominal aortic aneurysm, without rupture, unspecified: Secondary | ICD-10-CM | POA: Insufficient documentation

## 2010-11-02 DIAGNOSIS — D649 Anemia, unspecified: Secondary | ICD-10-CM | POA: Insufficient documentation

## 2010-11-02 NOTE — Telephone Encounter (Signed)
Pt aware. Pt states that if it is critical and necessary then he will proceed with the hematology referral, it if is optional and preventative he states that he is "doctored out" at this time and would like to take a break. Dr. Marina Goodell please advise.

## 2010-11-02 NOTE — Telephone Encounter (Signed)
This is not preventative. I want to be sure that he does not have a significant other serious bone marrow problem, such as bone marrow cancer. The opinion of the hematologist, in this regard, would be welcomed. I appreciate him proceeding with hematology evaluation. I would be happy to discuss this with him personally or he can discuss this further with Dr. Alwyn Ren

## 2010-11-02 NOTE — Telephone Encounter (Signed)
Message copied by Michele Mcalpine on Fri Nov 02, 2010  1:27 PM ------      Message from: Hilarie Fredrickson      Created: Fri Nov 02, 2010 12:32 PM       Bonita Quin, Please let Mr. Haas know that his ultrasound looks normal, particularly the liver and spleen. I would recommend a hematology evaluation for his macrocytic anemia and thrombocytopenia. Please arrange, if he is willing. Let patient know that, I will forward this information to Dr. Alwyn Ren as well. Thanks

## 2010-11-06 NOTE — Telephone Encounter (Signed)
Pt called back and wishes to proceed with the referral to the hematologist. Records faxed to [facility] requesting an appt with Dr. Cyndie Chime.

## 2010-11-06 NOTE — Telephone Encounter (Signed)
Left message for pt to call back  °

## 2010-11-07 ENCOUNTER — Encounter: Payer: Managed Care, Other (non HMO) | Admitting: Oncology

## 2010-11-16 ENCOUNTER — Telehealth: Payer: Self-pay | Admitting: Internal Medicine

## 2010-11-16 NOTE — Telephone Encounter (Signed)
Called patient to schedule lab appt he said i'm anemic because everyone keeps taking my blood - he said he has a lab appt next week with another office -he will also be seeing dr Cyndie Chime -

## 2010-11-16 NOTE — Telephone Encounter (Signed)
Message copied by Mikey Bussing on Fri Nov 16, 2010  8:16 AM ------      Message from: Blain Pais T      Created: Wed Nov 07, 2010 11:38 AM                   ----- Message -----         From: Pecola Lawless, MD         Sent: 11/03/2010   7:06 AM           To: Floydene Flock, Zada Finders Hoff            Please  schedule  Labs : folate, B12, IBC, CBC & dif ( TCP, macrocytosis)

## 2010-11-28 ENCOUNTER — Other Ambulatory Visit: Payer: Self-pay | Admitting: Oncology

## 2010-11-28 ENCOUNTER — Encounter: Payer: Self-pay | Admitting: Cardiovascular Disease

## 2010-11-28 ENCOUNTER — Encounter (HOSPITAL_BASED_OUTPATIENT_CLINIC_OR_DEPARTMENT_OTHER): Payer: Managed Care, Other (non HMO) | Admitting: Oncology

## 2010-11-28 DIAGNOSIS — D539 Nutritional anemia, unspecified: Secondary | ICD-10-CM

## 2010-11-28 DIAGNOSIS — D696 Thrombocytopenia, unspecified: Secondary | ICD-10-CM

## 2010-11-28 DIAGNOSIS — F1020 Alcohol dependence, uncomplicated: Secondary | ICD-10-CM

## 2010-11-28 DIAGNOSIS — C20 Malignant neoplasm of rectum: Secondary | ICD-10-CM

## 2010-11-28 LAB — CBC & DIFF AND RETIC
Basophils Absolute: 0 10*3/uL (ref 0.0–0.1)
EOS%: 2.1 % (ref 0.0–7.0)
HCT: 33.6 % — ABNORMAL LOW (ref 38.4–49.9)
HGB: 11.4 g/dL — ABNORMAL LOW (ref 13.0–17.1)
LYMPH%: 16.2 % (ref 14.0–49.0)
MCH: 33.7 pg — ABNORMAL HIGH (ref 27.2–33.4)
MCV: 99.4 fL — ABNORMAL HIGH (ref 79.3–98.0)
MONO%: 10.8 % (ref 0.0–14.0)
NEUT%: 70.4 % (ref 39.0–75.0)
Platelets: 128 10*3/uL — ABNORMAL LOW (ref 140–400)

## 2010-11-28 LAB — MORPHOLOGY: PLT EST: DECREASED

## 2010-11-29 ENCOUNTER — Other Ambulatory Visit: Payer: Managed Care, Other (non HMO) | Admitting: *Deleted

## 2010-11-29 LAB — COMPREHENSIVE METABOLIC PANEL
ALT: 11 U/L (ref 0–53)
AST: 17 U/L (ref 0–37)
CO2: 23 mEq/L (ref 19–32)
Calcium: 9 mg/dL (ref 8.4–10.5)
Chloride: 96 mEq/L (ref 96–112)
Creatinine, Ser: 1.66 mg/dL — ABNORMAL HIGH (ref 0.50–1.35)
Potassium: 4.1 mEq/L (ref 3.5–5.3)
Sodium: 133 mEq/L — ABNORMAL LOW (ref 135–145)
Total Protein: 6.7 g/dL (ref 6.0–8.3)

## 2010-11-29 LAB — IRON AND TIBC: %SAT: 21 % (ref 20–55)

## 2010-11-29 LAB — FERRITIN: Ferritin: 201 ng/mL (ref 22–322)

## 2010-11-29 LAB — LACTATE DEHYDROGENASE: LDH: 162 U/L (ref 94–250)

## 2010-11-29 LAB — ERYTHROPOIETIN: Erythropoietin: 16.8 m[IU]/mL (ref 2.6–34.0)

## 2011-02-14 ENCOUNTER — Telehealth: Payer: Self-pay | Admitting: Cardiovascular Disease

## 2011-02-14 NOTE — Telephone Encounter (Signed)
msg left to bring paper work with orders of what Dr Alwyn Ren wants and Dr Sanjuana Kava will add what he needs. Told to call back with any other questions or concerns.

## 2011-02-14 NOTE — Telephone Encounter (Signed)
New Problem:    Patient would like to do all of the blood work for Dr. Alwyn Ren and Dr. Clifton James here on his next visit. Please advise.

## 2011-02-18 ENCOUNTER — Telehealth: Payer: Self-pay | Admitting: Internal Medicine

## 2011-02-18 NOTE — Telephone Encounter (Signed)
Dr.Hopper please advise 

## 2011-02-18 NOTE — Telephone Encounter (Signed)
Patient calling, states he has an appt with Dr. Clifton James, and to have labs at his office on 04-16-2011.  States that [place] told patient to contact Dr. Alwyn Ren and have him to enter & give to patient what labs he would like patient to have, since he is having blood drawn on that day.  Please advise.

## 2011-02-25 DIAGNOSIS — I714 Abdominal aortic aneurysm, without rupture, unspecified: Secondary | ICD-10-CM

## 2011-02-25 HISTORY — DX: Abdominal aortic aneurysm, without rupture: I71.4

## 2011-02-25 HISTORY — DX: Abdominal aortic aneurysm, without rupture, unspecified: I71.40

## 2011-02-28 ENCOUNTER — Telehealth: Payer: Self-pay | Admitting: Internal Medicine

## 2011-02-28 NOTE — Telephone Encounter (Signed)
Please schedule fasting Labs as requested (see phone note) : BMET,Lipids, hepatic panel, CBC & dif,A1c, TSH.Codes: 285.9, thrombocytopenia, renal insufficiency, 790.29, 277.7, 995.20.     Left message for patient to return my call so we can schedule fasting labs

## 2011-03-06 ENCOUNTER — Telehealth: Payer: Self-pay | Admitting: Internal Medicine

## 2011-03-06 MED ORDER — COLCHICINE 0.6 MG PO TABS: 0.6000 mg | ORAL_TABLET | Freq: Every day | ORAL | Status: DC

## 2011-03-06 NOTE — Telephone Encounter (Signed)
The pt called and is requesting a call back.  He refused to tell me the nature of why he was calling.  Call back - [phone].  Thanks!

## 2011-03-06 NOTE — Telephone Encounter (Signed)
Unfortunately there is no generic Colcrys available in the [place]; brand can be filled if desired

## 2011-03-06 NOTE — Telephone Encounter (Signed)
Rx sent to pharmacy   

## 2011-03-06 NOTE — Telephone Encounter (Signed)
Spoke with patient, patient would like orders for labs faxed to him @ [phone]. (Copied note from 02/28/11 and faxed), patient also requested rx for generic Colcrys to be sent to [facility].  Last Uric Acid level 9.9 in July 2012, Dr.Hopper please advise (this med is not on med list, checked Production manager)

## 2011-04-02 ENCOUNTER — Ambulatory Visit (INDEPENDENT_AMBULATORY_CARE_PROVIDER_SITE_OTHER): Payer: Managed Care, Other (non HMO) | Admitting: Internal Medicine

## 2011-04-02 ENCOUNTER — Encounter: Payer: Self-pay | Admitting: Internal Medicine

## 2011-04-02 DIAGNOSIS — I10 Essential (primary) hypertension: Secondary | ICD-10-CM

## 2011-04-02 DIAGNOSIS — M109 Gout, unspecified: Secondary | ICD-10-CM

## 2011-04-02 LAB — BUN: BUN: 29 mg/dL — ABNORMAL HIGH (ref 6–23)

## 2011-04-02 LAB — URIC ACID: Uric Acid, Serum: 9.7 mg/dL — ABNORMAL HIGH (ref 4.0–7.8)

## 2011-04-02 LAB — CREATININE, SERUM: Creatinine, Ser: 1.9 mg/dL — ABNORMAL HIGH (ref 0.4–1.5)

## 2011-04-02 NOTE — Progress Notes (Signed)
  Subjective:    Patient ID: [name], male    DOB: March 13, 1940, 71 y.o.   MRN: [id number]  HPI For several months he has had fairly constant discomfort in the left index finger with intermittent sharp pain. He he has had a clinical diagnosis of gout but no urate crystals have been isolated from any joint. Remotely a podiatrist injected a toe most likely with steroids. Sustained release indomethacin has been of some benefit  Apparently he has been on Colcrys; remotely he was on a generic colchicine which is no longer available in the [place] Significantly he is on hydrochlorothiazide 25 mg for hypertension; he is no longer on low-dose aspirin 81 mg  Review of Systems  He has not had recent lipids to evaluate triglyceride level. Also there is no recent uric acid I     Objective:   Physical Exam  He is in no distress and does not appear to be in discomfort.  Pulses are intact without deficit. He does have varicosities over the lower extremities with trace pitting edema.  Nail health appears to be good.  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        Assessment & Plan:

## 2011-04-02 NOTE — Assessment & Plan Note (Signed)
Blood pressure is well controlled. HCTZ is not expected to be effective with renal insufficiency and could exacerbate gout by raising uric acid. It will be discontinued with pressure monitor

## 2011-04-02 NOTE — Patient Instructions (Addendum)
The most common cause of elevated triglycerides AND uric acid is the ingestion of sugar from high fructose corn syrup sources added to processed foods & drinks.  Eat a low-fat diet with lots of fruits and vegetables, up to 7-9 servings per day. Consume less than 40 (preferably ZERO) grams of sugar per day from foods & drinks with High Fructose Corn Syrup (HFCS) sugar as #1,2,3 or # 4 on label.Whole Foods, [employer] & [employer] do not carry products with HFCS. Follow a  low carb nutrition program such as [place] or The [employer]  to prevent Diabetes progression . White carbohydrates (potatoes, rice, bread, and pasta) have a high spike of sugar and a high load of sugar. For example a  baked potato has a cup of sugar and a  french fry  2 teaspoons of sugar. Yams, wild  rice, whole grained bread &  wheat pasta have been much lower spike and load of  sugar. Portions should be the size of a deck of cards or your palm. Monitor blood pressure off HCTZ.Blood Pressure Goal  Ideally is an AVERAGE < 135/85. This AVERAGE should be calculated from @ least 5-7 BP readings taken @ different times of day on different days of week. You should not respond to isolated BP readings , but rather the AVERAGE for that week

## 2011-04-02 NOTE — Assessment & Plan Note (Signed)
He has fasting labs schedule in the future; it is imperative to know the level of his uric acid.To prevent gout the minimal uric acid goal is < 7; preferred is < 6, ideally < 5 to prevent gout.

## 2011-04-05 ENCOUNTER — Telehealth: Payer: Self-pay | Admitting: Internal Medicine

## 2011-04-05 ENCOUNTER — Encounter: Payer: Self-pay | Admitting: Physician Assistant

## 2011-04-05 ENCOUNTER — Ambulatory Visit (INDEPENDENT_AMBULATORY_CARE_PROVIDER_SITE_OTHER): Payer: Managed Care, Other (non HMO) | Admitting: Physician Assistant

## 2011-04-05 ENCOUNTER — Inpatient Hospital Stay (HOSPITAL_COMMUNITY)
Admission: AD | Admit: 2011-04-05 | Discharge: 2011-04-08 | DRG: 378 | Disposition: A | Discharge status: Home / Self Care | Payer: Managed Care, Other (non HMO) | Source: Ambulatory Visit | Attending: Gastroenterology | Admitting: Gastroenterology

## 2011-04-05 VITALS — BP 154/62 | HR 88 | Ht 69.0 in | Wt 197.0 lb

## 2011-04-05 DIAGNOSIS — K264 Chronic or unspecified duodenal ulcer with hemorrhage: Secondary | ICD-10-CM | POA: Diagnosis present

## 2011-04-05 DIAGNOSIS — K922 Gastrointestinal hemorrhage, unspecified: Secondary | ICD-10-CM | POA: Diagnosis not present

## 2011-04-05 DIAGNOSIS — I252 Old myocardial infarction: Secondary | ICD-10-CM

## 2011-04-05 DIAGNOSIS — K260 Acute duodenal ulcer with hemorrhage: Secondary | ICD-10-CM | POA: Diagnosis not present

## 2011-04-05 DIAGNOSIS — K921 Melena: Secondary | ICD-10-CM

## 2011-04-05 DIAGNOSIS — K2961 Other gastritis with bleeding: Secondary | ICD-10-CM | POA: Diagnosis present

## 2011-04-05 DIAGNOSIS — I2510 Atherosclerotic heart disease of native coronary artery without angina pectoris: Secondary | ICD-10-CM | POA: Diagnosis present

## 2011-04-05 DIAGNOSIS — M109 Gout, unspecified: Secondary | ICD-10-CM | POA: Diagnosis present

## 2011-04-05 DIAGNOSIS — Z7901 Long term (current) use of anticoagulants: Secondary | ICD-10-CM | POA: Diagnosis not present

## 2011-04-05 DIAGNOSIS — I4891 Unspecified atrial fibrillation: Secondary | ICD-10-CM | POA: Diagnosis not present

## 2011-04-05 DIAGNOSIS — D62 Acute posthemorrhagic anemia: Secondary | ICD-10-CM | POA: Diagnosis not present

## 2011-04-05 DIAGNOSIS — D689 Coagulation defect, unspecified: Secondary | ICD-10-CM

## 2011-04-05 DIAGNOSIS — T3995XA Adverse effect of unspecified nonopioid analgesic, antipyretic and antirheumatic, initial encounter: Secondary | ICD-10-CM | POA: Diagnosis present

## 2011-04-05 DIAGNOSIS — I10 Essential (primary) hypertension: Secondary | ICD-10-CM | POA: Diagnosis present

## 2011-04-05 LAB — PREPARE RBC (CROSSMATCH)

## 2011-04-05 LAB — CBC
MCH: 34.4 pg — ABNORMAL HIGH (ref 26.0–34.0)
Platelets: 140 10*3/uL — ABNORMAL LOW (ref 150–400)
RBC: 2.59 MIL/uL — ABNORMAL LOW (ref 4.22–5.81)

## 2011-04-05 LAB — COMPREHENSIVE METABOLIC PANEL
ALT: 12 U/L (ref 0–53)
AST: 18 U/L (ref 0–37)
Alkaline Phosphatase: 78 U/L (ref 39–117)
CO2: 23 mEq/L (ref 19–32)
Calcium: 9 mg/dL (ref 8.4–10.5)
GFR calc non Af Amer: 40 mL/min — ABNORMAL LOW (ref >90)
Potassium: 4 mEq/L (ref 3.5–5.1)
Sodium: 132 mEq/L — ABNORMAL LOW (ref 135–145)
Total Protein: 6.5 g/dL (ref 6.0–8.3)

## 2011-04-05 MED ORDER — ACETAMINOPHEN 325 MG PO TABS: 650.0000 mg | ORAL_TABLET | Freq: Four times a day (QID) | ORAL | Status: DC | PRN

## 2011-04-05 MED ORDER — ONDANSETRON HCL 4 MG/2ML IJ SOLN: 4.0000 mg | Freq: Four times a day (QID) | INTRAMUSCULAR | Status: DC | PRN

## 2011-04-05 MED ORDER — ACETAMINOPHEN 650 MG RE SUPP: 650.0000 mg | Freq: Four times a day (QID) | RECTAL | Status: DC | PRN

## 2011-04-05 MED ORDER — ACETAMINOPHEN 325 MG PO TABS
650.0000 mg | ORAL_TABLET | Freq: Once | ORAL | Status: AC
  Administered 2011-04-06: 650 mg via ORAL
  Filled 2011-04-05: qty 2

## 2011-04-05 MED ORDER — METOPROLOL SUCCINATE ER 100 MG PO TB24
100.0000 mg | ORAL_TABLET | Freq: Every day | ORAL | Status: DC
  Administered 2011-04-06 – 2011-04-08 (×3): 100 mg via ORAL
  Filled 2011-04-05 (×3): qty 1

## 2011-04-05 MED ORDER — PANTOPRAZOLE SODIUM 40 MG IV SOLR
40.0000 mg | Freq: Two times a day (BID) | INTRAVENOUS | Status: DC
  Administered 2011-04-05 – 2011-04-07 (×5): 40 mg via INTRAVENOUS
  Filled 2011-04-05 (×7): qty 40

## 2011-04-05 MED ORDER — HYDROCODONE-ACETAMINOPHEN 5-325 MG PO TABS: 1.0000 | ORAL_TABLET | ORAL | Status: DC | PRN

## 2011-04-05 MED ORDER — ONDANSETRON HCL 4 MG PO TABS: 4.0000 mg | ORAL_TABLET | Freq: Four times a day (QID) | ORAL | Status: DC | PRN

## 2011-04-05 MED ORDER — KCL IN DEXTROSE-NACL 10-5-0.45 MEQ/L-%-% IV SOLN
INTRAVENOUS | Status: DC
  Administered 2011-04-05 – 2011-04-08 (×3): via INTRAVENOUS
  Filled 2011-04-05 (×9): qty 1000

## 2011-04-05 NOTE — Telephone Encounter (Signed)
Patient requesting last lab results from earlier this week. Call work #

## 2011-04-05 NOTE — Telephone Encounter (Signed)
Opened in error

## 2011-04-05 NOTE — Progress Notes (Signed)
Subjective:    Patient ID: [name], male    DOB: 07/16/1940, 71 y.o.   MRN: [id number]  HPI K. is a pleasant 71 year old white male known to Dr. Yancey Flemings primary  patient of Dr. Alwyn Ren with history of chronic atrial fibrillation for which he is on Pradaxa and aspirin generally. He also has history of coronary artery disease, he is status post MI has history of hypertension and prior carotid endarterectomies. He underwent colonoscopy here in September of 2012 which showed moderate sigmoid diverticulosis and was otherwise negative exam. 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 He had abdominal ultrasound done last fall as well which was unremarkable and had been referred to hematology.  Last hemoglobin in October of 2012 showed hemoglobin 11.4 hematocrit of 33.6 platelets 128 and MCV of 97.  Patient walked into the office this afternoon asking to be seen because of black tarry stools. He relates that he has been taking Indocin on a daily basis over the past couple of months for gout symptoms in his finger. He says he stopped his aspirin a while back because of the Indocin but has continued on his Biaxin. Now over the past 5 days he has been having very dark stools and says today he had a much more obviously black loose bowel movement x2. He denies any dizziness or lightheadedness. He has no complaints of abdominal pain cramping nausea vomiting heartburn indigestion or dysphagia    Review of Systems  Constitutional: Negative.   HENT: Negative.   Eyes: Negative.   Respiratory: Negative.   Cardiovascular: Negative.   Gastrointestinal: Positive for blood in stool.  Genitourinary: Negative.   Musculoskeletal: Positive for arthralgias.  Neurological: Negative.   Hematological: Negative.   Psychiatric/Behavioral: Negative.    Outpatient Prescriptions Prior to Visit  Medication Sig Dispense Refill  . amLODipine-benazepril (LOTREL) 10-40 MG per capsule  Take 1 capsule by mouth daily.  90 capsule  3  . colchicine (COLCRYS) 0.6 MG tablet Take 1 tablet (0.6 mg total) by mouth daily.  30 tablet  1  . dabigatran (PRADAXA) 150 MG CAPS Take 1 capsule (150 mg total) by mouth every 12 (twelve) hours.  60 capsule  6  . indomethacin (INDOCIN SR) 75 MG CR capsule Take 75 mg by mouth daily.      . metoprolol (TOPROL-XL) 100 MG 24 hr tablet Take 1 tablet (100 mg total) by mouth daily.  90 tablet  3  . rosuvastatin (CRESTOR) 20 MG tablet Take 20 mg by mouth as directed. Take 1 tab 2x/week      . aspirin 81 MG tablet Take 81 mg by mouth daily.        . indomethacin (INDOCIN) 50 MG capsule Take 50 mg by mouth 2 (two) times daily as needed.         No Known Allergies     Patient Active Problem List  Diagnoses  . HYPERLIPIDEMIA  . ANEMIA-NOS  . OTHER NONTHROMBOCYTOPENIC PURPURAS  . HYPERTENSION  . MYOCARDIAL INFARCTION, HX OF  . CORONARY ARTERY DISEASE  . ATRIAL FIBRILLATION  . RENAL INSUFFICIENCY  . MUSCLE PAIN  . HYPERGLYCEMIA, FASTING  . HEARING LOSS, LEFT EAR  . RHINITIS  . Unspecified vitamin D deficiency  . Gout  . Carotid artery disease    Objective:   Physical Exam well-developed white male in no acute distress blood pressure 154/62 pulse 88. HEENT; nontraumatic, normocephalic, EOMI, PERRLA sclera anicteric. Neck;Supple, no JVD, Cardiovascular; irregular  rate and rhythm with S1-S2 no murmur gallop, Pulmonary; clear bilaterally, Abdomen; soft nontender, nondistended bowel sounds are active there is no palpable mass or hepatosplenomegaly Rectal grossly melenic black stool liquid in rectal vault heme positive. Extremities trace edema bilaterally, Psych; anxious but otherwise appropriate.        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 We will  hold his pradaxa  and plan for upper endoscopy. For details please see the orders, and admission H&P.

## 2011-04-05 NOTE — Telephone Encounter (Signed)
Discuss with patient, and advise that labs mailed.  BUN, creatinine, and GFR all assess kidney function. To protect the kidneys it is important to control your blood pressure and sugar. You should also stay well hydrated. Drink to thirst, up to 40 ounces of water a day. To prevent gout the minimal uric acid goal is < 7; preferred is < 6, ideally < 5 to prevent gout. The most common cause of elevated uric acid is the ingestion of sugar from high fructose corn syrup sources. You should consume less than 40 grams (preferably ZERO) of sugar per day from foods and drinks with high fructose corn syrup as number 1,2, 3, or #4 on the label.  Stay off HCTZ & monitor BP. Repeat the fasting labs as scheduled after initiating the interventions above. Rheumatology referral if gout does not improve. [employer]

## 2011-04-05 NOTE — H&P (Signed)
Primary Care Physician:  William Hopper, MD, MD Primary Gastroenterologist:  Dr. John Perry  CHIEF COMPLAINT:  Black stools x5 days  HPI: [name] is a 71 y.o. male known to Dr.Perry from colonoscopy done in September of 2012 which showed moderate sigmoid diverticulosis. Patient has history of chronic atrial fibrillation coronary artery disease status post MI also has history of carotid endarterectomy in 2005 and history of hypertension. He is maintained on Prevpac so currently and had also been taking an aspirin daily. He states about 2 months ago he started taking Indocin once daily for gout symptoms in his finger. He says he did not feel that the Indocin helps but he has continued to take it. He also started on colchicine about a week ago. Now over the past 5 days he has noted onset of black stools. He says he's been having one black bowel movement per day, however today he passed to grossly tarry black stools which were more concerning to him and he came to the office as a walk-in.  He denies any lightheadedness or dizziness currently no chest pain or shortness of breath. He is no complaint of heartburn indigestion or dysphagia, no abnormal pain or cramping and has never had any prior GI bleeding.  In October of 2012 he was noted be mildly anemic with hemoglobin of 11.4 hematocrit 33.6 MCV was elevated at 97 and platelets were 128. He does have a history of regular EtOH use. He had an abdominal ultrasound in October of 2012 ALT which did not show any evidence of cirrhosis.   Past Medical History  Diagnosis Date  . Hyperglycemia   . CAD (coronary artery disease)   . Hyperlipidemia   . Myocardial infarction     history of. 1983  . Chronic atrial fibrillation     Dr. Mcalhany  . Anemia   . Renal insufficiency   . Gilbert's syndrome   . Hypertension   . Hemorrhoids   . Cataract     BILATERAL    Past Surgical History  Procedure Date  . Cath/angioplasy     1979,1983, [facility] by  Dr. Greunzig (Founder of angioplasty)  . Carotid endarterectomy     bilaterally 2007  . Tonsillectomy   . No colonoscopy to date     ("I don't have a good excuse"). SOC reviewed  . Cataract extraction     Prior to Admission medications   Medication Sig Start Date End Date Taking? Authorizing Provider  amLODipine-benazepril (LOTREL) 10-40 MG per capsule Take 1 capsule by mouth daily. 08/28/10  Yes William F Hopper, MD  colchicine (COLCRYS) 0.6 MG tablet Take 1 tablet (0.6 mg total) by mouth daily. 03/06/11  Yes William F Hopper, MD  dabigatran (PRADAXA) 150 MG CAPS Take 1 capsule (150 mg total) by mouth every 12 (twelve) hours. 10/30/10  Yes Christopher McAlhany, MD  indomethacin (INDOCIN SR) 75 MG CR capsule Take 75 mg by mouth daily.   Yes Historical Provider, MD  metoprolol (TOPROL-XL) 100 MG 24 hr tablet Take 1 tablet (100 mg total) by mouth daily. 08/28/10 08/28/11 Yes William F Hopper, MD  Multiple Vitamin (MULTIVITAMIN) tablet Take 1 tablet by mouth daily.   Yes Historical Provider, MD  rosuvastatin (CRESTOR) 20 MG tablet Take 20 mg by mouth as directed. Take 1 tab 2x/week 08/28/10  Yes William F Hopper, MD    Current Outpatient Prescriptions  Medication Sig Dispense Refill  . amLODipine-benazepril (LOTREL) 10-40 MG per capsule Take 1 capsule by mouth daily.    90 capsule  3  . colchicine (COLCRYS) 0.6 MG tablet Take 1 tablet (0.6 mg total) by mouth daily.  30 tablet  1  . dabigatran (PRADAXA) 150 MG CAPS Take 1 capsule (150 mg total) by mouth every 12 (twelve) hours.  60 capsule  6  . indomethacin (INDOCIN SR) 75 MG CR capsule Take 75 mg by mouth daily.      . metoprolol (TOPROL-XL) 100 MG 24 hr tablet Take 1 tablet (100 mg total) by mouth daily.  90 tablet  3  . Multiple Vitamin (MULTIVITAMIN) tablet Take 1 tablet by mouth daily.      . rosuvastatin (CRESTOR) 20 MG tablet Take 20 mg by mouth as directed. Take 1 tab 2x/week        Allergies as of 04/05/2011  . (No Known Allergies)     Family History  Problem Relation Age of Onset  . Heart attack Father   . Heart disease Father   . Colon cancer Neg Hx     History   Social History  . Marital Status: Married    Spouse Name: N/A    Number of Children: 3  . Years of Education: N/A   Occupational History  . Stockbroker    Social History Main Topics  . Smoking status: Former Smoker -- 3.0 packs/day    Types: Cigarettes    Quit date: 02/11/1981  . Smokeless tobacco: Never Used  . Alcohol Use: Yes  . Drug Use: No  . Sexually Active: Not on file   Other Topics Concern  . Not on file   Social History Narrative  . No narrative on file    Review of Systems: Gen: Denies any fever, chills, sweats, anorexia, fatigue, weakness, malaise, weight loss, and sleep disorder CV: Denies chest pain, angina, palpitations, syncope, orthopnea, PND, peripheral edema, and claudication. Resp: Denies dyspnea at rest, dyspnea with exercise, cough, sputum, wheezing, coughing up blood, and pleurisy. GI: Denies vomiting blood, jaundice, and fecal incontinence.   Denies dysphagia or odynophagia. GU : Denies urinary burning, blood in urine, urinary frequency, urinary hesitancy, nocturnal urination, and urinary incontinence. MS: Denies joint pain, limitation of movement, and swelling, stiffness, low back pain, extremity pain. Denies muscle weakness, cramps, atrophy.  Derm: Denies rash, itching, dry skin, hives, moles, warts, or unhealing ulcers.  Psych: Denies depression, anxiety, memory loss, suicidal ideation, hallucinations, paranoia, and confusion. Heme: Denies bruising, bleeding, and enlarged lymph nodes. Neuro:  Denies any headaches, dizziness, paresthesias. Endo:  Denies any problems with DM, thyroid, adrenal function.  Physical Exam: Vital signs in last 24 hours: @VSRANGES@ blood pressure 154/62 pulse 88 height 5 foot 9 weight 197   General:   Alert,  Well-developed, well-nourished, pleasant and cooperative in NAD Head:   Normocephalic and atraumatic. Eyes:  Sclera clear, no icterus.   Conjunctiva pink. Ears:  Normal auditory acuity. Nose:  No deformity, discharge,  or lesions. Mouth:  No deformity or lesions.  Oropharynx pink & moist. Neck:  Supple; no masses or thyromegaly. Lungs:  Clear throughout to auscultation.   No wheezes, crackles, or rhonchi. No acute distress. Heart :Irr Regular rate and rhythm; no murmurs, clicks, rubs,  or gallops. Abdomen:  Soft, nontender and nondistended. No masses, hepatosplenomegaly or hernias noted. Normal bowel sounds, without guarding, and without rebound.   Rectal: Grossly black melenic stool in rectal vault Msk:  Symmetrical without gross deformities. Normal posture. Pulses:  Normal pulses noted. Extremities:  Without clubbing , 1+ edema bilaterally r Neurologic:  Alert   and  oriented x4;  grossly normal neurologically. Skin:  Intact without significant lesions or rashes. Cervical Nodes:  No significant cervical adenopathy. Psych:  Alert and cooperative. Normal mood and affect.  Intake/Output from previous day:   Intake/Output this shift: @IOTHISSHIFT@    Studies/Results: No results found.  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 Patient is currently hemodynamically stable. 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 Upper abdominal ultrasound in October 2007 showed no evidence of cirrhosis.  Plan; Clear liquid diet IV Protonix every 12 hours Serial hemoglobins and transfuse as needed for hemoglobin 9 her labs Have scheduled for upper endoscopy with Dr. Hung tomorrow morning Hold Pradaxa Stop Indocin, patient has  been off of his aspirin. Watch for any evidence of the EtOH withdrawal Further plans pending findings of EGD     @RRHLOS@  Sneijder Bernards  04/05/2011, 4:44 PM    admit 

## 2011-04-05 NOTE — Patient Instructions (Signed)
You can go to admitting at [facility].

## 2011-04-05 NOTE — Telephone Encounter (Signed)
Pt came to office, states for the past few days he has had black tarry stools. Today he states he had black diarrhea. Pt states he has been taking Pradaxa and endocin for gout. States Dr. Alwyn Ren placed him on another medication for gout and he is afraid this combination is causing his dark stools. Spoke with Dr. Marina Goodell and he wants pt to be seen by Mike Gip PA. Pt scheduled to see Mike Gip PA today at 3:30pm.

## 2011-04-06 ENCOUNTER — Encounter (HOSPITAL_COMMUNITY): Payer: Self-pay | Admitting: *Deleted

## 2011-04-06 ENCOUNTER — Encounter (HOSPITAL_COMMUNITY)
Admission: AD | Disposition: A | Discharge status: Home / Self Care | Payer: Self-pay | Source: Ambulatory Visit | Attending: Gastroenterology

## 2011-04-06 HISTORY — PX: ESOPHAGOGASTRODUODENOSCOPY: SHX5428

## 2011-04-06 LAB — HEMOGLOBIN AND HEMATOCRIT, BLOOD
HCT: 21.9 % — ABNORMAL LOW (ref 39.0–52.0)
HCT: 22.4 % — ABNORMAL LOW (ref 39.0–52.0)
HCT: 25.1 % — ABNORMAL LOW (ref 39.0–52.0)
Hemoglobin: 7.5 g/dL — ABNORMAL LOW (ref 13.0–17.0)
Hemoglobin: 8.5 g/dL — ABNORMAL LOW (ref 13.0–17.0)

## 2011-04-06 SURGERY — EGD (ESOPHAGOGASTRODUODENOSCOPY)
Anesthesia: Moderate Sedation

## 2011-04-06 MED ORDER — METOPROLOL SUCCINATE ER 100 MG PO TB24
100.0000 mg | ORAL_TABLET | Freq: Every day | ORAL | Status: DC
Start: 2011-04-06 — End: 2011-04-06

## 2011-04-06 MED ORDER — SODIUM CHLORIDE 0.9 % IV SOLN
Freq: Once | INTRAVENOUS | Status: AC
  Administered 2011-04-06: 250 mL via INTRAVENOUS

## 2011-04-06 MED ORDER — AMLODIPINE BESYLATE 10 MG PO TABS
10.0000 mg | ORAL_TABLET | Freq: Every evening | ORAL | Status: DC
  Administered 2011-04-06 – 2011-04-07 (×2): 10 mg via ORAL
  Filled 2011-04-06 (×3): qty 1

## 2011-04-06 MED ORDER — FENTANYL NICU IV SYRINGE 50 MCG/ML
INJECTION | INTRAMUSCULAR | Status: DC | PRN
  Administered 2011-04-06 (×3): 25 ug via INTRAVENOUS

## 2011-04-06 MED ORDER — ADULT MULTIVITAMIN W/MINERALS CH
1.0000 | ORAL_TABLET | Freq: Every day | ORAL | Status: DC
  Administered 2011-04-06 – 2011-04-08 (×3): 1 via ORAL
  Filled 2011-04-06 (×3): qty 1

## 2011-04-06 MED ORDER — COLCHICINE 0.6 MG PO TABS
0.6000 mg | ORAL_TABLET | Freq: Every day | ORAL | Status: DC
Start: 2011-04-06 — End: 2011-04-08
  Administered 2011-04-06 – 2011-04-08 (×3): 0.6 mg via ORAL
  Filled 2011-04-06 (×3): qty 1

## 2011-04-06 MED ORDER — MIDAZOLAM HCL 10 MG/2ML IJ SOLN
INTRAMUSCULAR | Status: DC | PRN
  Administered 2011-04-06 (×4): 2 mg via INTRAVENOUS

## 2011-04-06 MED ORDER — INDOMETHACIN ER 75 MG PO CPCR
75.0000 mg | ORAL_CAPSULE | Freq: Every day | ORAL | Status: DC
  Filled 2011-04-06 (×2): qty 1

## 2011-04-06 MED ORDER — BENAZEPRIL HCL 40 MG PO TABS
40.0000 mg | ORAL_TABLET | Freq: Every evening | ORAL | Status: DC
  Administered 2011-04-06 – 2011-04-07 (×2): 40 mg via ORAL
  Filled 2011-04-06 (×3): qty 1

## 2011-04-06 MED ORDER — ONE-DAILY MULTI VITAMINS PO TABS
1.0000 | ORAL_TABLET | Freq: Every day | ORAL | Status: DC
Start: 2011-04-06 — End: 2011-04-06

## 2011-04-06 MED ORDER — DABIGATRAN ETEXILATE MESYLATE 150 MG PO CAPS
150.0000 mg | ORAL_CAPSULE | Freq: Two times a day (BID) | ORAL | Status: DC
  Administered 2011-04-06 – 2011-04-07 (×3): 150 mg via ORAL
  Filled 2011-04-06 (×4): qty 1

## 2011-04-06 MED ORDER — AMLODIPINE BESY-BENAZEPRIL HCL 10-40 MG PO CAPS
1.0000 | ORAL_CAPSULE | Freq: Every evening | ORAL | Status: DC
Start: 2011-04-06 — End: 2011-04-06

## 2011-04-06 MED ORDER — BUTAMBEN-TETRACAINE-BENZOCAINE 2-2-14 % EX AERO
INHALATION_SPRAY | CUTANEOUS | Status: DC | PRN
  Administered 2011-04-06: 2 via TOPICAL

## 2011-04-06 NOTE — H&P (View-Only) (Signed)
Primary Care Physician:  Marga Melnick, MD, MD Primary Gastroenterologist:  Dr. Yancey Flemings  CHIEF COMPLAINT:  Black stools x5 days  HPI: [name] is a 71 y.o. male known to Dr.Perry from colonoscopy done in September of 2012 which showed moderate sigmoid diverticulosis. Patient has history of chronic atrial fibrillation coronary artery disease status post MI also has history of carotid endarterectomy in 2005 and history of hypertension. He is maintained on Prevpac so currently and had also been taking an aspirin daily. He states about 2 months ago he started taking Indocin once daily for gout symptoms in his finger. He says he did not feel that the Indocin helps but he has continued to take it. He also started on colchicine about a week ago. Now over the past 5 days he has noted onset of black stools. He says he's been having one black bowel movement per day, however today he passed to grossly tarry black stools which were more concerning to him and he came to the office as a walk-in.  He denies any lightheadedness or dizziness currently no chest pain or shortness of breath. He is no complaint of heartburn indigestion or dysphagia, no abnormal pain or cramping and has never had any prior GI bleeding.  In October of 2012 he was noted be mildly anemic with hemoglobin of 11.4 hematocrit 33.6 MCV was elevated at 97 and platelets were 128. He does have a history of regular EtOH use. He had an abdominal ultrasound in October of 2012 ALT which did not show any evidence of cirrhosis.   Past Medical History  Diagnosis Date  . Hyperglycemia   . CAD (coronary artery disease)   . Hyperlipidemia   . Myocardial infarction     history of. 1983  . Chronic atrial fibrillation     Dr. Clifton James  . Anemia   . Renal insufficiency   . Gilbert's syndrome   . Hypertension   . Hemorrhoids   . Cataract     BILATERAL    Past Surgical History  Procedure Date  . Cath/angioplasy     [id number],[id number], [facility] by  Dr. Brett Canales [facility] of angioplasty)  . Carotid endarterectomy     bilaterally 2007  . Tonsillectomy   . No colonoscopy to date     ("I don't have a good excuse"). SOC reviewed  . Cataract extraction     Prior to Admission medications   Medication Sig Start Date End Date Taking? Authorizing Provider  amLODipine-benazepril (LOTREL) 10-40 MG per capsule Take 1 capsule by mouth daily. 08/28/10  Yes Pecola Lawless, MD  colchicine (COLCRYS) 0.6 MG tablet Take 1 tablet (0.6 mg total) by mouth daily. 03/06/11  Yes Pecola Lawless, MD  dabigatran (PRADAXA) 150 MG CAPS Take 1 capsule (150 mg total) by mouth every 12 (twelve) hours. 10/30/10  Yes Verne Carrow, MD  indomethacin (INDOCIN SR) 75 MG CR capsule Take 75 mg by mouth daily.   Yes Historical Provider, MD  metoprolol (TOPROL-XL) 100 MG 24 hr tablet Take 1 tablet (100 mg total) by mouth daily. 08/28/10 08/28/11 Yes Pecola Lawless, MD  Multiple Vitamin (MULTIVITAMIN) tablet Take 1 tablet by mouth daily.   Yes Historical Provider, MD  rosuvastatin (CRESTOR) 20 MG tablet Take 20 mg by mouth as directed. Take 1 tab 2x/week 08/28/10  Yes Pecola Lawless, MD    Current Outpatient Prescriptions  Medication Sig Dispense Refill  . amLODipine-benazepril (LOTREL) 10-40 MG per capsule Take 1 capsule by mouth daily.  90 capsule  3  . colchicine (COLCRYS) 0.6 MG tablet Take 1 tablet (0.6 mg total) by mouth daily.  30 tablet  1  . dabigatran (PRADAXA) 150 MG CAPS Take 1 capsule (150 mg total) by mouth every 12 (twelve) hours.  60 capsule  6  . indomethacin (INDOCIN SR) 75 MG CR capsule Take 75 mg by mouth daily.      . metoprolol (TOPROL-XL) 100 MG 24 hr tablet Take 1 tablet (100 mg total) by mouth daily.  90 tablet  3  . Multiple Vitamin (MULTIVITAMIN) tablet Take 1 tablet by mouth daily.      . rosuvastatin (CRESTOR) 20 MG tablet Take 20 mg by mouth as directed. Take 1 tab 2x/week        Allergies as of 04/05/2011  . (No Known Allergies)     Family History  Problem Relation Age of Onset  . Heart attack Father   . Heart disease Father   . Colon cancer Neg Hx     History   Social History  . Marital Status: Married    Spouse Name: N/A    Number of Children: 3  . Years of Education: N/A   Occupational History  . Stockbroker    Social History Main Topics  . Smoking status: Former Smoker -- 3.0 packs/day    Types: Cigarettes    Quit date: 02/11/1981  . Smokeless tobacco: Never Used  . Alcohol Use: Yes  . Drug Use: No  . Sexually Active: Not on file   Other Topics Concern  . Not on file   Social History Narrative  . No narrative on file    Review of Systems: Gen: Denies any fever, chills, sweats, anorexia, fatigue, weakness, malaise, weight loss, and sleep disorder CV: Denies chest pain, angina, palpitations, syncope, orthopnea, PND, peripheral edema, and claudication. Resp: Denies dyspnea at rest, dyspnea with exercise, cough, sputum, wheezing, coughing up blood, and pleurisy. GI: Denies vomiting blood, jaundice, and fecal incontinence.   Denies dysphagia or odynophagia. GU : Denies urinary burning, blood in urine, urinary frequency, urinary hesitancy, nocturnal urination, and urinary incontinence. MS: Denies joint pain, limitation of movement, and swelling, stiffness, low back pain, extremity pain. Denies muscle weakness, cramps, atrophy.  Derm: Denies rash, itching, dry skin, hives, moles, warts, or unhealing ulcers.  Psych: Denies depression, anxiety, memory loss, suicidal ideation, hallucinations, paranoia, and confusion. Heme: Denies bruising, bleeding, and enlarged lymph nodes. Neuro:  Denies any headaches, dizziness, paresthesias. Endo:  Denies any problems with DM, thyroid, adrenal function.  Physical Exam: Vital signs in last 24 hours: @VSRANGES @ blood pressure 154/62 pulse 88 height 5 foot 9 weight 197   General:   Alert,  Well-developed, well-nourished, pleasant and cooperative in NAD Head:   Normocephalic and atraumatic. Eyes:  Sclera clear, no icterus.   Conjunctiva pink. Ears:  Normal auditory acuity. Nose:  No deformity, discharge,  or lesions. Mouth:  No deformity or lesions.  Oropharynx pink & moist. Neck:  Supple; no masses or thyromegaly. Lungs:  Clear throughout to auscultation.   No wheezes, crackles, or rhonchi. No acute distress. Heart :Irr Regular rate and rhythm; no murmurs, clicks, rubs,  or gallops. Abdomen:  Soft, nontender and nondistended. No masses, hepatosplenomegaly or hernias noted. Normal bowel sounds, without guarding, and without rebound.   Rectal: Grossly black melenic stool in rectal vault Msk:  Symmetrical without gross deformities. Normal posture. Pulses:  Normal pulses noted. Extremities:  Without clubbing , 1+ edema bilaterally r Neurologic:  Alert  and  oriented x4;  grossly normal neurologically. Skin:  Intact without significant lesions or rashes. Cervical Nodes:  No significant cervical adenopathy. Psych:  Alert and cooperative. Normal mood and affect.  Intake/Output from previous day:   Intake/Output this shift: @IOTHISSHIFT @    Studies/Results: No results found.  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 Patient is currently hemodynamically stable. 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 Upper abdominal ultrasound in October 2007 showed no evidence of cirrhosis.  Plan; Clear liquid diet IV Protonix every 12 hours Serial hemoglobins and transfuse as needed for hemoglobin 9 her labs Have scheduled for upper endoscopy with Dr. Elnoria Howard tomorrow morning Hold Pradaxa Stop Indocin, patient has  been off of his aspirin. Watch for any evidence of the EtOH withdrawal Further plans pending findings of EGD     @RRHLOS @  Frans Valente  04/05/2011, 4:44 PM    admit

## 2011-04-06 NOTE — Interval H&P Note (Signed)
History and Physical Interval Note:  04/06/2011 12:02 PM  [name]  has presented today for surgery, with the diagnosis of gi bleed  The various methods of treatment have been discussed with the patient and family. After consideration of risks, benefits and other options for treatment, the patient has consented to  Procedure(s) (LRB): ESOPHAGOGASTRODUODENOSCOPY (EGD) (N/A) as a surgical intervention .  The patients' history has been reviewed, patient examined, no change in status, stable for surgery.  I have reviewed the patients' chart and labs.  Questions were answered to the patient's satisfaction.     Nayra Coury D

## 2011-04-06 NOTE — Op Note (Signed)
[facility] [address] [place], [place]  [address]  OPERATIVE PROCEDURE REPORT  PATIENT:  Angelino, Rumery  MR#:  [id number] BIRTHDATE:  1940-05-05  GENDER:  male ENDOSCOPIST:  Jeani Hawking, MD ASSISTANT:  Judithann Sauger, RN and Kandice Robinsons PROCEDURE DATE:  04/06/2011 PROCEDURE:  EGD, diagnostic [phone] ASA CLASS:  Class III INDICATIONS:  Melena and anemia MEDICATIONS:  Fentanyl 75 mcg IV, Versed 8 mg IV  DESCRIPTION OF PROCEDURE:   After the risks benefits and alternatives of the procedure were thoroughly explained, informed consent was obtained.  The Pentax Gastroscope [id number] endoscope was introduced through the mouth and advanced to the second portion of the duodenum, without limitations.  The instrument was slowly withdrawn as the mucosa was fully examined. <<PROCEDUREIMAGES>>  FINDINGS:  The esophagus was normal. Upon initial entry into the gastric lumen there was a large fundic clot. It was able to be cleared. Closer inspection of the gastric mucosal revealed multiple erosions in the gastric body. In the duodenal bulb there was evidence of multiple clean-based shallow ulcers. Retroflexed views revealed a hiatal hernia.    The scope was then withdrawn from the patient and the procedure terminated.  COMPLICATIONS:  None  IMPRESSION:  1) Mild gastritis 2) A hiatal hernia RECOMMENDATIONS:  1) Avoid NSAIDs. 2) Avoid anticoagulants. 3) Follow HGB and transfuse as needed.  ______________________________ Jeani Hawking, MD  n. Rosalie DoctorJeani Hawking at 04/06/2011 12:38 PM  Geoffery Spruce, [phone]

## 2011-04-06 NOTE — Progress Notes (Addendum)
Md notified at 0215 of HGB 7.5 and HCT 21.9. MD on call states to hold blood transfusion for now. Will continue to monitor

## 2011-04-07 LAB — HEMOGLOBIN AND HEMATOCRIT, BLOOD
HCT: 23.6 % — ABNORMAL LOW (ref 39.0–52.0)
HCT: 29.4 % — ABNORMAL LOW (ref 39.0–52.0)

## 2011-04-07 LAB — PREPARE RBC (CROSSMATCH)

## 2011-04-07 NOTE — Progress Notes (Signed)
Patient ID: [name], male   DOB: 1940-11-11, 71 y.o.   MRN: [id number]  Covering for [facility]  Subjective: Anxious to go home.  No further melena.  Feels well.  Objective: Vital signs in last 24 hours: Temp:  [97.8 F (36.6 C)-99.2 F (37.3 C)] 98.5 F (36.9 C) (02/24 0600) Pulse Rate:  [65-103] 65  (02/24 0600) Resp:  [14-21] 18  (02/24 0600) BP: (108-161)/(46-93) 119/55 mmHg (02/24 0600) SpO2:  [97 %-100 %] 100 % (02/24 0600) Last BM Date: 04/06/11  Intake/Output from previous day: 02/23 0701 - 02/24 0700 In: 288.8 [Blood:288.8] Out: 350 [Urine:350] Intake/Output this shift:    General appearance: alert and no distress Resp: clear to auscultation bilaterally Cardio: irregularly irregular rhythm GI: soft, non-tender; bowel sounds normal; no masses,  no organomegaly Extremities: extremities normal, atraumatic, no cyanosis or edema  Lab Results:  Basename 04/07/11 0607 04/06/11 2230 04/06/11 0926 04/05/11 1822  WBC -- -- -- 8.4  HGB 8.1* 8.5* 7.5* --  HCT 23.6* 25.1* 22.4* --  PLT -- -- -- 140*   BMET  Basename 04/05/11 1822  NA 132*  K 4.0  CL 98  CO2 23  GLUCOSE 138*  BUN 50*  CREATININE 1.66*  CALCIUM 9.0   LFT  Basename 04/05/11 1822  PROT 6.5  ALBUMIN 3.1*  AST 18  ALT 12  ALKPHOS 78  BILITOT 0.6  BILIDIR --  IBILI --   PT/INR  Basename 04/05/11 1822  LABPROT 19.4*  INR 1.61*   Hepatitis Panel No results found for this basename: HEPBSAG,HCVAB,HEPAIGM,HEPBIGM in the last 72 hours C-Diff No results found for this basename: CDIFFTOX:3 in the last 72 hours Fecal Lactopherrin No results found for this basename: FECLLACTOFRN in the last 72 hours  Studies/Results: No results found.  Medications:  Scheduled:   . sodium chloride   Intravenous Once  . acetaminophen  650 mg Oral Once  . amLODipine  10 mg Oral QPM   And  . benazepril  40 mg Oral QPM  . colchicine  0.6 mg Oral Daily  . dabigatran  150 mg Oral Q12H  .  indomethacin  75 mg Oral Daily  . metoprolol succinate  100 mg Oral Daily  . mulitivitamin with minerals  1 tablet Oral Daily  . pantoprazole (PROTONIX) IV  40 mg Intravenous Q12H  . DISCONTD: amLODipine-benazepril  1 capsule Oral QPM  . DISCONTD: metoprolol succinate  100 mg Oral Daily  . DISCONTD: multivitamin  1 tablet Oral Daily   Continuous:   . dextrose 5 % and 0.45 % NaCl with KCl 10 mEq/L 100 mL/hr at 04/06/11 2122    Assessment/Plan: 1) NSAID-induced gastric and duodenal ulcers worsened with Pradaxa.   The patient is s/p 2 units of PRBC, but he is still anemic.  I will transfuse him two more units with his history of cardiac issues.  Plan: 1) 2 additional units of PRBC. 2) Probable D/C in AM.  LOS: 2 days   [name] D 04/07/2011, 8:04 AM

## 2011-04-08 ENCOUNTER — Other Ambulatory Visit: Payer: Self-pay | Admitting: *Deleted

## 2011-04-08 ENCOUNTER — Telehealth: Payer: Self-pay | Admitting: Internal Medicine

## 2011-04-08 ENCOUNTER — Encounter (HOSPITAL_COMMUNITY): Payer: Self-pay | Admitting: Gastroenterology

## 2011-04-08 DIAGNOSIS — K2961 Other gastritis with bleeding: Secondary | ICD-10-CM | POA: Diagnosis present

## 2011-04-08 DIAGNOSIS — D62 Acute posthemorrhagic anemia: Secondary | ICD-10-CM

## 2011-04-08 DIAGNOSIS — D649 Anemia, unspecified: Secondary | ICD-10-CM

## 2011-04-08 DIAGNOSIS — I4891 Unspecified atrial fibrillation: Secondary | ICD-10-CM

## 2011-04-08 DIAGNOSIS — K260 Acute duodenal ulcer with hemorrhage: Secondary | ICD-10-CM | POA: Diagnosis present

## 2011-04-08 LAB — TYPE AND SCREEN
Unit division: 0
Unit division: 0

## 2011-04-08 MED ORDER — PANTOPRAZOLE SODIUM 40 MG PO TBEC: 40.0000 mg | DELAYED_RELEASE_TABLET | Freq: Two times a day (BID) | ORAL | Status: DC

## 2011-04-08 MED ORDER — ESOMEPRAZOLE MAGNESIUM 40 MG PO CPDR: 40.0000 mg | DELAYED_RELEASE_CAPSULE | Freq: Two times a day (BID) | ORAL | Status: DC

## 2011-04-08 NOTE — Discharge Instructions (Signed)
Come to [facility] lab on Wednesday 2/27 for labs- you do not need to be fasting  Stop Indocin   call for any problems with weakness ,recurrent black stools etc. Stay off Pradaxa  Until you see your cardiologist

## 2011-04-08 NOTE — Progress Notes (Signed)
Hemoglobin 8.8. MD notified

## 2011-04-08 NOTE — Telephone Encounter (Signed)
Pts wife aware and samples left up front for pick-up.

## 2011-04-08 NOTE — Telephone Encounter (Signed)
Pt was discharged from the hospital on Protonix. Per the pts wife with their insurance it will be 48 hours until they can get the protonix. Wife wanted to know if we had protonix samples to get through until they can get the prescription. There are no protonix samples. Can we provide them with samples of another drug until they get the script? Dr. Marina Goodell please advise.

## 2011-04-08 NOTE — Discharge Summary (Addendum)
[facility] Discharge Summary  Name: [name] MRN: [id number] DOB: [birth year] 71 y.o. PCP:  Marga Melnick, MD, MD  Date of Admission: 04/05/2011  5:10 PM Date of Discharge: 04/08/2011 Attending Physician: Louis Meckel, MD  Discharge Diagnosis: #88  71 year old male with acute upper GI bleed secondary to multiple shallow duodenal clean-based ulcers NSAID-induced. #2 chronic anti-coagulation with Pradaxa complicating above #3 anemia secondary to acute blood loss stable #4 chronic atrial fibrillation #5 coronary artery disease status post remote MI #6 history of gout   Consultations: None Procedures Performed: Upper endoscopy per Dr. Elnoria Howard 2/ 23 /2013 GI Procedures  History/Physical Exam:  See Admission H&P  Admission HPI: [name]  is a 71 year old white male known to Dr. Yancey Flemings. Who presented to the office as a walk-in on Friday, 04/05/2011 complaining of 4 day history of black stool with 2 more grossly melenic stools on the day of admission. He denied any complaints of abdominal pain, cramping nausea, indigestion but did complain of some weakness and mild lightheadedness. He said he had been having 1 black stool each day since Monday and then on Friday had 2 very very black tarry stools. He is on pradaxa  chronically for atrial fibrillation and also had been taking Indocin 50 mg daily over the past couple of months for gout.  He was seen and evaluated in the office; felt to be having an acute GI bleed and admitted for supportive management.  Hospital Course by problem list: [name] was admitted to the GI service on 04/05/2011 with an acute upper GI bleed. He underwent upper endoscopy with Dr. Elnoria Howard the following morning and was found to have multiple shallow clean-based duodenal ulcers felt to be NSAID-induced. He did not require any endoscopic therapy. His hemoglobin did drift to the 7 range and he was transfused 2 units of packed RBCs. He did have some black  stools on Sunday, 04/07/2011 at said they were less black than on admission. This point on 04/08/2011 he is felt to be stable with a hemoglobin of 8.8 and is allowed discharge to home. He will be discharged on Protonix by mouth twice daily for at least one month and then may remain on Protonix long term thereafter. He is advised to stop Indocin forever and to avoid any NSAIDs. We will hold his Pradaxa for 2 weeks. He has follow up with his cardiologist next week. He will followup in our office in 48 hours for repeat CBC and is advised to call for any problems with recurrent melena. We will also see him in followup in the office in 2 weeks.  Discharge Vitals:  BP 105/56  Pulse 69  Temp(Src) 98.6 F (37 C) (Oral)  Resp 20  Ht 5\' 9"  (1.753 m)  Wt 191 lb 12.8 oz (87 kg)  BMI 28.32 kg/m2  SpO2 100%  Discharge Labs:  Results for orders placed during the hospital encounter of 04/05/11 (from the past 24 hour(s))  HEMOGLOBIN AND HEMATOCRIT, BLOOD     Status: Abnormal   Collection Time   04/07/11  8:07 PM      Component Value Range   Hemoglobin 10.0 (*) 13.0 - 17.0 (g/dL)   HCT 91.4 (*) 78.2 - 52.0 (%)  HEMOGLOBIN AND HEMATOCRIT, BLOOD     Status: Abnormal   Collection Time   04/08/11  3:08 AM      Component Value Range   Hemoglobin 8.8 (*) 13.0 - 17.0 (g/dL)   HCT 95.6 (*) 21.3 - 52.0 (%)  Disposition and follow-up:   [name] was discharged from [facility] in good  condition.    Follow-up Appointments: Discharge Orders    Future Appointments: Provider: Department: Dept Phone: Center:   04/16/2011 9:00 AM Verne Carrow, MD [facility] [phone] [facility]   04/16/2011 10:30 AM [facility] [phone] [facility]   04/29/2011 3:45 PM Yancey Flemings, MD [facility] [phone] [facility]      Discharge Medications: Medication List  As of 04/08/2011  9:34 AM   STOP taking these medications         dabigatran 150 MG Caps        indomethacin 75 MG CR capsule         TAKE these medications         amLODipine-benazepril 10-40 MG per capsule   Commonly known as: LOTREL   Take 1 capsule by mouth every evening.      colchicine 0.6 MG tablet   Take 1 tablet (0.6 mg total) by mouth daily.      metoprolol succinate 100 MG 24 hr tablet   Commonly known as: TOPROL-XL   Take 1 tablet (100 mg total) by mouth daily.      multivitamin tablet   Take 1 tablet by mouth daily.      pantoprazole 40 MG tablet   Commonly known as: PROTONIX   Take 1 tablet (40 mg total) by mouth 2 (two) times daily before a meal.      rosuvastatin 20 MG tablet   Commonly known as: CRESTOR   Take 20 mg by mouth as directed. Take 1 tab 2x/week on Mondays and Fridays            Signed: Mike Gip 04/08/2011, 9:34 AM   I have discussed the care with Ms. Monica Becton and agree with discharge plans as outlined.  Iva Boop, MD, [facility]

## 2011-04-08 NOTE — Progress Notes (Signed)
Patient ID: [name], male   DOB: 04-Mar-1940, 71 y.o.   MRN: [id number] [facility] Progress Note  Subjective: Begging to go home, No c/o nausea,or abdominal pain. Last bm's  Yesterday- not as black. Transfused yesterday.  Objective:  Vital signs in last 24 hours: Temp:  [97.3 F (36.3 C)-98.6 F (37 C)] 98.6 F (37 C) (02/25 0600) Pulse Rate:  [69-99] 69  (02/25 0600) Resp:  [18-20] 20  (02/25 0600) BP: (105-147)/(56-78) 105/56 mmHg (02/25 0600) SpO2:  [100 %] 100 % (02/25 0600) Last BM Date: 04/07/11 General:   Alert,  Well-developed,    in NAD Heart:  Regular rate and rhythm; no murmurs Pulm;clear Abdomen:  Soft, nontender and nondistended. Normal bowel sounds, without guarding   Extremities:  Without edema. Neurologic:  Alert and  oriented x4;  grossly normal neurologically. Psych:  Alert and cooperative. Normal mood and affect.  Intake/Output from previous day: 02/24 0701 - 02/25 0700 In: 720 [P.O.:720] Out: 250 [Urine:250] Intake/Output this shift:    Lab Results:  Basename 04/08/11 0308 04/07/11 2007 04/07/11 0607 04/05/11 1822  WBC -- -- -- 8.4  HGB 8.8* 10.0* 8.1* --  HCT 26.0* 29.4* 23.6* --  PLT -- -- -- 140*   BMET  Basename 04/05/11 1822  NA 132*  K 4.0  CL 98  CO2 23  GLUCOSE 138*  BUN 50*  CREATININE 1.66*  CALCIUM 9.0   LFT  Basename 04/05/11 1822  PROT 6.5  ALBUMIN 3.1*  AST 18  ALT 12  ALKPHOS 78  BILITOT 0.6  BILIDIR --  IBILI --   PT/INR  Basename 04/05/11 1822  LABPROT 19.4*  INR 1.61*    IMP/PLAN:    71 yo male with  Gi bleed secondary to multiple shallow clean based duodenal ulcers, Nsaid induced.  He is stable Will discharge home today, f/u cbc in 48 hours office visit in 2 weeks Off Pradaxa x 2weeks-he has f/u with cardiologist  Next week BID Protonix , d/c indocin    Active Problems:  * No active hospital problems. *      LOS: 3 days   [name]  04/08/2011, 8:51 AM

## 2011-04-08 NOTE — Progress Notes (Signed)
PATIENT SEEN AND EXAMINED WITH PA. AGREE WITH ASSESSMENT AND PLANS FOR ADMISSION FOR UPPER GI BLEED

## 2011-04-08 NOTE — Progress Notes (Signed)
Agree with Ms. Esterwood's assessment and plan. Wynee Matarazzo E. Sneha Willig, MD, FACG   

## 2011-04-08 NOTE — Telephone Encounter (Signed)
Yes. Give them Nexium samples. Take 40 mg bid

## 2011-04-15 ENCOUNTER — Telehealth: Payer: Self-pay | Admitting: Physician Assistant

## 2011-04-16 ENCOUNTER — Ambulatory Visit (INDEPENDENT_AMBULATORY_CARE_PROVIDER_SITE_OTHER): Payer: Managed Care, Other (non HMO) | Admitting: *Deleted

## 2011-04-16 ENCOUNTER — Telehealth: Payer: Self-pay

## 2011-04-16 ENCOUNTER — Ambulatory Visit (INDEPENDENT_AMBULATORY_CARE_PROVIDER_SITE_OTHER): Payer: Managed Care, Other (non HMO) | Admitting: Cardiovascular Disease

## 2011-04-16 ENCOUNTER — Encounter: Payer: Self-pay | Admitting: Cardiovascular Disease

## 2011-04-16 VITALS — BP 141/78 | HR 88 | Ht 69.0 in | Wt 197.0 lb

## 2011-04-16 DIAGNOSIS — I779 Disorder of arteries and arterioles, unspecified: Secondary | ICD-10-CM

## 2011-04-16 DIAGNOSIS — I2510 Atherosclerotic heart disease of native coronary artery without angina pectoris: Secondary | ICD-10-CM | POA: Diagnosis not present

## 2011-04-16 DIAGNOSIS — I4891 Unspecified atrial fibrillation: Secondary | ICD-10-CM | POA: Diagnosis not present

## 2011-04-16 DIAGNOSIS — I252 Old myocardial infarction: Secondary | ICD-10-CM

## 2011-04-16 DIAGNOSIS — E785 Hyperlipidemia, unspecified: Secondary | ICD-10-CM

## 2011-04-16 DIAGNOSIS — I10 Essential (primary) hypertension: Secondary | ICD-10-CM

## 2011-04-16 DIAGNOSIS — D649 Anemia, unspecified: Secondary | ICD-10-CM

## 2011-04-16 LAB — HEPATIC FUNCTION PANEL
ALT: 13 U/L (ref 0–53)
Alkaline Phosphatase: 74 U/L (ref 39–117)
Bilirubin, Direct: 0.2 mg/dL (ref 0.0–0.3)
Total Bilirubin: 0.9 mg/dL (ref 0.3–1.2)
Total Protein: 6.5 g/dL (ref 6.0–8.3)

## 2011-04-16 LAB — BASIC METABOLIC PANEL
BUN: 10 mg/dL (ref 6–23)
Calcium: 8.7 mg/dL (ref 8.4–10.5)
Creatinine, Ser: 1.2 mg/dL (ref 0.4–1.5)
GFR: 62.8 mL/min (ref >60.00)
Glucose, Bld: 96 mg/dL (ref 70–99)

## 2011-04-16 LAB — CBC WITH DIFFERENTIAL/PLATELET
Basophils Relative: 0.8 % (ref 0.0–3.0)
Eosinophils Absolute: 0.1 10*3/uL (ref 0.0–0.7)
Eosinophils Relative: 1.3 % (ref 0.0–5.0)
HCT: 31.5 % — ABNORMAL LOW (ref 39.0–52.0)
Hemoglobin: 10.4 g/dL — ABNORMAL LOW (ref 13.0–17.0)
Lymphs Abs: 0.6 10*3/uL — ABNORMAL LOW (ref 0.7–4.0)
MCHC: 33 g/dL (ref 30.0–36.0)
MCV: 97.8 fl (ref 78.0–100.0)
Monocytes Absolute: 0.4 10*3/uL (ref 0.1–1.0)
Neutro Abs: 3.8 10*3/uL (ref 1.4–7.7)
Neutrophils Relative %: 77.5 % — ABNORMAL HIGH (ref 43.0–77.0)
RBC: 3.22 Mil/uL — ABNORMAL LOW (ref 4.22–5.81)

## 2011-04-16 LAB — TSH: TSH: 0.51 u[IU]/mL (ref 0.35–5.50)

## 2011-04-16 LAB — LIPID PANEL: Cholesterol: 134 mg/dL (ref 0–200)

## 2011-04-16 MED ORDER — ESOMEPRAZOLE MAGNESIUM 40 MG PO CPDR: 40.0000 mg | DELAYED_RELEASE_CAPSULE | Freq: Two times a day (BID) | ORAL | Status: DC

## 2011-04-16 MED ORDER — ESOMEPRAZOLE MAGNESIUM 40 MG PO CPDR: 40.0000 mg | DELAYED_RELEASE_CAPSULE | Freq: Every day | ORAL | Status: DC

## 2011-04-16 NOTE — Assessment & Plan Note (Signed)
Nexium BID for 3 more weeks then once daily per GI.

## 2011-04-16 NOTE — Telephone Encounter (Signed)
Pt was seen by [facility] today. Dr. Clifton James wrote the pt a script for Nexium. Cardiology called and states the pt needs a prior auth for the Nexium but they were asking GI questions that cardiology did not know. Called [phone] to try and obtain prior auth. [facility] states they will fax back their decision in 1-2 business days.

## 2011-04-16 NOTE — Assessment & Plan Note (Signed)
Will continue Toprol for rate control. He will restart Pradaxa in 2 weeks.

## 2011-04-16 NOTE — Assessment & Plan Note (Signed)
He will f/u with Dr. Hart Rochester for this.

## 2011-04-16 NOTE — Patient Instructions (Signed)
Your physician wants you to follow-up in:6 months.  You will receive a reminder letter in the mail two months in advance. If you don't receive a letter, please call our office to schedule the follow-up appointment.  Your physician has recommended you make the following change in your medication: Continue Nexium 40 mg by mouth twice daily for 3 weeks and then change to once daily.

## 2011-04-16 NOTE — Assessment & Plan Note (Signed)
Stable No changes 

## 2011-04-16 NOTE — Progress Notes (Signed)
History of Present Illness:[name] is a pleasant 71 year old Caucasian male with a past medical history significant for atrial  fibrillation, hypertension, hyperlipidemia, coronary artery disease status post MI, and balloon angioplasty in 1980s, as well as peripheral vascular disease with bilateral carotid endarterectomies in 2005, who is here today for cardiac followup. He was last seen in our office in September of 2012. He had been on coumadin in the past but he stopped taking because of a rash and did not wish to restart. He has refused cardioversion or ablation in the past. He has never been aware of his irregular heart rhythm. He has also refused to escalate his medical therapy with antiarrythmic drugs. I started Pradaxa at the last visit in September 2012. I offered him carotid dopplers at the last appt but he refused and wished to follow up with Dr. Hart Rochester. He was admitted to the GI service on 04/05/2011 with an acute upper GI bleed. He underwent upper endoscopy with Dr. Elnoria Howard the following morning and was found to have multiple shallow clean-based duodenal ulcers felt to be NSAID-induced. He did not require any endoscopic therapy. His hemoglobin did drift to the 7 range and he was transfused 2 units of packed RBCs. Pradaxa was held. He was started on PPI BID. He did well and was discharged on 04/08/11 with plans for PPI BID for three weeks then daily. GI suggested holding Pradaxa for at least 2 full weeks. He was given Nexium samples by GI and told to check on cost of Protonix. He found out it was 180 dollars and wants to stay on Nexium.  He is here today for follow up. No dizziness, chest pain or SOB. He is feeling much better. No black stools. He tells me that he had been using Indomethacin for his gout and was drinking alcohol and feels that this is why he developed ulcers.   Primary Care Physician: Marga Melnick  Last Lipid Profile: Pending today  Past Medical History  Diagnosis Date    . Hyperglycemia   . CAD (coronary artery disease)   . Hyperlipidemia   . Chronic atrial fibrillation     Dr. Clifton James  . Anemia   . Gilbert's syndrome   . Hypertension   . Hemorrhoids   . Cataract     BILATERAL  . Renal insufficiency   . Myocardial infarction     history of. 1983    Past Surgical History  Procedure Date  . Cath/angioplasy     [id number], [place] by Dr. Brett Canales [facility] of angioplasty)  . Carotid endarterectomy     bilaterally 2007  . Tonsillectomy   . No colonoscopy to date     ("I don't have a good excuse"). SOC reviewed  . Cataract extraction   . Pyloric stenosis   . Esophagogastroduodenoscopy 04/06/2011    Procedure: ESOPHAGOGASTRODUODENOSCOPY (EGD);  Surgeon: Theda Belfast, MD;  Location: [facility];  Service: Endoscopy;  Laterality: N/A;    Current Outpatient Prescriptions  Medication Sig Dispense Refill  . amLODipine-benazepril (LOTREL) 10-40 MG per capsule Take 1 capsule by mouth every evening.      . colchicine (COLCRYS) 0.6 MG tablet Take 1 tablet (0.6 mg total) by mouth daily.  30 tablet  1  . esomeprazole (NEXIUM) 40 MG capsule Take 1 capsule (40 mg total) by mouth 2 (two) times daily.  20 capsule  0  . metoprolol (TOPROL-XL) 100 MG 24 hr tablet Take 1 tablet (100 mg total) by mouth daily.  90 tablet  3  . Multiple Vitamin (MULTIVITAMIN) tablet Take 1 tablet by mouth daily.      . rosuvastatin (CRESTOR) 20 MG tablet Take 20 mg by mouth as directed. Take 1 tab 2x/week on Mondays and Fridays        No Known Allergies  History   Social History  . Marital Status: Married    Spouse Name: N/A    Number of Children: 3  . Years of Education: N/A   Occupational History  . Stockbroker    Social History Main Topics  . Smoking status: Former Smoker -- 3.0 packs/day    Types: Cigarettes    Quit date: 02/11/1981  . Smokeless tobacco: Never Used  . Alcohol Use: 9.6 oz/week    16 Shots of liquor per week  . Drug Use: No  . Sexually Active:  Not on file   Other Topics Concern  . Not on file   Social History Narrative  . No narrative on file    Family History  Problem Relation Age of Onset  . Heart attack Father   . Heart disease Father   . Colon cancer Neg Hx     Review of Systems:  As stated in the HPI and otherwise negative.   BP 141/78  Pulse 88  Ht 5\' 9"  (1.753 m)  Wt 197 lb (89.359 kg)  BMI 29.09 kg/m2  Physical Examination: General: Well developed, well nourished, NAD HEENT: OP clear, mucus membranes moist SKIN: warm, dry. No rashes. Neuro: No focal deficits Musculoskeletal: Muscle strength 5/5 all ext Psychiatric: Mood and affect normal Neck: No JVD, no carotid bruits, no thyromegaly, no lymphadenopathy. Lungs:Clear bilaterally, no wheezes, rhonci, crackles Cardiovascular: Regular rate and rhythm. No murmurs, gallops or rubs. Abdomen:Soft. Bowel sounds present. Non-tender.  Extremities: No lower extremity edema. Pulses are 2 + in the bilateral DP/PT.

## 2011-04-17 NOTE — Telephone Encounter (Signed)
Another Phone note was created.

## 2011-04-18 ENCOUNTER — Telehealth: Payer: Self-pay | Admitting: Cardiovascular Disease

## 2011-04-18 NOTE — Telephone Encounter (Signed)
Fu call °Patient is returning your call °

## 2011-04-18 NOTE — Telephone Encounter (Signed)
Pt aware of lab results. Debbie Terius Jacuinde RN  

## 2011-04-19 ENCOUNTER — Telehealth: Payer: Self-pay | Admitting: *Deleted

## 2011-04-19 NOTE — Telephone Encounter (Signed)
I spoke to the patient tonight. He just restarted colchicine today.  If colchicine does not work over next two days, he should call primary care. cdm

## 2011-04-19 NOTE — Telephone Encounter (Signed)
Pt is aware of lab results. He is also having a problem with his gout.  He is colchine He would like to know if Dr. Clifton James has any recommendations for what else he could take. Also needs prior authorization on Niacin Mylo Red RN

## 2011-04-19 NOTE — Telephone Encounter (Signed)
Message copied by Barrie Folk on Fri Apr 19, 2011  3:51 PM ------      Message from: Verne Carrow D      Created: Thu Apr 18, 2011 10:49 AM       Can we let him know results? Renal function stable, TSH ok, LFTs ok, lipids at goal, Hgb stable. Thanks, chris

## 2011-04-22 ENCOUNTER — Telehealth: Payer: Self-pay | Admitting: Internal Medicine

## 2011-04-22 ENCOUNTER — Encounter: Payer: Self-pay | Admitting: Family Medicine

## 2011-04-22 ENCOUNTER — Ambulatory Visit (INDEPENDENT_AMBULATORY_CARE_PROVIDER_SITE_OTHER): Payer: Managed Care, Other (non HMO) | Admitting: Family Medicine

## 2011-04-22 DIAGNOSIS — M109 Gout, unspecified: Secondary | ICD-10-CM

## 2011-04-22 MED ORDER — COLCHICINE 0.6 MG PO TABS: ORAL_TABLET | ORAL | Status: DC

## 2011-04-22 MED ORDER — ALLOPURINOL 100 MG PO TABS: ORAL_TABLET | ORAL | Status: DC

## 2011-04-22 NOTE — Progress Notes (Signed)
  Subjective:    Patient ID: [name], male    DOB: 1940/04/12, 71 y.o.   MRN: [id number]  HPI Pt here c/o gout in R ankle.  + pain and inflammation x few days.  Pt is on daily colchicine.  He has had red meat about 4 days in a row.  His wife is with him.   Review of Systems As above    Objective:   Physical Exam  Constitutional: He is oriented to person, place, and time. He appears well-developed and well-nourished.  Musculoskeletal: He exhibits edema and tenderness.       r ankle--- warm to touch,  + errythema and swelling medially  Neurological: He is alert and oriented to person, place, and time.  Psychiatric: He has a normal mood and affect. His behavior is normal. Judgment and thought content normal.          Assessment & Plan:

## 2011-04-22 NOTE — Assessment & Plan Note (Signed)
With hx hyperuricemia---- bmp normal last week with cardio Pt can take 2 colchicine when he gets home---he took 1 this am.  ---start allopurinol and recheck uric acid in about 2 weeks

## 2011-04-22 NOTE — Patient Instructions (Signed)
Gout Gout is an inflammatory condition (arthritis) caused by a buildup of uric acid crystals in the joints. Uric acid is a chemical that is normally present in the blood. Under some circumstances, uric acid can form into crystals in your joints. This causes joint redness, soreness, and swelling (inflammation). Repeat attacks are common. Over time, uric acid crystals can form into masses (tophi) near a joint, causing disfigurement. Gout is treatable and often preventable. CAUSES  The disease begins with elevated levels of uric acid in the blood. Uric acid is produced by your body when it breaks down a naturally found substance called purines. This also happens when you eat certain foods such as meats and fish. Causes of an elevated uric acid level include:  Being passed down from parent to child (heredity).   Diseases that cause increased uric acid production (obesity, psoriasis, some cancers).   Excessive alcohol use.   Diet, especially diets rich in meat and seafood.   Medicines, including certain cancer-fighting drugs (chemotherapy), diuretics, and aspirin.   Chronic kidney disease. The kidneys are no longer able to remove uric acid well.   Problems with metabolism.  Conditions strongly associated with gout include:  Obesity.   High blood pressure.   High cholesterol.   Diabetes.  Not everyone with elevated uric acid levels gets gout. It is not understood why some people get gout and others do not. Surgery, joint injury, and eating too much of certain foods are some of the factors that can lead to gout. SYMPTOMS   An attack of gout comes on quickly. It causes intense pain with redness, swelling, and warmth in a joint.   Fever can occur.   Often, only one joint is involved. Certain joints are more commonly involved:   Base of the big toe.   Knee.   Ankle.   Wrist.   Finger.  Without treatment, an attack usually goes away in a few days to weeks. Between attacks, you  usually will not have symptoms, which is different from many other forms of arthritis. DIAGNOSIS  Your caregiver will suspect gout based on your symptoms and exam. Removal of fluid from the joint (arthrocentesis) is done to check for uric acid crystals. Your caregiver will give you a medicine that numbs the area (local anesthetic) and use a needle to remove joint fluid for exam. Gout is confirmed when uric acid crystals are seen in joint fluid, using a special microscope. Sometimes, blood, urine, and X-ray tests are also used. TREATMENT  There are 2 phases to gout treatment: treating the sudden onset (acute) attack and preventing attacks (prophylaxis). Treatment of an Acute Attack  Medicines are used. These include anti-inflammatory medicines or steroid medicines.   An injection of steroid medicine into the affected joint is sometimes necessary.   The painful joint is rested. Movement can worsen the arthritis.   You may use warm or cold treatments on painful joints, depending which works best for you.   Discuss the use of coffee, vitamin C, or cherries with your caregiver. These may be helpful treatment options.  Treatment to Prevent Attacks After the acute attack subsides, your caregiver may advise prophylactic medicine. These medicines either help your kidneys eliminate uric acid from your body or decrease your uric acid production. You may need to stay on these medicines for a very long time. The early phase of treatment with prophylactic medicine can be associated with an increase in acute gout attacks. For this reason, during the first few months   of treatment, your caregiver may also advise you to take medicines usually used for acute gout treatment. Be sure you understand your caregiver's directions. You should also discuss dietary treatment with your caregiver. Certain foods such as meats and fish can increase uric acid levels. Other foods such as dairy can decrease levels. Your caregiver  can give you a list of foods to avoid. HOME CARE INSTRUCTIONS   Do not take aspirin to relieve pain. This raises uric acid levels.   Only take over-the-counter or prescription medicines for pain, discomfort, or fever as directed by your caregiver.   Rest the joint as much as possible. When in bed, keep sheets and blankets off painful areas.   Keep the affected joint raised (elevated).   Use crutches if the painful joint is in your leg.   Drink enough water and fluids to keep your urine clear or pale yellow. This helps your body get rid of uric acid. Do not drink alcoholic beverages. They slow the passage of uric acid.   Follow your caregiver's dietary instructions. Pay careful attention to the amount of protein you eat. Your daily diet should emphasize fruits, vegetables, whole grains, and fat-free or low-fat milk products.   Maintain a healthy body weight.  SEEK MEDICAL CARE IF:   You have an oral temperature above 102 F (38.9 C).   You develop diarrhea, vomiting, or any side effects from medicines.   You do not feel better in 24 hours, or you are getting worse.  SEEK IMMEDIATE MEDICAL CARE IF:   Your joint becomes suddenly more tender and you have:   Chills.   An oral temperature above 102 F (38.9 C), not controlled by medicine.  MAKE SURE YOU:   Understand these instructions.   Will watch your condition.   Will get help right away if you are not doing well or get worse.  Document Released: 01/26/2000 Document Revised: 01/17/2011 Document Reviewed: 05/08/2009 Exit[employer] Patient Information 2012 [employer]. 

## 2011-04-22 NOTE — Telephone Encounter (Signed)
Message copied by Duaine Dredge on Mon Apr 22, 2011  8:44 AM ------      Message from: Lavell Islam B      Created: Mon Feb 25, 2011  7:56 AM       Golden Hurter sent me this. This is something that you were working on. Not sure why I got it.            ----- Message -----         From: Pecola Lawless, MD         Sent: 02/24/2011  10:37 AM           To: Floydene Flock, August Saucer Negrete            Please  schedule fasting Labs as requested (see phone note) : BMET,Lipids, hepatic panel, CBC & dif,A1c, TSH.Codes: 285.9, thrombocytopenia, renal insufficiency, 790.29, 277.7, 995.20.

## 2011-04-29 ENCOUNTER — Ambulatory Visit (INDEPENDENT_AMBULATORY_CARE_PROVIDER_SITE_OTHER): Payer: Managed Care, Other (non HMO) | Admitting: Internal Medicine

## 2011-04-29 ENCOUNTER — Telehealth: Payer: Self-pay | Admitting: Internal Medicine

## 2011-04-29 ENCOUNTER — Encounter: Payer: Self-pay | Admitting: Internal Medicine

## 2011-04-29 DIAGNOSIS — D62 Acute posthemorrhagic anemia: Secondary | ICD-10-CM

## 2011-04-29 DIAGNOSIS — K269 Duodenal ulcer, unspecified as acute or chronic, without hemorrhage or perforation: Secondary | ICD-10-CM

## 2011-04-29 MED ORDER — OMEPRAZOLE 40 MG PO CPDR: 40.0000 mg | DELAYED_RELEASE_CAPSULE | Freq: Every day | ORAL | Status: DC

## 2011-04-29 NOTE — Patient Instructions (Signed)
We have sent the following medications to your pharmacy for you to pick up at your convenience:  Omeprazole  

## 2011-04-29 NOTE — Progress Notes (Signed)
HISTORY OF PRESENT ILLNESS:  [name] is a 71 y.o. male with multiple medical problems as listed below. He was last seen in the office 04/05/2011 when he walked in with acute upper GI bleeding. He was on Pradaxa for atrial fibrillation and had been using NSAIDs for gout. Also aspirin for his heart. He was hospitalized with a nadir hemoglobin of 7.5. Subsequent upper endoscopy performed 04/06/2011 revealed gastric erosions and duodenal ulcers with blood present. He was treated with PPI therapy and subsequently discharged home off Pradaxa and NSAIDs. He is currently on Nexium 40 mg twice a day. Anticipats resuming his Pradaxa in one to 2 weeks. He has had problems with gout for which he is on colchicine. No active GI complaints. He is going to [facility] this week for [employer] [place].  REVIEW OF SYSTEMS:  All non-GI ROS negative except for gout  Past Medical History  Diagnosis Date  . Hyperglycemia   . CAD (coronary artery disease)   . Hyperlipidemia   . Chronic atrial fibrillation     Dr. Clifton James  . Anemia   . Gilbert's syndrome   . Hypertension   . Hemorrhoids   . Cataract     BILATERAL  . Renal insufficiency   . Myocardial infarction     history of. 1983    Past Surgical History  Procedure Date  . Cath/angioplasy     [id number], [place] by Dr. Brett Canales [facility] of angioplasty)  . Carotid endarterectomy     bilaterally 2007  . Tonsillectomy   . No colonoscopy to date     ("I don't have a good excuse"). SOC reviewed  . Cataract extraction   . Pyloric stenosis   . Esophagogastroduodenoscopy 04/06/2011    Procedure: ESOPHAGOGASTRODUODENOSCOPY (EGD);  Surgeon: Theda Belfast, MD;  Location: [facility];  Service: Endoscopy;  Laterality: N/A;    Social History [name]  reports that he quit smoking about 30 years ago. His smoking use included Cigarettes. He smoked 3 packs per day. He has never used smokeless tobacco. He reports that he drinks about 9.6 ounces of alcohol  per week. He reports that he does not use illicit drugs.  family history includes Heart attack in his father and Heart disease in his father.  There is no history of Colon cancer.  No Known Allergies     PHYSICAL EXAMINATION: Vital signs: BP 126/68  Pulse 80  Ht 5\' 9"  (1.753 m)  Wt 191 lb (86.637 kg)  BMI 28.21 kg/m2 General: Well-developed, well-nourished, no acute distress HEENT: Sclerae are anicteric, conjunctiva pink. Oral mucosa intact Lungs: Clear Heart:irregular Abdomen: soft, nontender, nondistended, no obvious ascites, no peritoneal signs, normal bowel sounds. No organomegaly. Extremities: No edema Psychiatric: alert and oriented x3. Cooperative     ASSESSMENT:  #1. Recent acute GI bleed secondary to duodenal ulcers from NSAIDs. Resolved. On PPI. #2. Anemia secondary to acute blood loss. Most recent hemoglobin 10.4 on 04/16/2011 percent 3. Screening colonoscopy September 2012 revealing moderate sigmoid diverticulosis, otherwise normal. Internal hemorrhoids present.   PLAN:  #1. Continue twice a day Nexium for an additional week. Then switch to omeprazole 40 mg daily indefinitely. #2Molli Knock to use baby aspirin if necessary for cardioprotective purposes. However, he must say on current PPI. #3Molli Knock to resume anticoagulation therapy per cardiology. Again, would stay on concurrent PPI indefinitely to reduce the risk of recurrent ulcer formation #4. Routine followup surveillance colonoscopy 2022

## 2011-05-01 ENCOUNTER — Telehealth: Payer: Self-pay

## 2011-05-01 NOTE — Telephone Encounter (Signed)
Called for prior authorization for omeprazole; [employer] will fax answer back in a few days

## 2011-05-01 NOTE — Progress Notes (Signed)
Uric acid was not done?

## 2011-05-01 NOTE — Telephone Encounter (Signed)
Left message regarding prior authorization of omeprazole

## 2011-05-02 ENCOUNTER — Telehealth: Payer: Self-pay

## 2011-05-02 NOTE — Telephone Encounter (Signed)
Left message

## 2011-05-06 ENCOUNTER — Telehealth: Payer: Self-pay

## 2011-05-06 NOTE — Telephone Encounter (Signed)
Informed patient I would leave him some Prilosec samples while I continued to try to get prior authorization; Pt asked me to see if Dr. Marina Goodell would approve of him taking a 75mg  of indomethacin for his

## 2011-05-07 ENCOUNTER — Ambulatory Visit (INDEPENDENT_AMBULATORY_CARE_PROVIDER_SITE_OTHER): Payer: Managed Care, Other (non HMO) | Admitting: Internal Medicine

## 2011-05-07 ENCOUNTER — Telehealth: Payer: Self-pay

## 2011-05-07 ENCOUNTER — Encounter: Payer: Self-pay | Admitting: Internal Medicine

## 2011-05-07 VITALS — BP 140/82 | HR 84 | Temp 97.9°F | Wt 192.0 lb

## 2011-05-07 DIAGNOSIS — M109 Gout, unspecified: Secondary | ICD-10-CM | POA: Diagnosis not present

## 2011-05-07 MED ORDER — PREDNISONE 20 MG PO TABS: 20.0000 mg | ORAL_TABLET | Freq: Two times a day (BID) | ORAL | Status: AC

## 2011-05-07 NOTE — Patient Instructions (Signed)
The most common cause of elevated uric acid  is the ingestion of sugar from high fructose corn syrup sources added to processed foods & drinks.  Eat a low-fat diet with lots of fruits and vegetables, up to 7-9 servings per day. Consume less than 40 (preferably ZERO) grams of sugar per day from foods & drinks with High Fructose Corn Syrup (HFCS) sugar as #1,2,3 or # 4 on label.Whole Foods, [employer] & [employer] do not carry products with HFCS. Follow a  low carb nutrition program such as [place] or [employer]  to prevent Diabetes progression . White carbohydrates (potatoes, rice, bread, and pasta) have a high spike of sugar and a high load of sugar. For example a  baked potato has a cup of sugar and a  french fry  2 teaspoons of sugar. Yams, wild  rice, whole grained bread &  wheat pasta have been much lower spike and load of  sugar. Portions should be the size of a deck of cards or your palm.  No alcohol

## 2011-05-07 NOTE — Telephone Encounter (Signed)
Called rx for Omeprazole into pharmacy at [facility]; Called pt to let him know to pick it up later today; pt agreed

## 2011-05-07 NOTE — Progress Notes (Signed)
  Subjective:    Patient ID: [name], male    DOB: Oct 09, 1940, 70 y.o.   MRN: [id number]  HPI 04/22/11 office visit was reviewed. He has remained on colchicine 0.6 mg twice a day as well as allopurinol 100 mg twice a day. The gout improved until 3/25 when it flared in the right wrist and right forearm  He has had chills without fever or sweats.  He did take indomethacin; he was hospitalized recently with a bleeding ulcer. His gastroenterologist does not want him on nonsteroidals.  He's been having several drinks recently    Review of Systems  His last uric acid was 9.7 on 2/19. BUN was 10 and creatinine 1.2 on 04/16/11. His hematocrit is 31.5 up from a low of 23.6 on 2/24.No melena      Objective:   Physical Exam  He is in no acute distress but is uncomfortable.  The right hand is obviously swollen and slightly erythematous & is tender to touch. The erythema is in a glove distribution.  He has no lymphadenopathy about the neck or axilla        Assessment & Plan:  #1 probable acute gout; rule out cellulitis. Against the latter is is temperature 97.9 .  #2 recent ulcer with marked anemia and frank melena  Plan: Discontinue allopurinol totally. Continue the colchicine twice a day & a short course of prednisone. Repeat uric acid and a CBC and differential to rule out cellulitis. No alcohol

## 2011-05-07 NOTE — Telephone Encounter (Signed)
Called [name] to let him know Dr. Marina Goodell had said he could take one indomethacin to alleviate his gout pain but he also needs to follow up with his PCP.  [name] stated he is seeing Dr. Alwyn Ren today; told [name] I would continue to try to get his prilosec approved

## 2011-05-08 LAB — CBC WITH DIFFERENTIAL/PLATELET
Basophils Absolute: 0 10*3/uL (ref 0.0–0.1)
Basophils Relative: 0.1 % (ref 0.0–3.0)
Eosinophils Absolute: 0.2 10*3/uL (ref 0.0–0.7)
HCT: 31.5 % — ABNORMAL LOW (ref 39.0–52.0)
Hemoglobin: 10.6 g/dL — ABNORMAL LOW (ref 13.0–17.0)
Lymphocytes Relative: 9.7 % — ABNORMAL LOW (ref 12.0–46.0)
Lymphs Abs: 0.7 10*3/uL (ref 0.7–4.0)
MCHC: 33.7 g/dL (ref 30.0–36.0)
Monocytes Relative: 5.7 % (ref 3.0–12.0)
Neutro Abs: 6 10*3/uL (ref 1.4–7.7)
RBC: 3.34 Mil/uL — ABNORMAL LOW (ref 4.22–5.81)
RDW: 19.8 % — ABNORMAL HIGH (ref 11.5–14.6)

## 2011-05-15 ENCOUNTER — Telehealth: Payer: Self-pay | Admitting: Cardiovascular Disease

## 2011-05-15 DIAGNOSIS — I4891 Unspecified atrial fibrillation: Secondary | ICD-10-CM

## 2011-05-15 MED ORDER — DABIGATRAN ETEXILATE MESYLATE 150 MG PO CAPS: 150.0000 mg | ORAL_CAPSULE | Freq: Two times a day (BID) | ORAL | Status: DC

## 2011-05-15 NOTE — Telephone Encounter (Signed)
Pt calling re a change in pharmacy, [facility],  Needs refill of pradaxa

## 2011-05-15 NOTE — Telephone Encounter (Signed)
Fu call °Patient returning your call °

## 2011-05-15 NOTE — Telephone Encounter (Signed)
The patient is aware his refill has been sent.

## 2011-05-23 ENCOUNTER — Other Ambulatory Visit: Payer: Self-pay | Admitting: Internal Medicine

## 2011-05-23 MED ORDER — COLCHICINE 0.6 MG PO TABS: ORAL_TABLET | ORAL | Status: DC

## 2011-05-23 NOTE — Telephone Encounter (Signed)
I spoke with patient to question his request for Protonix for that will be the 3rd medication on his list for acid reflux. Patient states it was his understanding from Dr.Hopper that protonix helps with gout. Patient also mentions he was told to take protonix at his last OV with Dr.Hopper  Dr.Hopper please advise

## 2011-05-23 NOTE — Telephone Encounter (Signed)
patiet called & stated he need refills sent to [facility] for the following colchicine (COLCRYS) 0.6 MG tablet  (on chart) Last written 3.11.13 Last qty 60 Last instructions 1.2 mg po x1 then 0.6 1 hour later then 1 po qd  &   Printinix (not on chart) Advised patient he needed to call his pharmacy but he refused. He also stated these were the last 2-prescriptions he got from [place].   Patient phone# [phone]  Please call with any questions

## 2011-05-23 NOTE — Telephone Encounter (Signed)
Discuss with patient, who indicated that he will take the omeprazole until he runs out then he will start on the Nexium.

## 2011-05-23 NOTE — Telephone Encounter (Signed)
There is some concern that these agents may have a negative impact on bone integrity long-term. Either Nexium or omeprazole 40 mg should be used. Protonix would be duplicative and should not be added unless a gastroenterologist recommended this. Nexium 40 is the strongest of all these agents.

## 2011-05-28 NOTE — Telephone Encounter (Signed)
Completed.

## 2011-06-26 DIAGNOSIS — H53039 Strabismic amblyopia, unspecified eye: Secondary | ICD-10-CM | POA: Diagnosis not present

## 2011-06-26 DIAGNOSIS — Z961 Presence of intraocular lens: Secondary | ICD-10-CM | POA: Diagnosis not present

## 2011-09-19 ENCOUNTER — Telehealth: Payer: Self-pay | Admitting: Internal Medicine

## 2011-09-19 DIAGNOSIS — T887XXA Unspecified adverse effect of drug or medicament, initial encounter: Secondary | ICD-10-CM

## 2011-09-19 DIAGNOSIS — R7989 Other specified abnormal findings of blood chemistry: Secondary | ICD-10-CM

## 2011-09-19 DIAGNOSIS — M109 Gout, unspecified: Secondary | ICD-10-CM

## 2011-09-19 MED ORDER — ALLOPURINOL 100 MG PO TABS: 100.0000 mg | ORAL_TABLET | Freq: Every day | ORAL | Status: DC

## 2011-09-19 NOTE — Telephone Encounter (Signed)
R request for Allopurinol patient was last seen 05/07/11 and Rx filled 04/22/11 for gout # 60 with 2 refills. Please advise     KP

## 2011-09-19 NOTE — Telephone Encounter (Signed)
Refill: Allopurinol 100mg  tablet. Take 1 tablet daily for 1 week then 2 tablets daily. Qty 60. Last fill 08-14-11

## 2011-09-19 NOTE — Telephone Encounter (Signed)
Per Dr.Hopper allopurinol 1 by mouth daily until labs checked, dosage will be further addressed once lab results received

## 2011-09-19 NOTE — Telephone Encounter (Signed)
Allopurinol will aggravate acute gout. It should be taken prophylactically to keep the uric acid low. He should stay on the 100 mg daily, dispense 60.His renal function and uric acid level should be checked to verify efficacy and safety. BMET, uric acid.Codes: 274.9, 790.6, 995.20

## 2011-09-23 ENCOUNTER — Other Ambulatory Visit: Payer: Self-pay | Admitting: Internal Medicine

## 2011-09-23 MED ORDER — METOPROLOL SUCCINATE ER 100 MG PO TB24: 100.0000 mg | ORAL_TABLET | Freq: Every day | ORAL | Status: DC

## 2011-09-23 NOTE — Telephone Encounter (Signed)
Refill Metoprolol Succinate ER TAB SUC 100 MG Take 1 tablet (100 mg total) by mouth daily. Requesting a 90-day supply

## 2011-09-23 NOTE — Telephone Encounter (Signed)
RX sent

## 2011-10-24 ENCOUNTER — Other Ambulatory Visit: Payer: Self-pay | Admitting: Family Medicine

## 2011-10-24 ENCOUNTER — Other Ambulatory Visit: Payer: Self-pay | Admitting: General Practice

## 2011-10-24 MED ORDER — COLCHICINE 0.6 MG PO TABS: ORAL_TABLET | ORAL | Status: DC

## 2011-10-24 NOTE — Telephone Encounter (Signed)
Last Uric Acid level 5.0 ( checked 5 months ago), please advise if ok to fill

## 2011-10-24 NOTE — Telephone Encounter (Signed)
#  30 ; 1 qd

## 2011-11-08 ENCOUNTER — Telehealth: Payer: Self-pay | Admitting: Oncology

## 2011-11-08 NOTE — Telephone Encounter (Signed)
S/w the pt and he is aware of his nov appts °

## 2011-11-18 DIAGNOSIS — R259 Unspecified abnormal involuntary movements: Secondary | ICD-10-CM | POA: Diagnosis not present

## 2011-11-18 DIAGNOSIS — G9389 Other specified disorders of brain: Secondary | ICD-10-CM | POA: Diagnosis not present

## 2011-11-19 ENCOUNTER — Telehealth: Payer: Self-pay | Admitting: Cardiovascular Disease

## 2011-11-19 NOTE — Telephone Encounter (Signed)
New problem:  C/O in the am only he has episode of severe  Shaking   . Very cold, very thirsty.  often confuse when the episode is going on.  Last  1-2 hrs .

## 2011-11-19 NOTE — Telephone Encounter (Signed)
Spoke to patient's wife she stated for the past couple of months patient has been having episodes in which he gets extremely cold,shakes,unable to hold a glass,confusion.States these episodes happen randomly in mid morning and last appox 1 to 2 hours.States her son took patient to [facility]' office when patient was having a episode and saw Dr.Avva's partner.MRI in was ordered and done 11/18/11 at [facility].Wife wants Dr.McAlhany to know and she request appointment with Dr.McAlhany.Message sent to Dr.McAlhany's nurse.

## 2011-11-20 NOTE — Telephone Encounter (Signed)
Spoke with pt's wife who reports she was told by cardiologist in [place] that pt may be allergic to Pradaxa.  She states pt does not take Pradaxa on days he is having gout flare up. She states he has been having frequent problems with gout since March.  She is requesting appt with Dr. Clifton James to discuss possibly changing Pradaxa to a different medication and evaluation of symptoms pt has been having as described below.  I offered appt with Norma Fredrickson, NP for November 21, 2011.  Wife then handed phone to pt to see if he would like to come in for this appt.  Pt states he would be willing to come in for appt but does not feel like it is needed.  He states gout episodes are infrequent and the days he does have gout issues he does not take Pradaxa.  I then spoke with wife who feels pt downplays his "episodes" and she would like pt to be seen. Appt made for pt to see Norma Fredrickson, NP on November 21, 2011 at 9:45.

## 2011-11-21 ENCOUNTER — Encounter: Payer: Self-pay | Admitting: Nurse Practitioner

## 2011-11-21 ENCOUNTER — Ambulatory Visit (INDEPENDENT_AMBULATORY_CARE_PROVIDER_SITE_OTHER): Payer: Managed Care, Other (non HMO) | Admitting: Nurse Practitioner

## 2011-11-21 VITALS — BP 108/60 | HR 65 | Ht 69.0 in | Wt 172.4 lb

## 2011-11-21 DIAGNOSIS — R259 Unspecified abnormal involuntary movements: Secondary | ICD-10-CM

## 2011-11-21 DIAGNOSIS — R251 Tremor, unspecified: Secondary | ICD-10-CM

## 2011-11-21 NOTE — Progress Notes (Signed)
[name] Date of Birth: [birth year] Medical Record #[id number]  History of Present Illness: [name] is seen today for a work in visit. He is seen for Dr. Clifton James. He has multiple medical issues which include atrial fibrillation, hypertension, hyperlipidemia, coronary artery disease status post MI, and balloon angioplasty in 1980s, as well as peripheral vascular disease with bilateral carotid endarterectomies in 2005. He was last seen in our office in March of 2013. He has been on coumadin in the past but he stopped taking because of a rash and did not wish to restart. He has refused cardioversion or ablation in the past. He has never been aware of his irregular heart rhythm. He has also refused to escalate his medical therapy with antiarrythmic drugs. Pradaxa was started at the last visit in September 2012. He has been offered him carotid dopplers but he refused and wished to follow up with Dr. Hart Rochester. He was admitted to the GI service on 04/05/2011 with an acute upper GI bleed. He underwent upper endoscopy with Dr. Elnoria Howard the following morning and was found to have multiple shallow clean-based duodenal ulcers felt to be NSAID-induced. He did not require any endoscopic therapy. Did not have colonoscopy as well.    He comes in today. He is here with his son. He says he feels ok. Not having any cardiac issues. No chest pain. Not short of breath. Remains unaware of his atrial fib. Remains on Pradaxa. No active bleeding reported. Appetite is poor. Weight is down significantly. He has lost 25 pounds since March. He remains anemic. He has had some gout flare ups. His wife apparently thought he might be having a reaction to his Pradaxa. His biggest issue is that he has these "shaking tremors". They are sporadic. Occur 4 to 5 times a week. He gets extremely cold. Always in the 10 to 12 o'clock hour. His son says he shakes so bad that he cannot eat. Had an MRI of his head earlier this week. No acute  findings but has had some old strokes. Remains anemic. Hemoglobin 10.2. Albumin was also low at 2.9. He had not been taking his iron. Does have some stomach pains. No CT of the abdomen or pelvis that I can see. He is to see hematology soon. Says he is to see GI soon. Has switched his PCP to Dr. Felipa Eth.   Current Outpatient Prescriptions on File Prior to Visit  Medication Sig Dispense Refill  . amLODipine-benazepril (LOTREL) 10-40 MG per capsule Take 1 capsule by mouth every evening.      . dabigatran (PRADAXA) 150 MG CAPS Take 1 capsule (150 mg total) by mouth every 12 (twelve) hours.  60 capsule  6  . metoprolol succinate (TOPROL-XL) 100 MG 24 hr tablet Take 1 tablet (100 mg total) by mouth daily.  90 tablet  1  . omeprazole (PRILOSEC) 40 MG capsule Take 1 capsule (40 mg total) by mouth daily.  30 capsule  11  . rosuvastatin (CRESTOR) 20 MG tablet Take 20 mg by mouth as directed. Take 1 tab 2x/week on Mondays and Fridays      . DISCONTD: allopurinol (ZYLOPRIM) 100 MG tablet Take 1 tablet (100 mg total) by mouth daily.  30 tablet  0  . DISCONTD: colchicine (COLCRYS) 0.6 MG tablet 1.2 mg po x1 then 0.6 1 hour later then 1 po qd  60 tablet  1  . DISCONTD: COLCRYS 0.6 MG tablet TAKE 2 TABLETS X1 DOSE THEN 1 TAB 1 HOUR LATER  THEN 1 TABLET DAILY  30 tablet  1    No Known Allergies  Past Medical History  Diagnosis Date  . Hyperglycemia   . CAD (coronary artery disease)   . Hyperlipidemia   . Chronic atrial fibrillation     Dr. Clifton James  . Anemia   . Gilbert's syndrome   . Hypertension   . Hemorrhoids   . Cataract     BILATERAL  . Renal insufficiency   . Myocardial infarction     history of. 1983    Past Surgical History  Procedure Date  . Cath/angioplasy     1610,9604, [facility] by Dr. Brett Canales [facility] of angioplasty)  . Carotid endarterectomy     bilaterally 2007  . Tonsillectomy   . No colonoscopy to date     ("I don't have a good excuse"). SOC reviewed  . Cataract extraction   .  Pyloric stenosis   . Esophagogastroduodenoscopy 04/06/2011    Procedure: ESOPHAGOGASTRODUODENOSCOPY (EGD);  Surgeon: Theda Belfast, MD;  Location: [facility];  Service: Endoscopy;  Laterality: N/A;    History  Smoking status  . Former Smoker -- 3.0 packs/day  . Types: Cigarettes  . Quit date: 02/11/1981  Smokeless tobacco  . Never Used    History  Alcohol Use  . 9.6 oz/week  . 16 Shots of liquor per week    Family History  Problem Relation Age of Onset  . Heart attack Father   . Heart disease Father   . Colon cancer Neg Hx     Review of Systems: The review of systems is per the HPI.  All other systems were reviewed and are negative.  Physical Exam: BP 108/60  Pulse 65  Ht 5\' 9"  (1.753 m)  Wt 172 lb 6.4 oz (78.2 kg)  BMI 25.46 kg/m2  SpO2 97% Patient is in no acute distress. His affect is quite flat. Skin is warm and dry. Color is quite sallow.  His weight is down considerably since his last visit here (197 to 172). HEENT is unremarkable. Normocephalic/atraumatic. PERRL. Sclera are nonicteric. Neck is supple. No masses. No JVD. Lungs are clear. Cardiac exam shows an irregular rhythm. Rate is controlled. Abdomen is soft. Extremities are without edema. Gait and ROM are intact. No gross neurologic deficits noted.  LABORATORY DATA: Reviewed from Dr. Felipa Eth.  Assessment / Plan: 1. Shaking/chills/tremor - no actual fever. Very unclear as to the etiology. MRI with old strokes noted.   2. Significant weight loss and anemia - I would worry about the possibility of a malignancy. He may need a weight loss evaluation. I will defer to Dr. Felipa Eth.  I do not think this is from Pradaxa but  I offered to switch him to Xarelto. He has just had it filled and does not wish to change. His cardiac status seems to be stable from our standpoint, but I worry that there is something else going on with Mr. Norkus that has not been discovered. He is to see Dr. Felipa Eth on Monday and will discuss with  him.   Patient is agreeable to this plan and will call if any problems develop in the interim.

## 2011-11-21 NOTE — Telephone Encounter (Signed)
Thanks. cdm 

## 2011-11-21 NOTE — Patient Instructions (Addendum)
  I think you need to get your weight loss worked up.   I will send a note to Dr. Felipa Eth.  Call the [facility] office at [phone] if you have any questions, problems or concerns.

## 2011-11-22 ENCOUNTER — Telehealth: Payer: Self-pay | Admitting: Internal Medicine

## 2011-11-22 NOTE — Telephone Encounter (Signed)
Spoke with pts wife and let he know he had an ultrasound of the abdomen in Sept of 2012. Wife wanted to know if it would be helpful for his cardiologist to see the old ultrasound. Let wife know that we are all on the same electronic medical record and they will be able to see the ultrasound also.

## 2011-11-24 ENCOUNTER — Telehealth: Payer: Self-pay | Admitting: Gastroenterology

## 2011-11-24 ENCOUNTER — Encounter (HOSPITAL_COMMUNITY): Payer: Self-pay | Admitting: Emergency Medicine

## 2011-11-24 ENCOUNTER — Emergency Department (HOSPITAL_COMMUNITY)
Admission: EM | Admit: 2011-11-24 | Discharge: 2011-11-24 | Disposition: A | Discharge status: Home / Self Care | Payer: Managed Care, Other (non HMO) | Attending: Emergency Medicine | Admitting: Emergency Medicine

## 2011-11-24 DIAGNOSIS — I4891 Unspecified atrial fibrillation: Secondary | ICD-10-CM | POA: Diagnosis not present

## 2011-11-24 DIAGNOSIS — Z87891 Personal history of nicotine dependence: Secondary | ICD-10-CM | POA: Diagnosis not present

## 2011-11-24 DIAGNOSIS — I252 Old myocardial infarction: Secondary | ICD-10-CM | POA: Diagnosis not present

## 2011-11-24 DIAGNOSIS — R7309 Other abnormal glucose: Secondary | ICD-10-CM | POA: Diagnosis not present

## 2011-11-24 DIAGNOSIS — I10 Essential (primary) hypertension: Secondary | ICD-10-CM | POA: Diagnosis not present

## 2011-11-24 DIAGNOSIS — I2510 Atherosclerotic heart disease of native coronary artery without angina pectoris: Secondary | ICD-10-CM | POA: Insufficient documentation

## 2011-11-24 DIAGNOSIS — N289 Disorder of kidney and ureter, unspecified: Secondary | ICD-10-CM | POA: Insufficient documentation

## 2011-11-24 DIAGNOSIS — E785 Hyperlipidemia, unspecified: Secondary | ICD-10-CM | POA: Diagnosis not present

## 2011-11-24 DIAGNOSIS — D649 Anemia, unspecified: Secondary | ICD-10-CM | POA: Diagnosis not present

## 2011-11-24 DIAGNOSIS — Z79899 Other long term (current) drug therapy: Secondary | ICD-10-CM | POA: Insufficient documentation

## 2011-11-24 LAB — PROTIME-INR
INR: 1.17 (ref 0.00–1.49)
Prothrombin Time: 14.7 seconds (ref 11.6–15.2)

## 2011-11-24 LAB — APTT: aPTT: 50 seconds — ABNORMAL HIGH (ref 24–37)

## 2011-11-24 LAB — COMPREHENSIVE METABOLIC PANEL
ALT: 7 U/L (ref 0–53)
AST: 11 U/L (ref 0–37)
Albumin: 2.9 g/dL — ABNORMAL LOW (ref 3.5–5.2)
Calcium: 9 mg/dL (ref 8.4–10.5)
Creatinine, Ser: 1 mg/dL (ref 0.50–1.35)
Sodium: 136 mEq/L (ref 135–145)

## 2011-11-24 LAB — CBC
MCH: 31.3 pg (ref 26.0–34.0)
MCV: 94.6 fL (ref 78.0–100.0)
Platelets: 182 10*3/uL (ref 150–400)
RDW: 16.3 % — ABNORMAL HIGH (ref 11.5–15.5)
WBC: 8.5 10*3/uL (ref 4.0–10.5)

## 2011-11-24 LAB — TYPE AND SCREEN: Antibody Screen: NEGATIVE

## 2011-11-24 LAB — OCCULT BLOOD, POC DEVICE: Fecal Occult Bld: NEGATIVE

## 2011-11-24 NOTE — Telephone Encounter (Signed)
On call note at 1020. Pt had a black tarry stool this morning. No other symptoms. Had DU with bleed earlier this year. Taking Pradaxa and omeprazole. He has multiple medical problems. Occasionally takes Indocin for gout but none recently. Advised to have someone take him to the nearest ED immediately for evaluation.

## 2011-11-24 NOTE — ED Notes (Signed)
Pt presenting to ed with c/o black tarry stool this morning. Pt denies nausea, vomiting and diarrhea. Pt denies dizziness and chest pain at this time. Pt states he was told to present to ED by his pcp

## 2011-11-24 NOTE — ED Provider Notes (Signed)
History     CSN: [id number]  Arrival date & time 11/24/11  1123   First MD Initiated Contact with Patient 11/24/11 1140      Chief Complaint  Patient presents with  . Rectal Bleeding    (Consider location/radiation/quality/duration/timing/severity/associated sxs/prior treatment) HPI Pt presents with c/o black stool. He states he first noted this 2 days ago, a small amount and it has become more prominent this morning.  No change in conistency or frequency of bowel movements.  No dizziness or fainting.  No abdominal pain, no fever.  No nausea/vomiting/diarrhea.  He did start taking iron several days ago for anemia.  Has hx of gastric ulcer.  Also takes pradaxa for atrial fibrillation.  There are no other associated systemic symptoms, there are no other alleviating or modifying factors.   Past Medical History  Diagnosis Date  . Hyperglycemia   . CAD (coronary artery disease)   . Hyperlipidemia   . Chronic atrial fibrillation     Dr. Clifton James  . Anemia   . Gilbert's syndrome   . Hypertension   . Hemorrhoids   . Cataract     BILATERAL  . Renal insufficiency   . Myocardial infarction     history of. 1983    Past Surgical History  Procedure Date  . Cath/angioplasy     [id number], [facility] by Dr. Brett Canales [facility] of angioplasty)  . Carotid endarterectomy     bilaterally 2007  . Tonsillectomy   . No colonoscopy to date     ("I don't have a good excuse"). SOC reviewed  . Cataract extraction   . Pyloric stenosis   . Esophagogastroduodenoscopy 04/06/2011    Procedure: ESOPHAGOGASTRODUODENOSCOPY (EGD);  Surgeon: Theda Belfast, MD;  Location: [facility];  Service: Endoscopy;  Laterality: N/A;    Family History  Problem Relation Age of Onset  . Heart attack Father   . Heart disease Father   . Colon cancer Neg Hx     History  Substance Use Topics  . Smoking status: Former Smoker -- 3.0 packs/day    Types: Cigarettes    Quit date: 02/11/1981  . Smokeless tobacco: Never  Used  . Alcohol Use: 9.6 oz/week    16 Shots of liquor per week     daily      Review of Systems ROS reviewed and all otherwise negative except for mentioned in HPI  Allergies  Review of patient's allergies indicates no known allergies.  Home Medications   Current Outpatient Rx  Name Route Sig Dispense Refill  . ALLOPURINOL 100 MG PO TABS Oral Take 100 mg by mouth as needed.    Marland Kitchen AMLODIPINE BESY-BENAZEPRIL HCL 10-40 MG PO CAPS Oral Take 1 capsule by mouth every evening.    Marland Kitchen COLCHICINE 0.6 MG PO TABS Oral Take 0.6 mg by mouth as needed. Arthritis    . DABIGATRAN ETEXILATE MESYLATE 150 MG PO CAPS Oral Take 150 mg by mouth every 12 (twelve) hours.    Di Kindle SULFATE 325 (65 FE) MG PO TABS Oral Take 325 mg by mouth daily with breakfast.    . METOPROLOL SUCCINATE ER 100 MG PO TB24 Oral Take 100 mg by mouth daily.    Marland Kitchen OMEPRAZOLE 40 MG PO CPDR Oral Take 40 mg by mouth daily.    Marland Kitchen ROSUVASTATIN CALCIUM 20 MG PO TABS Oral Take 20 mg by mouth as directed. Take 1 tab 2x/week on Mondays and Fridays      BP 141/77  Pulse 91  Temp  97.8 F (36.6 C) (Oral)  SpO2 98% Vitals reviewed Physical Exam Physical Examination: General appearance - alert, well appearing, and in no distress Mental status - alert, oriented to person, place, and time Eyes - pupils equal and reactive, extraocular eye movements intact Mouth - mucous membranes moist, pharynx normal without lesions Chest - clear to auscultation, no wheezes, rales or rhonchi, symmetric air entry Heart - normal rate, regular rhythm, normal S1, S2, no murmurs, rubs, clicks or gallops Abdomen - soft, nontender, nondistended, no masses or organomegaly Rectal - negative without mass, lesions or tenderness, dark stool, guaic negative Extremities - peripheral pulses normal, no pedal edema, no clubbing or cyanosis Skin - normal coloration and turgor, no rashes, no suspicious skin lesions noted  ED Course  Procedures (including critical care  time)  2:58 PM  Pt with improved anemia from prior, he has recently started taking iron and hemocult is negative.  Labs Reviewed  CBC - Abnormal; Notable for the following:    RBC 3.68 (*)     Hemoglobin 11.5 (*)     HCT 34.8 (*)     RDW 16.3 (*)     All other components within normal limits  COMPREHENSIVE METABOLIC PANEL - Abnormal; Notable for the following:    Albumin 2.9 (*)     GFR calc non Af Amer 74 (*)     GFR calc Af Amer 85 (*)     All other components within normal limits  APTT - Abnormal; Notable for the following:    aPTT 50 (*)     All other components within normal limits  TYPE AND SCREEN  PROTIME-INR  OCCULT BLOOD, POC DEVICE  LAB REPORT - SCANNED  OCCULT BLOOD X 1 CARD TO LAB, STOOL   No results found.   1. Anemia       MDM  Pt presenting with c/o dark stool. Has recently started on iron.  Pt has anemia- hgb today is improved from his baseline.  Stool is hemoccult negative and his vitals are stable.  No abdominal pain.  Pt is to f/u with his PMD tomorrow.  Discharged with strict return precautions.  Pt agreeable with plan.        Ethelda Chick, MD 11/25/11 [phone]

## 2011-11-25 ENCOUNTER — Telehealth: Payer: Self-pay | Admitting: Internal Medicine

## 2011-11-25 DIAGNOSIS — I4891 Unspecified atrial fibrillation: Secondary | ICD-10-CM | POA: Diagnosis not present

## 2011-11-25 DIAGNOSIS — I10 Essential (primary) hypertension: Secondary | ICD-10-CM | POA: Diagnosis not present

## 2011-11-25 DIAGNOSIS — R634 Abnormal weight loss: Secondary | ICD-10-CM | POA: Diagnosis not present

## 2011-11-25 DIAGNOSIS — R259 Unspecified abnormal involuntary movements: Secondary | ICD-10-CM | POA: Diagnosis not present

## 2011-11-25 NOTE — Telephone Encounter (Signed)
Pt called Dr. Russella Dar over the weekend due to black, dark stool. Pt was advised to go to the ER due to pt being hospitalized due to bleed earlier in the year. Pt went to the ER, stools were negative for blood and his Hgb was better than his baseline. Pt states that he recently started taking and oral iron supplement about 5 days ago and it was decided that was why his stools were dark. Pt states he did not know it could cause the change in stool color. Pt wanted to let Dr. Marina Goodell know what had happened and that he is ok.

## 2011-11-25 NOTE — Telephone Encounter (Signed)
Tell him that I am glad that he had it evaluated to be certain. Also, glad that everything was ok. I really appreciate the follow up. Thanks

## 2011-11-25 NOTE — Telephone Encounter (Signed)
Pt aware.

## 2011-11-29 DIAGNOSIS — I10 Essential (primary) hypertension: Secondary | ICD-10-CM | POA: Diagnosis not present

## 2011-12-03 ENCOUNTER — Other Ambulatory Visit: Payer: Self-pay | Admitting: Internal Medicine

## 2011-12-03 MED ORDER — ROSUVASTATIN CALCIUM 20 MG PO TABS: 20.0000 mg | ORAL_TABLET | ORAL | Status: DC

## 2011-12-03 NOTE — Telephone Encounter (Signed)
refill Crestor Tab 20MG  Take 1 tablet 4 times a week--last wrt 7.17.12-last ov 3.26.13-acute  NOTE last wrt 7.17.12 with the following directions Take 20 mg by mouth as directed. Take 1 tab 2x/week on Mondays and Fridays

## 2011-12-03 NOTE — Telephone Encounter (Signed)
RX sent

## 2011-12-04 ENCOUNTER — Other Ambulatory Visit: Payer: Self-pay | Admitting: Internal Medicine

## 2011-12-04 MED ORDER — AMLODIPINE BESY-BENAZEPRIL HCL 10-40 MG PO CAPS: 1.0000 | ORAL_CAPSULE | Freq: Every evening | ORAL | Status: DC

## 2011-12-04 NOTE — Telephone Encounter (Signed)
refill Amlod/Benazp 10-40MG  take 1 capsule daily not qty or last fill date--last ov 3.26.13

## 2011-12-05 ENCOUNTER — Other Ambulatory Visit: Payer: Self-pay

## 2011-12-05 DIAGNOSIS — K639 Disease of intestine, unspecified: Secondary | ICD-10-CM | POA: Diagnosis not present

## 2011-12-05 NOTE — Telephone Encounter (Signed)
Adam call from [facility] needed Rx instructions for Crestor. I called them back and I had to give them a verbal order for crestor script to pharmacist.  Rx done.      MW

## 2011-12-06 ENCOUNTER — Telehealth: Payer: Self-pay | Admitting: Internal Medicine

## 2011-12-06 NOTE — Telephone Encounter (Signed)
Pt scheduled for OV per Dr. Marina Goodell 12/09/11@3 :30pm. Records requested from [facility].

## 2011-12-06 NOTE — Telephone Encounter (Signed)
Pt aware of appt. Records to Dr. Marina Goodell.   Left message for pt to call back. Dr. Marina Goodell would like pt to bring in disc of CT scan for him to have the radiologist review it. Dr. Marina Goodell would like the disc today.  Pt called back and states he will bring the disc today after 4pm.

## 2011-12-06 NOTE — Telephone Encounter (Signed)
[name], Dr. Felipa Eth call me regarding [name]. I need to see him in the office next week. You can make him and it on for Monday at 3:30 (I have no opening that day) or Wednesday at 11 AM is an open spot for me. Also, he had a CT scan at [facility] and possibly blood work from [facility]. Please obtain that information and place in my in box. Let me know what appointment he takes. Thanks

## 2011-12-09 ENCOUNTER — Ambulatory Visit (INDEPENDENT_AMBULATORY_CARE_PROVIDER_SITE_OTHER): Payer: Managed Care, Other (non HMO) | Admitting: Internal Medicine

## 2011-12-09 ENCOUNTER — Encounter: Payer: Self-pay | Admitting: Internal Medicine

## 2011-12-09 VITALS — BP 98/64 | HR 80 | Ht 68.5 in | Wt 167.5 lb

## 2011-12-09 DIAGNOSIS — I10 Essential (primary) hypertension: Secondary | ICD-10-CM | POA: Diagnosis not present

## 2011-12-09 DIAGNOSIS — K5730 Diverticulosis of large intestine without perforation or abscess without bleeding: Secondary | ICD-10-CM

## 2011-12-09 DIAGNOSIS — R259 Unspecified abnormal involuntary movements: Secondary | ICD-10-CM | POA: Diagnosis not present

## 2011-12-09 DIAGNOSIS — R634 Abnormal weight loss: Secondary | ICD-10-CM | POA: Diagnosis not present

## 2011-12-09 DIAGNOSIS — R933 Abnormal findings on diagnostic imaging of other parts of digestive tract: Secondary | ICD-10-CM | POA: Diagnosis not present

## 2011-12-09 DIAGNOSIS — K5732 Diverticulitis of large intestine without perforation or abscess without bleeding: Secondary | ICD-10-CM

## 2011-12-09 DIAGNOSIS — I4891 Unspecified atrial fibrillation: Secondary | ICD-10-CM | POA: Diagnosis not present

## 2011-12-09 MED ORDER — CIPROFLOXACIN HCL 500 MG PO TABS: 500.0000 mg | ORAL_TABLET | Freq: Two times a day (BID) | ORAL | Status: DC

## 2011-12-09 MED ORDER — METRONIDAZOLE 500 MG PO TABS: 500.0000 mg | ORAL_TABLET | Freq: Two times a day (BID) | ORAL | Status: DC

## 2011-12-09 NOTE — Patient Instructions (Addendum)
We have sent the following medications to your pharmacy for you to pick up at your convenience: Flagyl, Cipro  You have been scheduled for a CT scan of the abdomen and pelvis at [facility] on [facility], ph # [phone] You are scheduled on 12-25-11 at 1:30pm. You should arrive 15 minutes prior to your appointment time for registration. Please follow the written instructions below on the day of your exam:  WARNING: IF YOU ARE ALLERGIC TO IODINE/X-RAY DYE, PLEASE NOTIFY RADIOLOGY IMMEDIATELY AT [phone]! YOU WILL BE GIVEN A 13 HOUR PREMEDICATION PREP.  1) Do not eat or drink anything after 9:30am (4 hours prior to your test) 2) You have been given 2 bottles of oral contrast to drink. The solution may taste better if refrigerated, but do NOT add ice or any other liquid to this solution. Shake well before drinking.    Drink 1 bottle of contrast @ 12:00pm (2 hours prior to your exam)  Drink 1 bottle of contrast @ 12:45pm (1 hour prior to your exam)  You may take any medications as prescribed with a small amount of water except for the following: Metformin, Glucophage, Glucovance, Avandamet, Riomet, Fortamet, Actoplus Met, Janumet, Glumetza or Metaglip. The above medications must be held the day of the exam AND 48 hours after the exam.  The purpose of you drinking the oral contrast is to aid in the visualization of your intestinal tract. The contrast solution may cause some diarrhea. Before your exam is started, you will be given a small amount of fluid to drink. Depending on your individual set of symptoms, you may also receive an intravenous injection of x-ray contrast/dye. Plan on being at [facility] for 30 minutes or long, depending on the type of exam you are having performed.  If you have any questions regarding your exam or if you need to reschedule, you may call the CT department at [phone] between the hours of 8:00 am and 5:00 pm,  Monday-Friday.  ________________________________________________________________________

## 2011-12-09 NOTE — Progress Notes (Signed)
HISTORY OF PRESENT ILLNESS:  [name] Mcdonald is a 71 y.o. male with multiple medical problems as listed below. He presents today regarding weight loss and abnormal CT imaging. In February 2013 he was hospitalized with upper GI bleeding secondary to duodenal ulcers (upper endoscopy with [name] Mcdonald 04/06/2011). At that time, he was taking NSAIDs for [name] gout. As well, he was on Pradaxa for atrial fibrillation. I saw him in followup, post hospitalization, 04/29/2011. It is not clear that he was tested for H. pylori. At that time he was doing well. PPI indefinitely recommended as well as caution with regards to NSAIDs outside of cardioprotective aspirin. He subsequently contacted my partner few weeks ago concerned about dark stools. He had recently started iron. He went to the emergency room and underwent evaluation. Stool was dark, but Hemoccult negative. Hemoglobin 11.5. [name] dark stools were deemed secondary to iron. He recently saw [name] primary provider, [name] Mcdonald, and was found to have 25 pound weight loss since March. Review of laboratories from 11/24/2011 showed normal comprehensive metabolic panel except for albumin of 2.9. CBC normal with hemoglobin 11.5. Normal coagulation studies as well. Laboratories from 11/29/2011 refill normal comprehensive metabolic panel except for albumin 3.2. CBC revealed white blood cell count 12.1 with hemoglobin 11.0. Normal MCV. He did undergo an MRI of the brain 11/18/2011 (to evaluate involuntary movements) chronic changes noted. Chest x-ray (former smoker) 11/25/2011 was unremarkable. Finally, CT scan of the abdomen and pelvis performed October 24 revealed a focal lesion the mid sigmoid colon highly concerning for abscess. Adjacent diverticular disease noted. Incidental mild splenomegaly and fat-containing right inguinal hernia mentioned. I reviewed the CT scan with [name] Mcdonald this afternoon. [name] Mcdonald felt that the findings most likely represented inflamed sigmoid  diverticulum inferior to the sigmoid colon and adjacent to the urinary bladder. There were surrounding inflammatory changes, but no drainable abscess at this point. He did not feel that this was suspicious for neoplasm. Patient did undergo complete colonoscopy for the purposes of screening 10/29/2010. The preparation was excellent and the exam completed. He was noted to have moderate sigmoid diverticulosis and internal hemorrhoids. Routine followup in 10 years recommended. We did diagnose atrial fibrillation with rapid ventricular response that day and recommended cardiology followup. Also, for thrombocytopenia, he was seen by hematology. The patient is accompanied today by [name] Mcdonald. They report poor appetite over the past 6 months. He also reports a 3 month history of what sounds like shakes or chills most mornings. Occasionally associated with disorientation. He denies fever or, pain. Bowel habits can be a bit loose. Some cramping discomfort pre-defecation. No rectal bleeding. No urinary complaints.  REVIEW OF SYSTEMS:  All non-GI ROS negative except for excessive thirst  Past Medical History  Diagnosis Date  . Hyperglycemia   . CAD (coronary artery disease)   . Hyperlipidemia   . Chronic atrial fibrillation     [name] Mcdonald  . Anemia   . Gilbert's syndrome   . Hypertension   . Hemorrhoids   . Cataract     BILATERAL  . Renal insufficiency   . Myocardial infarction     history of. 1983  . AAA (abdominal aortic aneurysm)   . Duodenal ulcer   . History of GI bleed     Past Surgical History  Procedure Date  . Cath/angioplasy     [id number],0102, [facility] by [name] Mcdonald [facility] of angioplasty)  . Carotid endarterectomy     bilaterally 2007  . Tonsillectomy   .  No colonoscopy to date     ("I don't have a good excuse"). SOC reviewed  . Cataract extraction   . Pyloric stenosis   . Esophagogastroduodenoscopy 04/06/2011    Procedure: ESOPHAGOGASTRODUODENOSCOPY (EGD);  Surgeon: Theda Belfast,  MD;  Location: [facility];  Service: Endoscopy;  Laterality: N/A;    Social History [name] Mcdonald  reports that he quit smoking about 30 years ago. [name] smoking use included Cigarettes. He smoked 3 packs per day. He has never used smokeless tobacco. He reports that he drinks about 9.6 ounces of alcohol per week. He reports that he does not use illicit drugs.  family history includes Heart attack in [name] father and Heart disease in [name] father.  There is no history of Colon cancer.  No Known Allergies     PHYSICAL EXAMINATION: Vital signs: BP 98/64  Pulse 80  Ht 5' 8.5" (1.74 m)  Wt 167 lb 8 oz (75.978 kg)  BMI 25.10 kg/m2  Constitutional: thin from last visit, somewhat weak-appearing, no acute distress Psychiatric: alert and oriented x3, cooperative Eyes: extraocular movements intact, anicteric, conjunctiva pink Mouth: oral pharynx moist, no lesions Neck: supple no lymphadenopathy Cardiovascular: heart regular rate and rhythm, no murmur Lungs: clear to auscultation bilaterally Abdomen: soft, nontender, nondistended, no obvious ascites, no peritoneal signs, normal bowel sounds, no organomegaly Rectal:ommitted Extremities: no lower extremity edema bilaterally Skin: no lesions on visible extremities Neuro: No focal deficits. No asterixis.    ASSESSMENT:  #1. CT scan of the abdomen and pelvis suggesting diverticulitis with focal infection. This may explain part anorexia and what sounds like rigors. Certainly cannot rule out underlying neoplasm, though seems less likely based on the CT scan appearance and colonoscopy just 1 year previous. #2. History of bleeding duodenal ulcers presumably due to NSAIDs #3. Weight loss. See #1 #4. Multiple general medical problems   PLAN:  #1. Metronidazole 500 mg by mouth twice a day x14 days. Prescribed. Advised to strictly avoid alcohol concurrent with this medication #2. Ciprofloxacin 500 mg by mouth twice a day x14 days. Prescribed #3.  Repeat CT scan in 2 weeks after completing antibiotic therapy #4. Daily PPI #5. Check Helicobacter pylori status #6. May need to consider relook colonoscopy depending upon [name] response to therapy sent followup imaging results.  Discussed all the above in detail with the patient and [name] Mcdonald.

## 2011-12-20 ENCOUNTER — Other Ambulatory Visit: Payer: Self-pay | Admitting: *Deleted

## 2011-12-20 DIAGNOSIS — D649 Anemia, unspecified: Secondary | ICD-10-CM

## 2011-12-20 DIAGNOSIS — D692 Other nonthrombocytopenic purpura: Secondary | ICD-10-CM

## 2011-12-23 ENCOUNTER — Other Ambulatory Visit (HOSPITAL_BASED_OUTPATIENT_CLINIC_OR_DEPARTMENT_OTHER): Payer: Managed Care, Other (non HMO) | Admitting: Lab

## 2011-12-23 ENCOUNTER — Ambulatory Visit (HOSPITAL_BASED_OUTPATIENT_CLINIC_OR_DEPARTMENT_OTHER): Payer: Managed Care, Other (non HMO) | Admitting: Oncology

## 2011-12-23 ENCOUNTER — Telehealth: Payer: Self-pay | Admitting: Oncology

## 2011-12-23 VITALS — BP 142/77 | HR 97 | Temp 97.0°F | Resp 20 | Ht 68.5 in | Wt 165.2 lb

## 2011-12-23 DIAGNOSIS — D539 Nutritional anemia, unspecified: Secondary | ICD-10-CM | POA: Diagnosis not present

## 2011-12-23 DIAGNOSIS — D696 Thrombocytopenia, unspecified: Secondary | ICD-10-CM | POA: Diagnosis not present

## 2011-12-23 DIAGNOSIS — N259 Disorder resulting from impaired renal tubular function, unspecified: Secondary | ICD-10-CM

## 2011-12-23 DIAGNOSIS — R161 Splenomegaly, not elsewhere classified: Secondary | ICD-10-CM | POA: Diagnosis not present

## 2011-12-23 DIAGNOSIS — D649 Anemia, unspecified: Secondary | ICD-10-CM

## 2011-12-23 DIAGNOSIS — N289 Disorder of kidney and ureter, unspecified: Secondary | ICD-10-CM | POA: Diagnosis not present

## 2011-12-23 DIAGNOSIS — D692 Other nonthrombocytopenic purpura: Secondary | ICD-10-CM

## 2011-12-23 LAB — CBC & DIFF AND RETIC
Basophils Absolute: 0.1 10*3/uL (ref 0.0–0.1)
Eosinophils Absolute: 0.1 10*3/uL (ref 0.0–0.5)
HCT: 35.1 % — ABNORMAL LOW (ref 38.4–49.9)
HGB: 11.3 g/dL — ABNORMAL LOW (ref 13.0–17.1)
LYMPH%: 23.5 % (ref 14.0–49.0)
MONO#: 0.4 10*3/uL (ref 0.1–0.9)
NEUT#: 4.2 10*3/uL (ref 1.5–6.5)
Platelets: 166 10*3/uL (ref 140–400)
RBC: 3.62 10*6/uL — ABNORMAL LOW (ref 4.20–5.82)
Retic %: 1.48 % (ref 0.80–1.80)
WBC: 6.3 10*3/uL (ref 4.0–10.3)

## 2011-12-23 NOTE — Telephone Encounter (Signed)
gv and printed pt appt schedule for NOV 2014 °

## 2011-12-23 NOTE — Progress Notes (Signed)
Hematology and Oncology Follow Up Visit  [name] [id number] 01/30/1941 71 y.o. 12/23/2011 6:40 PM   Principle Diagnosis: Encounter Diagnoses  Name Primary?  . ANEMIA-NOS Yes  . 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  Normal B12 and folic acid levels.  High normal monocyte percentage on differential.  Significant chronic alcohol use.  An abdominal ultrasound did not show any gross abnormalities of the liver and no splenomegaly. Physical exam was unremarkable. Vitamin studies and thyroid functions were normal. He had fluctuating thrombocytopenia but values no lower than 108,000 in the past. I considered alcohol use, gout medications including chronic allopurinol and colchicine use as well as an early myelodysplastic syndrome in my main differential.  He elected to make some lifestyle changes and decrease his alcohol intake and did not want to have a bone marrow biopsy done at that point.  Since his visit here last October, he has had a short hospital admission February 22 through February 25 for what was felt to be a nonsteroidal-induced upper GI bleed with findings of multiple shallow duodenal ulcers on endoscopy. Situation complicated by chronic anticoagulation with Pradaxa for chronic atrial fibrillation.  More recently, just 2 weeks ago, he was evaluated for progressive weight loss and lower abdominal pain. Most recent colonoscopy done 10/29/2010 was completely normal. Therefore, it was not repeated. He denied any hematochezia. Stools have been dark due to iron supplements. A CT scan of the abdomen and pelvis done 12/05/2011 now shows mild splenomegaly. In addition, it showed thickening and inflammatory changes in the mid sigmoid felt to be consistent with acute diverticulitis with possible  abscess formation. He was started on Cipro plus Flagyl. His GI symptoms have resolved significantly and his appetite has returned. He is starting to gain back some of the weight that he lost.   Medications: reviewed  Allergies: No Known Allergies  Review of Systems: Constitutional:   See above Respiratory: No cough or dyspnea Cardiovascular:  No chest pain or palpitations Gastrointestinal: See above Genito-Urinary: No urinary tract symptoms Musculoskeletal: No muscle or bone pain Neurologic: No headache or change in vision Skin: No rash Remaining ROS negative.  Physical Exam: Blood pressure 142/77, pulse 97, temperature 97 F (36.1 C), temperature source Oral, resp. rate 20, height 5' 8.5" (1.74 m), weight 165 lb 3.2 oz (74.934 kg). Wt Readings from Last 3 Encounters:  12/23/11 165 lb 3.2 oz (74.934 kg)  12/09/11 167 lb 8 oz (75.978 kg)  11/21/11 172 lb 6.4 oz (78.2 kg)     General appearance: Thin but adequately nourished Caucasian man HENNT: Pharynx no erythema or exudate Lymph nodes: No lymphadenopathy Breasts: Lungs: Clear to auscultation resonant to percussion Heart: Regular rhythm no murmur Abdomen: Soft, nontender, no mass, no organomegaly Extremities: No edema, no calf tenderness Vascular: No cyanosis Neurologic: No focal deficit except significant decrease in vibration sensation over the fingertips but tuning fork exam Skin: No rash or ecchymosis  Lab Results: Lab Results  Component Value Date   WBC 6.3 12/23/2011   HGB 11.3* 12/23/2011   HCT 35.1* 12/23/2011   MCV 97.0 12/23/2011   PLT 166 12/23/2011     Chemistry      Component Value Date/Time   NA 136 11/24/2011 1200   K 3.8 11/24/2011 1200   CL 99 11/24/2011 1200   CO2 25  11/24/2011 1200   BUN 8 11/24/2011 1200   CREATININE 1.00 11/24/2011 1200      Component Value Date/Time   CALCIUM 9.0 11/24/2011 1200   ALKPHOS 107 11/24/2011 1200   AST 11 11/24/2011 1200   ALT 7 11/24/2011 1200   BILITOT  0.8 11/24/2011 1200       Radiological Studies: See discussion above   Impression and Plan: Multifactorial macrocytic anemia with intermittent mild thrombocytopenia. Hemoglobin has been stable over 1 year observation with some fluctuations back in February due to an acute upper GI bleed.. Repeat platelet counts x2 over the last 6 months have been normal. There is now mild splenomegaly on the CT scan of the abdomen without a clinically palpable spleen. 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  Recommendation: I'm  comfortable with  continued observation alone at this point. He agreed to see me again in one year.   CC:. Dr. Daleen Bo; Dr. Yancey Flemings; Dr. Verne Carrow   Levert Feinstein, MD 11/11/20136:40 PM

## 2011-12-25 DIAGNOSIS — K5732 Diverticulitis of large intestine without perforation or abscess without bleeding: Secondary | ICD-10-CM | POA: Diagnosis not present

## 2011-12-30 ENCOUNTER — Telehealth: Payer: Self-pay | Admitting: Internal Medicine

## 2011-12-30 DIAGNOSIS — R634 Abnormal weight loss: Secondary | ICD-10-CM

## 2011-12-30 DIAGNOSIS — R933 Abnormal findings on diagnostic imaging of other parts of digestive tract: Secondary | ICD-10-CM

## 2011-12-30 DIAGNOSIS — L0291 Cutaneous abscess, unspecified: Secondary | ICD-10-CM

## 2011-12-30 DIAGNOSIS — K5730 Diverticulosis of large intestine without perforation or abscess without bleeding: Secondary | ICD-10-CM

## 2011-12-30 NOTE — Telephone Encounter (Signed)
Pt called requesting the results of his CT scan. Called [facility] for report to be faxed over.  Ct Scan on Dr. Lamar Sprinkles desk for review.

## 2012-01-03 ENCOUNTER — Telehealth (INDEPENDENT_AMBULATORY_CARE_PROVIDER_SITE_OTHER): Payer: Self-pay

## 2012-01-03 ENCOUNTER — Telehealth: Payer: Self-pay | Admitting: Internal Medicine

## 2012-01-03 MED ORDER — AMOXICILLIN-POT CLAVULANATE 875-125 MG PO TABS: 1.0000 | ORAL_TABLET | Freq: Two times a day (BID) | ORAL | Status: DC

## 2012-01-03 MED ORDER — METRONIDAZOLE 500 MG PO TABS: 500.0000 mg | ORAL_TABLET | Freq: Two times a day (BID) | ORAL | Status: DC

## 2012-01-03 NOTE — Telephone Encounter (Signed)
CT scan and office note from Dr. Marina Goodell faxed to Dr. Johna Sheriff per Dr. Lamar Sprinkles request.

## 2012-01-03 NOTE — Telephone Encounter (Signed)
Please call [name] and let him know that I have reviewed the CT. Let him know that there was NO significant improvement in the abscess around his colon. I would like him to see a general surgeon ASAP. See if you can get him in with [name] "persistent pericolonic abscess, likely diverticular". In the interim, start him on Augmentin 850 mg twice a day x2 weeks and metronidazole 500 mg twice a day x2 weeks. He may need a relook at his colon (colonoscopy), however I would like him to see the surgeon first. Thanks

## 2012-01-03 NOTE — Telephone Encounter (Signed)
I really would like him to be seen sooner. I would be happy to speak with Dr. Johna Sheriff, if necessary. Otherwise, we can have him see one of his colleagues. Let me know

## 2012-01-03 NOTE — Telephone Encounter (Signed)
I prefer he take both. Sorry. Thanks

## 2012-01-03 NOTE — Telephone Encounter (Signed)
Spoke with pt and he is aware. Pt wants to know if he really has to take the Flagyl because he wants to drink alcohol. Wanted to know if there is something else or wouldn't just taking the Augmentin be ok? Please advise.

## 2012-01-03 NOTE — Telephone Encounter (Signed)
Pt scheduled to see Dr. Johna Sheriff 01/24/12 pt to arrive at 8:45am for a 9am appt. Pt placed on cancellation list for sooner appt. Pt aware of appt date and time.

## 2012-01-03 NOTE — Telephone Encounter (Signed)
See previous telephone note. 

## 2012-01-03 NOTE — Telephone Encounter (Signed)
Called [facility] back and message has been sent to [facility], Dr. Jamse Mead nurse, to see if Dr. Johna Sheriff can see the pt sooner. Waiting to hear back from [facility].

## 2012-01-03 NOTE — Telephone Encounter (Signed)
Linda at Dr [facility] office called stating Dr Marina Goodell wants pt seen sooner than 12-13. Bonita Quin advised a msg will be sent to Dr [employer] assistant to review schedule and call her back at [phone] with sooner appt.

## 2012-01-03 NOTE — Telephone Encounter (Signed)
Spoke with pt and he is aware. Prescriptions sent to the pharmacy. Referral sent to [facility].

## 2012-01-06 DIAGNOSIS — I10 Essential (primary) hypertension: Secondary | ICD-10-CM | POA: Diagnosis not present

## 2012-01-06 DIAGNOSIS — I4891 Unspecified atrial fibrillation: Secondary | ICD-10-CM | POA: Diagnosis not present

## 2012-01-06 DIAGNOSIS — R634 Abnormal weight loss: Secondary | ICD-10-CM | POA: Diagnosis not present

## 2012-01-06 DIAGNOSIS — R1900 Intra-abdominal and pelvic swelling, mass and lump, unspecified site: Secondary | ICD-10-CM | POA: Diagnosis not present

## 2012-01-06 NOTE — Telephone Encounter (Signed)
Spoke with [name] at [facility]. Pt has been scheduled to see Dr. Johna Sheriff 01/17/12. Pt to arrive at the office at 9:30am for a 10am appt. Left message for pt regarding appt date and time.  Pt called and confirmed appt.

## 2012-01-06 NOTE — Telephone Encounter (Signed)
Spoke with [facility] @ Dr. Lamar Sprinkles office, patient has been r/s to 01/17/12 @ 10:00 am w/Dr. Johna Sheriff.

## 2012-01-13 ENCOUNTER — Telehealth: Payer: Self-pay | Admitting: Internal Medicine

## 2012-01-13 NOTE — Telephone Encounter (Signed)
I don't. Gave first disk to our radiologist. Only had a paper copy from his last CT. He should be able to get disks from the imaging center that did the exams

## 2012-01-13 NOTE — Telephone Encounter (Signed)
Pt states he needs to take the disc from his CT scan to the surgeon when he goes. Pt wants to know if we still have the disc. Dr. Marina Goodell do you have this? Please advise.

## 2012-01-13 NOTE — Telephone Encounter (Signed)
Pt aware.

## 2012-01-17 ENCOUNTER — Ambulatory Visit (INDEPENDENT_AMBULATORY_CARE_PROVIDER_SITE_OTHER): Payer: Managed Care, Other (non HMO) | Admitting: General Surgery

## 2012-01-17 ENCOUNTER — Encounter (INDEPENDENT_AMBULATORY_CARE_PROVIDER_SITE_OTHER): Payer: Self-pay | Admitting: General Surgery

## 2012-01-17 VITALS — BP 130/86 | HR 97 | Temp 97.3°F | Ht 69.0 in | Wt 164.2 lb

## 2012-01-17 DIAGNOSIS — K630 Abscess of intestine: Secondary | ICD-10-CM | POA: Diagnosis not present

## 2012-01-17 DIAGNOSIS — K5720 Diverticulitis of large intestine with perforation and abscess without bleeding: Secondary | ICD-10-CM | POA: Insufficient documentation

## 2012-01-17 MED ORDER — AMOXICILLIN-POT CLAVULANATE 875-125 MG PO TABS: 1.0000 | ORAL_TABLET | Freq: Two times a day (BID) | ORAL | Status: AC

## 2012-01-17 NOTE — Progress Notes (Signed)
Subjective:   chills, probable diverticular abscess  Patient ID: [name], male   DOB: 12-04-1940, 71 y.o.   MRN: [id number]  HPI Patient is a very pleasant 71 year old male referred by Dr. Yancey Flemings for an apparent diverticular abscess. The patient has multiple medical problems as noted below. He was hospitalized with upper GI bleeding earlier this year. Around early September of this year he developed symptoms of lower abdominal discomfort which was somewhat vague in nature and not severe. He also developed some malaise and lack of appetite and began to lose weight. This was also associated with fairly significant chills and rigors associated with some confusion at that time they occurred. The patient subsequently had a CT scan of the abdomen and pelvis performed in late October. This was done chiefly to evaluate his weight loss rather than any abdominal symptoms which again were quite mild. The most significant finding was an apparent pericolonic abscess at the sigmoid colon with wall thickening and associated diverticula an approximately 5 cm fluid and air filled abscess. There were also significant changes of peripheral vascular disease. The patient at that time was treated with a two-week course of oral Augmentin and Flagyl. While on the antibiotics his symptoms significantly improved. However upon stopping the antibiotics he again had some episodes of chills. Repeat CT scan was performed on 1113 which showed minimal if any change in the abscess although there was less fluid and more thickening of the wall noted. The patient was again started on the same antibiotic regimen and he is currently about complete a two-week course of this. Since starting treatment his appetite has improved and he has actually gained 6 pounds. His bowel movements have been normal. At this point on antibiotics he is entirely asymptomatic with no chills in the last couple of weeks and no double pain. Bowel movements are  normal. His energy is improved. Past Medical History  Diagnosis Date  . Hyperglycemia   . CAD (coronary artery disease)   . Hyperlipidemia   . Chronic atrial fibrillation     Dr. Clifton James  . Anemia   . Gilbert's syndrome   . Hypertension   . Hemorrhoids   . Cataract     BILATERAL  . Renal insufficiency   . Myocardial infarction     history of. 1983  . AAA (abdominal aortic aneurysm)   . Duodenal ulcer   . History of GI bleed   . Heart murmur   . Heart attack    Past Surgical History  Procedure Date  . Cath/angioplasy     [id number], [facility] by Dr. Brett Canales [facility] of angioplasty)  . Carotid endarterectomy     bilaterally 2007  . Tonsillectomy   . No colonoscopy to date     ("I don't have a good excuse"). SOC reviewed  . Cataract extraction   . Pyloric stenosis   . Esophagogastroduodenoscopy 04/06/2011    Procedure: ESOPHAGOGASTRODUODENOSCOPY (EGD);  Surgeon: Theda Belfast, MD;  Location: [facility];  Service: Endoscopy;  Laterality: N/A;  . Eye surgery 2011    bilateral   Current Outpatient Prescriptions  Medication Sig Dispense Refill  . allopurinol (ZYLOPRIM) 100 MG tablet Take 100 mg by mouth as needed.      Marland Kitchen amoxicillin-clavulanate (AUGMENTIN) 875-125 MG per tablet Take 1 tablet by mouth 2 (two) times daily.  28 tablet  0  . colchicine 0.6 MG tablet Take 0.6 mg by mouth as needed. Arthritis      . ferrous sulfate 325 (  65 FE) MG tablet Take 325 mg by mouth daily with breakfast.      . metoprolol succinate (TOPROL-XL) 100 MG 24 hr tablet Take 100 mg by mouth daily.      . metroNIDAZOLE (FLAGYL) 500 MG tablet Take 1 tablet (500 mg total) by mouth 2 (two) times daily.  28 tablet  0  . omeprazole (PRILOSEC) 40 MG capsule Take 40 mg by mouth daily.      Marland Kitchen PRADAXA 150 MG CAPS       . rosuvastatin (CRESTOR) 20 MG tablet Take 1 tablet (20 mg total) by mouth as directed. Take 1 tab 2x/week on Mondays and Fridays  30 tablet  1   No Known Allergies History  Substance Use  Topics  . Smoking status: Former Smoker -- 3.0 packs/day    Types: Cigarettes    Quit date: 02/11/1981  . Smokeless tobacco: Never Used  . Alcohol Use: 9.6 oz/week    16 Shots of liquor per week     Comment: daily     Review of Systems  Constitutional: Positive for unexpected weight change.  Respiratory: Negative.   Cardiovascular: Negative.   Gastrointestinal: Negative.        Objective:   Physical Exam BP 130/86  Pulse 97  Temp 97.3 F (36.3 C) (Temporal)  Ht 5\' 9"  (1.753 m)  Wt 164 lb 3.2 oz (74.481 kg)  BMI 24.25 kg/m2  SpO2 98% General: Elderly slightly frail but not unwell appearing Caucasian male Skin: Warm and dry without rash or infection HEENT: No palpable masses or thyromegaly. Oropharynx clear Lungs: Clear equal breath sounds no increased work of breathing. Cardiovascular: Regular rhythm. No murmurs. 1-2+ lower extremity edema. Abdomen: Small healed upper abdominal incision. Soft and nontender to deep palpation. No guarding. No masses. No organomegaly. Extremities: 1-2+ edema without dorsal or deformity Neurologic: He is alert fully oriented. Affect normal. Gait normal.  Imaging: As described above in history of present illness and I personally reviewed the imaging.    Assessment:     Apparent pericolonic sigmoid diverticular abscess. Presentation is unusual with minimal discomfort and more malaise weight loss and chills. However this would appear to likely be the source of his recent illness. He is clearly markedly improved clinically on antibiotics despite persistence of the area on CT scan. He currently however feels well, is gaining weight, without abdominal pain or tenderness. He would be at increased operative risk due to his multiple medical issues. I would not recommend surgical intervention at this time. He may need a more prolonged course of antibiotics. I gave him an additional 2 weeks of Augmentin to complete at the end of his current prescription. I  will see him back in 4-5 weeks. At some point thereafter we will need to repeat a CT scan. He may eventually require surgery but hopefully this can be treated nonoperatively. He had a colonoscopy one year ago and this seems very unlikely to be a malignancy although he does not do surgery I think a more short-term followup colonoscopy may be indicated. He understands to call me if he experiences any further chills or worsening abdominal pain or other worrisome symptoms. All of his his wife's questions were answered and they are comfortable with this plan.    Plan:     As above

## 2012-01-17 NOTE — Patient Instructions (Signed)
We will continue Augmentin for 2 additional weeks and he may stop the Flagyl Call as needed for increased abdominal pain, fever and chills or other concerns. We will see you back in 5 weeks.

## 2012-01-24 ENCOUNTER — Ambulatory Visit (INDEPENDENT_AMBULATORY_CARE_PROVIDER_SITE_OTHER): Payer: Self-pay | Admitting: General Surgery

## 2012-02-13 ENCOUNTER — Telehealth: Payer: Self-pay | Admitting: Internal Medicine

## 2012-02-13 ENCOUNTER — Telehealth (INDEPENDENT_AMBULATORY_CARE_PROVIDER_SITE_OTHER): Payer: Self-pay | Admitting: General Surgery

## 2012-02-13 ENCOUNTER — Other Ambulatory Visit (INDEPENDENT_AMBULATORY_CARE_PROVIDER_SITE_OTHER): Payer: Self-pay | Admitting: General Surgery

## 2012-02-13 MED ORDER — AMOXICILLIN-POT CLAVULANATE 875-125 MG PO TABS: 1.0000 | ORAL_TABLET | Freq: Two times a day (BID) | ORAL | Status: DC

## 2012-02-13 NOTE — Telephone Encounter (Signed)
Called patient to make him aware Dr. Johna Sheriff has refilled his Augmentin 825-125 mg, take 1 tablet bid, #28 called into [employer] [facility] [employer].

## 2012-02-13 NOTE — Telephone Encounter (Signed)
Pts wife states that pt has been off of the Augmentin that Dr. Johna Sheriff prescribed for an additional 2 weeks. States that he is having pain and chills again like he was. Instructed them to call Dr. Jamse Mead office as the last OV note from Dr. Johna Sheriff states for him to call if he developed pain, chills or fever. Wife verbalized understanding and will call Dr. Jamse Mead office.

## 2012-02-13 NOTE — Telephone Encounter (Signed)
Pt's wife called to report he is currently off antibiotics (for last 4 days.)  He is experiencing periods of extreme chills (trembling so hard he cannot dial phone,) severe thirst, occasional vomiting and slight disorientation.  This all lasts for 1-2 hours and is followed by him being very hot.  His next appt with Dr. Johna Sheriff is on 02/28/12.  Wife is concerned and asks if he needs to be on another round of antibiotics or needs to be seen sooner?  Please advise.

## 2012-02-28 ENCOUNTER — Ambulatory Visit (INDEPENDENT_AMBULATORY_CARE_PROVIDER_SITE_OTHER): Payer: Managed Care, Other (non HMO) | Admitting: General Surgery

## 2012-02-28 VITALS — BP 128/68 | HR 84 | Temp 98.6°F | Resp 18 | Ht 69.0 in | Wt 168.6 lb

## 2012-02-28 DIAGNOSIS — K5720 Diverticulitis of large intestine with perforation and abscess without bleeding: Secondary | ICD-10-CM

## 2012-02-28 DIAGNOSIS — K630 Abscess of intestine: Secondary | ICD-10-CM | POA: Diagnosis not present

## 2012-02-28 NOTE — Progress Notes (Signed)
History: Patient returns for followup of his diverticular abscess. He completed a course of antibiotics around new years for a couple of days later had an episode of "trembling" described as chills by his wife. He has had some mild lower abdominal discomfort only with bowel movements. We called in another 2 week course of Cipro at that time and he is finished with this tomorrow. He generally is feeling okay but still has some mild lower abdominal discomfort. Bowel movements are otherwise okay. No fever chills on antibiotics.  Exam: BP 128/68  Pulse 84  Temp 98.6 F (37 C) (Oral)  Resp 18  Ht 5\' 9"  (1.753 m)  Wt 168 lb 9.6 oz (76.476 kg)  BMI 24.90 kg/m2 General: Does not appear ill Abdomen: Flat soft and nontender. No masses or guarding.  Assessment and plan: Diverticular abscess. He has had an atypical presentation. Clinically it is hard to know if he is resolving this or not. We will repeat a CT scan next week I will call him with the results. He will stop antibiotics at the current prescription to call me if he feels it is worse in anyway. We have a default appointment in 3 weeks.

## 2012-02-28 NOTE — Patient Instructions (Addendum)
Stop antibiotics after your current prescription. Call as needed for any worsening symptoms or concerns. I'll call you with the results of your CT scan next week.

## 2012-03-02 ENCOUNTER — Other Ambulatory Visit (INDEPENDENT_AMBULATORY_CARE_PROVIDER_SITE_OTHER): Payer: Self-pay

## 2012-03-02 ENCOUNTER — Telehealth (INDEPENDENT_AMBULATORY_CARE_PROVIDER_SITE_OTHER): Payer: Self-pay

## 2012-03-02 DIAGNOSIS — Z8719 Personal history of other diseases of the digestive system: Secondary | ICD-10-CM

## 2012-03-04 ENCOUNTER — Other Ambulatory Visit: Payer: Managed Care, Other (non HMO)

## 2012-03-04 ENCOUNTER — Telehealth (INDEPENDENT_AMBULATORY_CARE_PROVIDER_SITE_OTHER): Payer: Self-pay

## 2012-03-04 DIAGNOSIS — K651 Peritoneal abscess: Secondary | ICD-10-CM | POA: Diagnosis not present

## 2012-03-04 DIAGNOSIS — K3580 Unspecified acute appendicitis: Secondary | ICD-10-CM | POA: Diagnosis not present

## 2012-03-04 NOTE — Telephone Encounter (Signed)
Faxed signed order for CT abd /pelvis to [facility] [phone].  Confirmation of fax transmission rec'd and attached to signed order.

## 2012-03-05 ENCOUNTER — Ambulatory Visit (INDEPENDENT_AMBULATORY_CARE_PROVIDER_SITE_OTHER): Payer: Managed Care, Other (non HMO) | Admitting: General Surgery

## 2012-03-05 ENCOUNTER — Encounter (INDEPENDENT_AMBULATORY_CARE_PROVIDER_SITE_OTHER): Payer: Self-pay | Admitting: General Surgery

## 2012-03-05 VITALS — BP 132/72 | HR 93 | Temp 97.0°F | Resp 18 | Ht 69.0 in | Wt 168.0 lb

## 2012-03-05 DIAGNOSIS — K5720 Diverticulitis of large intestine with perforation and abscess without bleeding: Secondary | ICD-10-CM

## 2012-03-05 DIAGNOSIS — K630 Abscess of intestine: Secondary | ICD-10-CM

## 2012-03-05 MED ORDER — AMOXICILLIN-POT CLAVULANATE 875-125 MG PO TABS: 1.0000 | ORAL_TABLET | Freq: Two times a day (BID) | ORAL | Status: DC

## 2012-03-05 NOTE — Progress Notes (Signed)
Chief complaint: Followup diverticular abscess  History: The patient returns to the office in followup after repeat CT scan to followup his pericolonic diverticular abscess. This unfortunately shows no significant change with a thickwalled 5 cm cavity and surrounding inflammatory change. In addition the appendix is noted to be dilated and inflamed which was adjacent to the abscess. He has some persistent abdominal soreness but no severe pain. He just completed another course of antibiotics and did not have any further fever or chills on antibiotics. He does have some pain with bowel movements.  Exam: BP 132/72  Pulse 93  Temp 97 F (36.1 C)  Resp 18  Ht 5\' 9"  (1.753 m)  Wt 168 lb (76.204 kg)  BMI 24.81 kg/m2 General: No acute distress Skin: Warm and dry Lungs: Clear equal breath sounds without increased work of breathing Cardiac: Irregular rhythm. No edema Abdomen: Mild lower abdominal tenderness with minimal guarding. No palpable masses. Extremities: No edema  Assessment and plan: Persistent diverticular abscess sigmoid colon with no improvement after multiple courses of outpatient antibiotics. Interventional radiology per history as previously ruled out percutaneous drainage but I will doublecheck this. Assuming this is not possible I think we will need to proceed with sigmoid colectomy. We discussed options which really would include only continued antibiotic treatment and I think this is very unlikely to be successful and he is in agreement. We discussed the nature of the surgery including the possibility of colostomy as well as risks of anesthetic complications, bleeding, infection, anastomotic leak. Get cardiac clearance preoperatively and recommendations regarding anticoagulation. All of his and his wife's questions were answered.

## 2012-03-05 NOTE — Patient Instructions (Signed)
We will call you in the next several days regarding scheduling surgery

## 2012-03-05 NOTE — Telephone Encounter (Signed)
Late Entry (03/05/12) Called patient with follow up appointment date & time for 03/05/12 w/Dr. Johna Sheriff to discuss CT results.

## 2012-03-06 ENCOUNTER — Encounter (INDEPENDENT_AMBULATORY_CARE_PROVIDER_SITE_OTHER): Payer: Self-pay

## 2012-03-06 DIAGNOSIS — I4891 Unspecified atrial fibrillation: Secondary | ICD-10-CM | POA: Diagnosis not present

## 2012-03-06 DIAGNOSIS — M109 Gout, unspecified: Secondary | ICD-10-CM | POA: Diagnosis not present

## 2012-03-06 DIAGNOSIS — J069 Acute upper respiratory infection, unspecified: Secondary | ICD-10-CM | POA: Diagnosis not present

## 2012-03-06 DIAGNOSIS — K5732 Diverticulitis of large intestine without perforation or abscess without bleeding: Secondary | ICD-10-CM | POA: Diagnosis not present

## 2012-03-12 ENCOUNTER — Encounter: Payer: Self-pay | Admitting: Cardiovascular Disease

## 2012-03-12 ENCOUNTER — Telehealth (INDEPENDENT_AMBULATORY_CARE_PROVIDER_SITE_OTHER): Payer: Self-pay

## 2012-03-12 NOTE — Telephone Encounter (Signed)
Pts wife calling to get update on cardiac clearance. I advised her the letter is in epic where it was sent to cardiologist but not notation of response from him. Pt request call back from Dr [employer] assistant. Pt advised I will send msg to [place]. Wife can be reached at [phone]. She will also call cardiologists office.

## 2012-03-12 NOTE — Telephone Encounter (Signed)
Left message for patient to call our office regarding Cardiac Clearance.  Clearance has been rec'd and will have Dr. Johna Sheriff review on 03/13/12.  Will contact patient at that time to discuss in further detail.

## 2012-03-13 ENCOUNTER — Telehealth (INDEPENDENT_AMBULATORY_CARE_PROVIDER_SITE_OTHER): Payer: Self-pay

## 2012-03-13 NOTE — Telephone Encounter (Signed)
Bowel prep mailed to patient

## 2012-03-13 NOTE — Telephone Encounter (Signed)
Left message for patient to call our office re: Patient need's to hold his Pradaxa 7 days prior to surgery (per Dr. Clifton James)

## 2012-03-24 ENCOUNTER — Telehealth (INDEPENDENT_AMBULATORY_CARE_PROVIDER_SITE_OTHER): Payer: Self-pay | Admitting: General Surgery

## 2012-03-24 NOTE — Telephone Encounter (Signed)
Patient's wife called and was wondering if they could get an RX for a hospital bed for when patient comes home after the surgery that is scheduled on 04/07/12. Please advise. [phone].

## 2012-03-25 ENCOUNTER — Telehealth: Payer: Self-pay | Admitting: Internal Medicine

## 2012-03-25 NOTE — Progress Notes (Signed)
03-25-12 Need MD order entry in Epic for Presurgical testing visit 04-03-12. W. Kennon Portela

## 2012-03-25 NOTE — Telephone Encounter (Signed)
Pt requests that Dr. Marina Goodell call him directly to discuss his upcoming surgery. States he feels comfortable talking to Dr. Marina Goodell. Let pt know that Dr. Marina Goodell is covering the hospital this week and it may be next week before he returns his call. Pt verbalized understanding.

## 2012-03-25 NOTE — Telephone Encounter (Signed)
I spoke with patient. He was appreciative. Plans for surgery noted.

## 2012-03-26 ENCOUNTER — Other Ambulatory Visit (INDEPENDENT_AMBULATORY_CARE_PROVIDER_SITE_OTHER): Payer: Self-pay | Admitting: General Surgery

## 2012-03-28 ENCOUNTER — Other Ambulatory Visit: Payer: Self-pay

## 2012-03-30 ENCOUNTER — Telehealth (INDEPENDENT_AMBULATORY_CARE_PROVIDER_SITE_OTHER): Payer: Self-pay

## 2012-03-30 ENCOUNTER — Encounter (HOSPITAL_COMMUNITY): Payer: Self-pay | Admitting: Pharmacy Technician

## 2012-03-30 NOTE — Telephone Encounter (Signed)
Message copied by Maryan Puls on Mon Mar 30, 2012  1:29 PM ------      Message from: Glenna Fellows T      Created: Thu Mar 26, 2012  9:39 AM       Please be sure that the patient has mechanical and antibiotic bowel prep for his planned colectomy. I can't remember if we gave this to him the last time he was in the office. ------

## 2012-03-30 NOTE — Telephone Encounter (Signed)
The pt's wife called back and she said he does have the bowel prep.  She asked to make sure it is to be done the day before and I told her it is.  His surgery is 2/25.

## 2012-03-30 NOTE — Telephone Encounter (Signed)
Called and left message for patient to call our office re: please confirm patient has 1 day pre-op bowel prep

## 2012-04-02 NOTE — Progress Notes (Signed)
Chest 10/13 chart,  LOv Dr Sanjuana Kava 3/13 EPIC- per phone note dated 03/13/12- pt to hold Pradaxa 7 days pre op per Dr Sanjuana Kava

## 2012-04-02 NOTE — Patient Instructions (Addendum)
[name].  04/02/2012   Your procedure is scheduled on:  04/07/12  TUESDAY  Report to [facility] at    1045   AM.  Call this number if you have problems the morning of surgery: [phone]       Remember:   Do not eat food after Sunday night for bowel prep as directed by office on Monday.  Drink clear liquids Monday- increase fluids and drink until bedtime Monday night.  MAY HAVE CLEAR LIQUIDS Tuesday MORNING UNTIL [id number] Tuesday MORNING-- THEN NOTHING BY MOUTH   Take these medicines the morning of surgery with A SIP OF WATER: METOPROLOL, PROLISEC   .  Contacts, dentures or partial plates can not be worn to surgery  Leave suitcase in the car. After surgery it may be brought to your room.  For patients admitted to the hospital, checkout time is 11:00 AM day of  discharge.             SPECIAL INSTRUCTIONS- SEE [facility] PREPARING FOR SURGERY INSTRUCTION SHEET-     DO NOT WEAR JEWELRY, LOTIONS, POWDERS, OR PERFUMES.  WOMEN-- DO NOT SHAVE LEGS OR UNDERARMS FOR 12 HOURS BEFORE SHOWERS. MEN MAY SHAVE FACE.  Patients discharged the day of surgery will not be allowed to drive home. IF going home the day of surgery, you must have a driver and someone to stay with you for the first 24 hours  Name and phone number of your driver:   admission          [name]                                                           Please read over the following fact sheets that you were given: MRSA Information, Incentive Spirometry Sheet, Blood Transfusion Sheet  Information                                                                                   [name]  [name]  [id number]                 FAILURE TO FOLLOW THESE INSTRUCTIONS MAY RESULT IN  CANCELLATION   OF YOUR SURGERY                                                  Patient Signature _____________________________

## 2012-04-03 ENCOUNTER — Encounter (HOSPITAL_COMMUNITY)
Admission: RE | Admit: 2012-04-03 | Discharge: 2012-04-03 | Disposition: A | Discharge status: Home / Self Care | Payer: Managed Care, Other (non HMO) | Source: Ambulatory Visit | Attending: General Surgery | Admitting: General Surgery

## 2012-04-03 ENCOUNTER — Telehealth (INDEPENDENT_AMBULATORY_CARE_PROVIDER_SITE_OTHER): Payer: Self-pay | Admitting: General Surgery

## 2012-04-03 ENCOUNTER — Encounter (HOSPITAL_COMMUNITY): Payer: Self-pay

## 2012-04-03 DIAGNOSIS — Z792 Long term (current) use of antibiotics: Secondary | ICD-10-CM | POA: Diagnosis not present

## 2012-04-03 DIAGNOSIS — K630 Abscess of intestine: Secondary | ICD-10-CM | POA: Diagnosis not present

## 2012-04-03 DIAGNOSIS — Z8719 Personal history of other diseases of the digestive system: Secondary | ICD-10-CM | POA: Diagnosis not present

## 2012-04-03 DIAGNOSIS — E785 Hyperlipidemia, unspecified: Secondary | ICD-10-CM | POA: Diagnosis present

## 2012-04-03 DIAGNOSIS — Z87891 Personal history of nicotine dependence: Secondary | ICD-10-CM | POA: Diagnosis not present

## 2012-04-03 DIAGNOSIS — M109 Gout, unspecified: Secondary | ICD-10-CM | POA: Diagnosis present

## 2012-04-03 DIAGNOSIS — K5289 Other specified noninfective gastroenteritis and colitis: Secondary | ICD-10-CM | POA: Diagnosis not present

## 2012-04-03 DIAGNOSIS — I2510 Atherosclerotic heart disease of native coronary artery without angina pectoris: Secondary | ICD-10-CM | POA: Diagnosis not present

## 2012-04-03 DIAGNOSIS — I252 Old myocardial infarction: Secondary | ICD-10-CM | POA: Diagnosis not present

## 2012-04-03 DIAGNOSIS — K5732 Diverticulitis of large intestine without perforation or abscess without bleeding: Secondary | ICD-10-CM | POA: Diagnosis not present

## 2012-04-03 DIAGNOSIS — N289 Disorder of kidney and ureter, unspecified: Secondary | ICD-10-CM | POA: Diagnosis not present

## 2012-04-03 DIAGNOSIS — I4891 Unspecified atrial fibrillation: Secondary | ICD-10-CM | POA: Diagnosis present

## 2012-04-03 DIAGNOSIS — K5730 Diverticulosis of large intestine without perforation or abscess without bleeding: Secondary | ICD-10-CM | POA: Diagnosis not present

## 2012-04-03 DIAGNOSIS — Z9089 Acquired absence of other organs: Secondary | ICD-10-CM | POA: Diagnosis not present

## 2012-04-03 DIAGNOSIS — Z8711 Personal history of peptic ulcer disease: Secondary | ICD-10-CM | POA: Diagnosis not present

## 2012-04-03 DIAGNOSIS — I10 Essential (primary) hypertension: Secondary | ICD-10-CM | POA: Diagnosis not present

## 2012-04-03 DIAGNOSIS — K389 Disease of appendix, unspecified: Secondary | ICD-10-CM | POA: Diagnosis not present

## 2012-04-03 DIAGNOSIS — Z9861 Coronary angioplasty status: Secondary | ICD-10-CM | POA: Diagnosis not present

## 2012-04-03 DIAGNOSIS — Z79899 Other long term (current) drug therapy: Secondary | ICD-10-CM | POA: Diagnosis not present

## 2012-04-03 HISTORY — DX: Personal history of other medical treatment: Z92.89

## 2012-04-03 LAB — CBC
HCT: 35.4 % — ABNORMAL LOW (ref 39.0–52.0)
Hemoglobin: 11.3 g/dL — ABNORMAL LOW (ref 13.0–17.0)
MCH: 31.1 pg (ref 26.0–34.0)
MCHC: 31.9 g/dL (ref 30.0–36.0)
MCV: 97.5 fL (ref 78.0–100.0)
RBC: 3.63 MIL/uL — ABNORMAL LOW (ref 4.22–5.81)

## 2012-04-03 LAB — BASIC METABOLIC PANEL
BUN: 9 mg/dL (ref 6–23)
CO2: 26 mEq/L (ref 19–32)
Calcium: 8.7 mg/dL (ref 8.4–10.5)
Creatinine, Ser: 0.88 mg/dL (ref 0.50–1.35)
GFR calc non Af Amer: 84 mL/min — ABNORMAL LOW (ref >90)
Glucose, Bld: 98 mg/dL (ref 70–99)
Sodium: 135 mEq/L (ref 135–145)

## 2012-04-03 LAB — APTT: aPTT: 37 seconds (ref 24–37)

## 2012-04-03 LAB — PROTIME-INR: INR: 1.09 (ref 0.00–1.49)

## 2012-04-03 NOTE — Progress Notes (Signed)
Patient states stopped his Pradaxa 03/20/12 in spite of instructions that had been given to him earlier to stop 7 days pre op

## 2012-04-03 NOTE — Telephone Encounter (Signed)
Lawson Fiscal called to ask if pt needed marking for his surgery next week by Dr. Johna Sheriff Permit indicated possible colostomy. Pt was in pre-op area and had to leave soon, so Lawson Fiscal applied a dot with marking pen and cover/ luq in case a colostomy was needed. Pt was fine with this being done so that he did not have to return. Gy/ No need to call Lawson Fiscal at this point/gy

## 2012-04-03 NOTE — Progress Notes (Signed)
Per patient- "DIFFICULT INTUBATION THEY SAY with my carotid surgery at [facility]".. Records obtained- anesthesia sheet and Dr Rodell Perna note- 2005.  Reviewed by Dr Rica Mast and placed on chart.  OR scheduling VICKIE notifed.  Notified wife of POS MRSA and to begin MUPIROCIN-  Faxed pos PCR to Dr Johna Sheriff via [facility]

## 2012-04-03 NOTE — Consult Note (Signed)
WOC ostomy consult  Patient marked for possible colostomy surgery next week.  Noted that this is not expected, but his procedure could result in stoma creation, so a mark is requested.  Dr. Jamse Mead nurse, Kendell Bane, contacted regarding the marking and Dr. Johna Sheriff was not able to be reached this morning because he is in surgery.   Patient's abdomen assessed in teh sitting and standing positions, site selected is 7.5cm to the left and 1cm above the umbilicus in a flat plane.  No educational material is provided today since this is an unexpected outcome; patient and wife are aware that WOC Nursing services are available post operatively. Thanks, Ladona Mow, MSN, RN, [facility], CWOCN [phone])

## 2012-04-07 ENCOUNTER — Inpatient Hospital Stay (HOSPITAL_COMMUNITY): Payer: Managed Care, Other (non HMO) | Admitting: Anesthesiology

## 2012-04-07 ENCOUNTER — Inpatient Hospital Stay (HOSPITAL_COMMUNITY)
Admission: RE | Admit: 2012-04-07 | Discharge: 2012-04-11 | DRG: 330 | Disposition: A | Discharge status: Home Health | Payer: Managed Care, Other (non HMO) | Source: Ambulatory Visit | Attending: General Surgery | Admitting: General Surgery

## 2012-04-07 ENCOUNTER — Encounter (HOSPITAL_COMMUNITY): Payer: Self-pay | Admitting: Anesthesiology

## 2012-04-07 ENCOUNTER — Encounter (HOSPITAL_COMMUNITY)
Admission: RE | Disposition: A | Discharge status: Home Health | Payer: Self-pay | Source: Ambulatory Visit | Attending: General Surgery

## 2012-04-07 ENCOUNTER — Encounter (HOSPITAL_COMMUNITY): Payer: Self-pay | Admitting: *Deleted

## 2012-04-07 DIAGNOSIS — N289 Disorder of kidney and ureter, unspecified: Secondary | ICD-10-CM | POA: Diagnosis present

## 2012-04-07 DIAGNOSIS — Z792 Long term (current) use of antibiotics: Secondary | ICD-10-CM

## 2012-04-07 DIAGNOSIS — K5720 Diverticulitis of large intestine with perforation and abscess without bleeding: Secondary | ICD-10-CM

## 2012-04-07 DIAGNOSIS — K5730 Diverticulosis of large intestine without perforation or abscess without bleeding: Secondary | ICD-10-CM | POA: Diagnosis not present

## 2012-04-07 DIAGNOSIS — N259 Disorder resulting from impaired renal tubular function, unspecified: Secondary | ICD-10-CM

## 2012-04-07 DIAGNOSIS — J309 Allergic rhinitis, unspecified: Secondary | ICD-10-CM

## 2012-04-07 DIAGNOSIS — I10 Essential (primary) hypertension: Secondary | ICD-10-CM | POA: Diagnosis present

## 2012-04-07 DIAGNOSIS — K5732 Diverticulitis of large intestine without perforation or abscess without bleeding: Secondary | ICD-10-CM

## 2012-04-07 DIAGNOSIS — Z9861 Coronary angioplasty status: Secondary | ICD-10-CM

## 2012-04-07 DIAGNOSIS — D62 Acute posthemorrhagic anemia: Secondary | ICD-10-CM

## 2012-04-07 DIAGNOSIS — I2510 Atherosclerotic heart disease of native coronary artery without angina pectoris: Secondary | ICD-10-CM | POA: Diagnosis present

## 2012-04-07 DIAGNOSIS — IMO0001 Reserved for inherently not codable concepts without codable children: Secondary | ICD-10-CM

## 2012-04-07 DIAGNOSIS — Z79899 Other long term (current) drug therapy: Secondary | ICD-10-CM

## 2012-04-07 DIAGNOSIS — Z9089 Acquired absence of other organs: Secondary | ICD-10-CM

## 2012-04-07 DIAGNOSIS — K630 Abscess of intestine: Secondary | ICD-10-CM | POA: Diagnosis present

## 2012-04-07 DIAGNOSIS — I252 Old myocardial infarction: Secondary | ICD-10-CM

## 2012-04-07 DIAGNOSIS — M109 Gout, unspecified: Secondary | ICD-10-CM | POA: Diagnosis present

## 2012-04-07 DIAGNOSIS — K389 Disease of appendix, unspecified: Secondary | ICD-10-CM | POA: Diagnosis not present

## 2012-04-07 DIAGNOSIS — E785 Hyperlipidemia, unspecified: Secondary | ICD-10-CM | POA: Diagnosis present

## 2012-04-07 DIAGNOSIS — E559 Vitamin D deficiency, unspecified: Secondary | ICD-10-CM

## 2012-04-07 DIAGNOSIS — D692 Other nonthrombocytopenic purpura: Secondary | ICD-10-CM

## 2012-04-07 DIAGNOSIS — Z87891 Personal history of nicotine dependence: Secondary | ICD-10-CM

## 2012-04-07 DIAGNOSIS — H9190 Unspecified hearing loss, unspecified ear: Secondary | ICD-10-CM

## 2012-04-07 DIAGNOSIS — K5289 Other specified noninfective gastroenteritis and colitis: Secondary | ICD-10-CM | POA: Diagnosis not present

## 2012-04-07 DIAGNOSIS — R7309 Other abnormal glucose: Secondary | ICD-10-CM

## 2012-04-07 DIAGNOSIS — I4891 Unspecified atrial fibrillation: Secondary | ICD-10-CM | POA: Diagnosis present

## 2012-04-07 DIAGNOSIS — Z8719 Personal history of other diseases of the digestive system: Secondary | ICD-10-CM

## 2012-04-07 DIAGNOSIS — K2961 Other gastritis with bleeding: Secondary | ICD-10-CM

## 2012-04-07 DIAGNOSIS — D649 Anemia, unspecified: Secondary | ICD-10-CM

## 2012-04-07 DIAGNOSIS — Z8711 Personal history of peptic ulcer disease: Secondary | ICD-10-CM

## 2012-04-07 HISTORY — PX: PARTIAL COLECTOMY: SHX5273

## 2012-04-07 HISTORY — PX: COLOSTOMY: SHX63

## 2012-04-07 LAB — TYPE AND SCREEN: Antibody Screen: NEGATIVE

## 2012-04-07 SURGERY — CREATION, COLOSTOMY
Anesthesia: General | Site: Abdomen | Wound class: Dirty or Infected

## 2012-04-07 MED ORDER — COLCHICINE 0.6 MG PO TABS
0.6000 mg | ORAL_TABLET | Freq: Every day | ORAL | Status: DC | PRN
  Filled 2012-04-07: qty 1

## 2012-04-07 MED ORDER — DEXTROSE 5 % IV SOLN
2.0000 g | INTRAVENOUS | Status: AC
  Administered 2012-04-07: 2 g via INTRAVENOUS

## 2012-04-07 MED ORDER — ALLOPURINOL 100 MG PO TABS
100.0000 mg | ORAL_TABLET | Freq: Every morning | ORAL | Status: DC
  Administered 2012-04-08 – 2012-04-11 (×4): 100 mg via ORAL
  Filled 2012-04-07 (×4): qty 1

## 2012-04-07 MED ORDER — ONDANSETRON HCL 4 MG/2ML IJ SOLN
INTRAMUSCULAR | Status: DC | PRN
  Administered 2012-04-07: 4 mg via INTRAVENOUS

## 2012-04-07 MED ORDER — PROPOFOL 10 MG/ML IV BOLUS
INTRAVENOUS | Status: DC | PRN
  Administered 2012-04-07: 110 mg via INTRAVENOUS

## 2012-04-07 MED ORDER — LACTATED RINGERS IV SOLN
INTRAVENOUS | Status: DC
  Administered 2012-04-07: 14:00:00 via INTRAVENOUS
  Administered 2012-04-07: 1000 mL via INTRAVENOUS

## 2012-04-07 MED ORDER — ROCURONIUM BROMIDE 100 MG/10ML IV SOLN
INTRAVENOUS | Status: DC | PRN
  Administered 2012-04-07: 40 mg via INTRAVENOUS
  Administered 2012-04-07 (×2): 10 mg via INTRAVENOUS

## 2012-04-07 MED ORDER — PHENYLEPHRINE HCL 10 MG/ML IJ SOLN
10.0000 mg | INTRAVENOUS | Status: DC | PRN
  Administered 2012-04-07: 40 ug/min via INTRAVENOUS

## 2012-04-07 MED ORDER — PROMETHAZINE HCL 25 MG/ML IJ SOLN: 6.2500 mg | INTRAMUSCULAR | Status: DC | PRN

## 2012-04-07 MED ORDER — POTASSIUM CHLORIDE IN NACL 20-0.9 MEQ/L-% IV SOLN
INTRAVENOUS | Status: DC
  Administered 2012-04-07 – 2012-04-11 (×6): via INTRAVENOUS
  Filled 2012-04-07 (×8): qty 1000

## 2012-04-07 MED ORDER — GLYCOPYRROLATE 0.2 MG/ML IJ SOLN
INTRAMUSCULAR | Status: DC | PRN
  Administered 2012-04-07: .7 mg via INTRAVENOUS

## 2012-04-07 MED ORDER — ONDANSETRON HCL 4 MG/2ML IJ SOLN: 4.0000 mg | Freq: Four times a day (QID) | INTRAMUSCULAR | Status: DC | PRN

## 2012-04-07 MED ORDER — OXYCODONE-ACETAMINOPHEN 5-325 MG PO TABS
1.0000 | ORAL_TABLET | ORAL | Status: DC | PRN
  Administered 2012-04-09 (×2): 1 via ORAL
  Administered 2012-04-10 – 2012-04-11 (×2): 2 via ORAL
  Filled 2012-04-07 (×2): qty 1
  Filled 2012-04-07 (×2): qty 2

## 2012-04-07 MED ORDER — ACETAMINOPHEN 10 MG/ML IV SOLN
INTRAVENOUS | Status: DC | PRN
  Administered 2012-04-07: 1000 mg via INTRAVENOUS

## 2012-04-07 MED ORDER — MORPHINE SULFATE 2 MG/ML IJ SOLN
2.0000 mg | INTRAMUSCULAR | Status: DC | PRN
  Administered 2012-04-07 (×3): 2 mg via INTRAVENOUS
  Administered 2012-04-08: 4 mg via INTRAVENOUS
  Administered 2012-04-08 (×3): 2 mg via INTRAVENOUS
  Administered 2012-04-09: 4 mg via INTRAVENOUS
  Filled 2012-04-07: qty 1
  Filled 2012-04-07: qty 2
  Filled 2012-04-07 (×6): qty 1
  Filled 2012-04-07: qty 2

## 2012-04-07 MED ORDER — PIPERACILLIN-TAZOBACTAM 3.375 G IVPB 30 MIN
3.3750 g | Freq: Three times a day (TID) | INTRAVENOUS | Status: DC
  Filled 2012-04-07: qty 50

## 2012-04-07 MED ORDER — ALVIMOPAN 12 MG PO CAPS
12.0000 mg | ORAL_CAPSULE | Freq: Once | ORAL | Status: AC
  Administered 2012-04-07: 12 mg via ORAL
  Filled 2012-04-07: qty 1

## 2012-04-07 MED ORDER — HEPARIN SODIUM (PORCINE) 5000 UNIT/ML IJ SOLN
5000.0000 [IU] | Freq: Three times a day (TID) | INTRAMUSCULAR | Status: DC
  Administered 2012-04-07 – 2012-04-11 (×11): 5000 [IU] via SUBCUTANEOUS
  Filled 2012-04-07 (×14): qty 1

## 2012-04-07 MED ORDER — METOPROLOL SUCCINATE ER 100 MG PO TB24
100.0000 mg | ORAL_TABLET | Freq: Every day | ORAL | Status: DC
  Administered 2012-04-08 – 2012-04-11 (×4): 100 mg via ORAL
  Filled 2012-04-07 (×5): qty 1

## 2012-04-07 MED ORDER — HYDROMORPHONE HCL PF 1 MG/ML IJ SOLN: 0.2500 mg | INTRAMUSCULAR | Status: DC | PRN

## 2012-04-07 MED ORDER — FENTANYL CITRATE 0.05 MG/ML IJ SOLN
INTRAMUSCULAR | Status: DC | PRN
  Administered 2012-04-07 (×3): 50 ug via INTRAVENOUS
  Administered 2012-04-07: 100 ug via INTRAVENOUS

## 2012-04-07 MED ORDER — LIDOCAINE HCL (CARDIAC) 20 MG/ML IV SOLN
INTRAVENOUS | Status: DC | PRN
  Administered 2012-04-07: 50 mg via INTRAVENOUS

## 2012-04-07 MED ORDER — MEPERIDINE HCL 50 MG/ML IJ SOLN: 6.2500 mg | INTRAMUSCULAR | Status: DC | PRN

## 2012-04-07 MED ORDER — PIPERACILLIN-TAZOBACTAM 3.375 G IVPB
3.3750 g | Freq: Three times a day (TID) | INTRAVENOUS | Status: DC
  Administered 2012-04-07 – 2012-04-11 (×11): 3.375 g via INTRAVENOUS
  Filled 2012-04-07 (×13): qty 50

## 2012-04-07 MED ORDER — LACTATED RINGERS IV SOLN: INTRAVENOUS | Status: DC

## 2012-04-07 MED ORDER — PHENYLEPHRINE HCL 10 MG/ML IJ SOLN
INTRAMUSCULAR | Status: DC | PRN
  Administered 2012-04-07: 40 ug via INTRAVENOUS

## 2012-04-07 MED ORDER — ONDANSETRON HCL 4 MG PO TABS: 4.0000 mg | ORAL_TABLET | Freq: Four times a day (QID) | ORAL | Status: DC | PRN

## 2012-04-07 MED ORDER — PANTOPRAZOLE SODIUM 40 MG IV SOLR
40.0000 mg | INTRAVENOUS | Status: DC
  Administered 2012-04-07 – 2012-04-09 (×3): 40 mg via INTRAVENOUS
  Filled 2012-04-07 (×4): qty 40

## 2012-04-07 MED ORDER — NEOSTIGMINE METHYLSULFATE 1 MG/ML IJ SOLN
INTRAMUSCULAR | Status: DC | PRN
  Administered 2012-04-07: 4 mg via INTRAVENOUS

## 2012-04-07 SURGICAL SUPPLY — 66 items
APPLICATOR COTTON TIP 6IN STRL (MISCELLANEOUS) IMPLANT
BLADE EXTENDED COATED 6.5IN (ELECTRODE) ×2 IMPLANT
BLADE HEX COATED 2.75 (ELECTRODE) ×2 IMPLANT
BLADE SURG SZ10 CARB STEEL (BLADE) ×2 IMPLANT
CANISTER SUCTION 2500CC (MISCELLANEOUS) ×2 IMPLANT
CELLS DAT CNTRL 66122 CELL SVR (MISCELLANEOUS) ×1 IMPLANT
CLIP TI LARGE 6 (CLIP) IMPLANT
CLOTH BEACON ORANGE TIMEOUT ST (SAFETY) ×2 IMPLANT
COVER MAYO STAND STRL (DRAPES) ×2 IMPLANT
DRAIN CHANNEL 19F RND (DRAIN) ×2 IMPLANT
DRAPE LAPAROSCOPIC ABDOMINAL (DRAPES) ×2 IMPLANT
DRAPE LG THREE QUARTER DISP (DRAPES) ×2 IMPLANT
DRAPE UTILITY XL STRL (DRAPES) ×4 IMPLANT
DRAPE WARM FLUID 44X44 (DRAPE) ×2 IMPLANT
DRSG PAD ABDOMINAL 8X10 ST (GAUZE/BANDAGES/DRESSINGS) ×2 IMPLANT
ELECT REM PT RETURN 9FT ADLT (ELECTROSURGICAL) ×2
ELECTRODE REM PT RTRN 9FT ADLT (ELECTROSURGICAL) ×1 IMPLANT
ENSEAL DEVICE STD TIP 35CM (ENDOMECHANICALS) IMPLANT
EVACUATOR 1/8 PVC DRAIN (DRAIN) ×2 IMPLANT
GLOVE BIOGEL PI IND STRL 6.5 (GLOVE) ×1 IMPLANT
GLOVE BIOGEL PI IND STRL 7.0 (GLOVE) ×2 IMPLANT
GLOVE BIOGEL PI IND STRL 7.5 (GLOVE) ×2 IMPLANT
GLOVE BIOGEL PI INDICATOR 6.5 (GLOVE) ×1
GLOVE BIOGEL PI INDICATOR 7.0 (GLOVE) ×2
GLOVE BIOGEL PI INDICATOR 7.5 (GLOVE) ×2
GLOVE SS BIOGEL STRL SZ 7.5 (GLOVE) ×2 IMPLANT
GLOVE SUPERSENSE BIOGEL SZ 7.5 (GLOVE) ×2
GLOVE SURG SS PI 7.0 STRL IVOR (GLOVE) ×2 IMPLANT
GOWN STRL NON-REIN LRG LVL3 (GOWN DISPOSABLE) ×6 IMPLANT
GOWN STRL REIN XL XLG (GOWN DISPOSABLE) ×8 IMPLANT
HAND ACTIVATED (MISCELLANEOUS) IMPLANT
KIT BASIN OR (CUSTOM PROCEDURE TRAY) ×2 IMPLANT
LEGGING LITHOTOMY PAIR STRL (DRAPES) ×2 IMPLANT
LIGASURE IMPACT 36 18CM CVD LR (INSTRUMENTS) IMPLANT
NS IRRIG 1000ML POUR BTL (IV SOLUTION) ×4 IMPLANT
PACK GENERAL/GYN (CUSTOM PROCEDURE TRAY) ×2 IMPLANT
RTRCTR WOUND ALEXIS 18CM MED (MISCELLANEOUS) ×2
SEALER TISSUE G2 CVD JAW 35 (ENDOMECHANICALS) IMPLANT
SEALER TISSUE G2 CVD JAW 45CM (ENDOMECHANICALS)
SEALER TISSUE X1 CVD JAW (INSTRUMENTS) ×2 IMPLANT
SLEEVE SURGEON STRL (DRAPES) ×4 IMPLANT
SPONGE GAUZE 4X4 12PLY (GAUZE/BANDAGES/DRESSINGS) ×2 IMPLANT
STAPLER CUT CVD 40MM GREEN (STAPLE) ×2 IMPLANT
STAPLER CUT RELOAD GREEN (STAPLE) ×2 IMPLANT
STAPLER VISISTAT 35W (STAPLE) ×2 IMPLANT
SUCTION POOLE TIP (SUCTIONS) ×4 IMPLANT
SUT ETHILON 2 0 PS N (SUTURE) ×2 IMPLANT
SUT NOV 1 T60/GS (SUTURE) IMPLANT
SUT NOVA NAB DX-16 0-1 5-0 T12 (SUTURE) IMPLANT
SUT NOVA T20/GS 25 (SUTURE) IMPLANT
SUT PDS AB 1 CTX 36 (SUTURE) IMPLANT
SUT PDS AB 1 TP1 54 (SUTURE) ×4 IMPLANT
SUT PDS AB 1 TP1 96 (SUTURE) IMPLANT
SUT PROLENE 2 0 KS (SUTURE) IMPLANT
SUT SILK 2 0 (SUTURE) ×1
SUT SILK 2 0 SH CR/8 (SUTURE) ×2 IMPLANT
SUT SILK 2 0SH CR/8 30 (SUTURE) IMPLANT
SUT SILK 2-0 18XBRD TIE 12 (SUTURE) ×1 IMPLANT
SUT SILK 2-0 30XBRD TIE 12 (SUTURE) IMPLANT
SUT SILK 3 0 (SUTURE) ×1
SUT SILK 3 0 SH CR/8 (SUTURE) ×2 IMPLANT
SUT SILK 3-0 18XBRD TIE 12 (SUTURE) ×1 IMPLANT
TOWEL OR 17X26 10 PK STRL BLUE (TOWEL DISPOSABLE) ×4 IMPLANT
TRAY FOLEY CATH 14FRSI W/METER (CATHETERS) ×2 IMPLANT
WATER STERILE IRR 1500ML POUR (IV SOLUTION) IMPLANT
YANKAUER SUCT BULB TIP NO VENT (SUCTIONS) ×2 IMPLANT

## 2012-04-07 NOTE — Op Note (Signed)
Preoperative Diagnosis: DIVERTICULAR ABSCESS  Postoprative Diagnosis: DIVERTICULAR ABSCESS  Procedure: Procedure(s): COLOSTOMY PARTIAL COLECTOMY APPENDECTOMY   Surgeon: Glenna Fellows T   Assistants: Lodema Pilot  Anesthesia:  General endotracheal anesthesiaDiagnos  Indications:   The patient is a 72 year old male who several months ago presented with weight loss and intermittent chills. CT scan revealed a pelvic abscess and thickening of the sigmoid colon consistent with a diverticular abscess. Since that time he has been through multiple courses of antibiotics and feels temporarily better but then has recurrent chills off antibiotics. This is not in a location that is amenable to percutaneous drainage. Recent followup CT scan shows persistence of a 5 cm pericolonic pelvic diverticular abscess with associated thickening of the bowel and inflammatory changes in the pelvis. Due to  persistent symptoms and findings as above we have elected to proceed with colectomy. The procedure indications and risks have been discussed in detail elsewhere and we have discussed a high likelihood of having to do a colostomy as well as risks of anesthetic complications, bleeding, infection and others.  Procedure Detail:  The patient had undergone a mechanical bowel prep at home. He is brought to the operating room and placed in the supine position on the operating table and general endotracheal anesthesia induced. He was carefully positioned in the lithotomy on yellowfin stirrups. Foley catheter was placed. He received preoperative IV antibiotics. The abdomen was widely sterilely prepped and draped. A timeout was performed and correct procedure verified. A midline incision below the umbilicus was used and dissection carried down to the subcutaneous tissue and midline fascia. The peritoneum was entered under direct vision. A wound protector was placed. Exploration of the pelvis revealed an apparent inflammatory  mass of the distal sigmoid colon with dense adherence to the bladder and right lateral pelvic sidewall. The appendix was also down in the pelvis and adjacent to the inflammatory process. The more proximal colon small bowel appeared normal. The left colon and sigmoid were mobilized dividing lateral peritoneal attachments. I identified the ureter on the left side which was lateral to our dissection and it was protected throughout the remainder of the procedure. Again noted were very dense fibrotic adhesions to the bladder and to the pelvic sidewall. Using careful cautery dissection and blunt dissection the distal sigmoid was mobilized away from the bladder. A good-sized abscess cavity with frank pus was entered. This was cultured and evacuated. Using mostly blunt dissection but also cautery dissection I was able to free the inflamed area and abscess cavity from the bladder and pelvic sidewall. This was fairly distal in the sigmoid colon. We carefully examined the distal rectosigmoid and there was quite a bit of thickening and inflammatory change all the way down into the cul-de-sac and we did not feel that this was a safe situation to proceed with a primary anastomosis. We therefore elected to do a [name] colectomy. A mobile portion of the proximal sigmoid colon which was healthy was chosen and divided with the contour stapler. The mesentery of the more distal sigmoid was then divided with the super jaw. The inferior mesenteric pedicle was additionally clamped proximally and tied. The dissection was carried down just along the sacral promontory until we were distal to the main inflammatory process. This was in the upper rectum which was then cleaned of perirectal fat and the mesentery divided with the super jaw and then the upper rectum was divided with another firing of the contour green load stapler.  The pelvis was then thoroughly irrigated  and hemostasis assured.  The tip of the appendix was significantly  thickened and inflamed which appeared to be a secondary process but I proceeded with appendectomy. The mesial appendix was sequentially divided with the super jaunts of the appendix was freed down to its base. The base of the appendix was tied with a 2-0 Vicryl tie and the appendix was clamped above this and removed. The appendiceal stump was inverted with a Z suture of 2-0 silk. The patient had been marked for an ostomy in the left lower quadrant and a skin button the subcutaneous tissue was excised and the anterior fascia incised and the rectus muscle bluntly split and the peritoneum opened and dilated to 2 fingers. I mobilized the more proximal sigmoid and left colon dividing peritoneal attachments and then the end of the sigmoid was able to be brought out easily as an end ostomy under no tension. At this point all instruments and gloves and gowns were changed and the wound redraped. A 19 Blake closed suction drain was brought out through a stab wound in the right lower quadrant and left in the pelvis at the site of the chronic abscess cavity. The viscera returned to their anatomic position. The midline fascia and peritoneum were closed with running looped #1 PDS begun at either end of the incision and tied centrally. The subcutaneous tissue was irrigated and the wound was left open and packed with moist saline gauze. The ostomy was matured excising the staple line and using everting full-thickness sutures between the skin edge and the bowel wall. Dry sterilel dressings and ostomy device were applied. Sponge and needles counts were correct.  Findings: Diverticulitis with pelvic abscess and significant pelvic inflammation  Estimated Blood Loss:  less than 100 mL         Drains: 19 Blake drain in pelvis and the abscess cavity  Blood Given: none          Specimens: #1 sigmoid colon with perforation and abscess #2 appendix        Complications:  * No complications entered in OR log *          Disposition: PACU - hemodynamically stable.         Condition: stable

## 2012-04-07 NOTE — Progress Notes (Signed)
ANTIBIOTIC CONSULT NOTE - INITIAL  Pharmacy Consult for Zosyn Indication: diverticular abscess/ colectomy  No Known Allergies  Patient Measurements: Height: 5' 8.11" (173 cm) (Documented on 04/03/2012) Weight: 169 lb 1.5 oz (76.7 kg) (Documented 04/03/2012) IBW/kg (Calculated) : 68.65 Adjusted Body Weight:   Vital Signs: Temp: 97.8 F (36.6 C) (02/25 1614) BP: 199/97 mmHg (02/25 1614) Pulse Rate: 80 (02/25 1614) Intake/Output from previous day:   Intake/Output from this shift: Total I/O In: 1000 [I.V.:1000] Out: 190 [Drains:40; Blood:150]  Labs: No results found for this basename: WBC, HGB, PLT, LABCREA, CREATININE,  in the last 72 hours Estimated Creatinine Clearance: 73.7 ml/min (by C-G formula based on Cr of 0.88). No results found for this basename: VANCOTROUGH, VANCOPEAK, VANCORANDOM, GENTTROUGH, GENTPEAK, GENTRANDOM, TOBRATROUGH, TOBRAPEAK, TOBRARND, AMIKACINPEAK, AMIKACINTROU, AMIKACIN,  in the last 72 hours   Microbiology: No results found for this or any previous visit (from the past 720 hour(s)).  Medical History: Past Medical History  Diagnosis Date  . Hyperglycemia   . CAD (coronary artery disease)   . Hyperlipidemia   . Chronic atrial fibrillation     Dr. Clifton James  . Anemia   . Gilbert's syndrome   . Hypertension   . Hemorrhoids   . Cataract     BILATERAL  . Renal insufficiency   . Myocardial infarction     history of. 1983  . AAA (abdominal aortic aneurysm)   . Duodenal ulcer   . History of GI bleed   . Heart murmur   . Heart attack   . History of blood transfusion   . Difficult intubation 2005    Medications:  Scheduled:  . [START ON 04/08/2012] allopurinol  100 mg Oral q morning - 10a  . [COMPLETED] alvimopan  12 mg Oral Once  . [COMPLETED] cefOXitin  2 g Intravenous On Call to OR  . heparin subcutaneous  5,000 Units Subcutaneous Q8H  . [START ON 04/08/2012] metoprolol succinate  100 mg Oral QPC breakfast  . pantoprazole (PROTONIX) IV  40  mg Intravenous Q24H  . piperacillin-tazobactam  3.375 g Intravenous Q8H   Infusions:  . 0.9 % NaCl with KCl 20 mEq / L    . [DISCONTINUED] lactated ringers    . [DISCONTINUED] lactated ringers     PRN: colchicine, morphine injection, ondansetron (ZOFRAN) IV, ondansetron, oxyCODONE-acetaminophen, [DISCONTINUED]  HYDROmorphone (DILAUDID) injection, [DISCONTINUED] meperidine (DEMEROL) injection, [DISCONTINUED] promethazine Assessment: 72 yo M admitted with diverticular abscess. For colectomy today.   Goal of Therapy:  Eradication of infection  Plan:  1. ) Zosyn 3.375 Gm IV q 8 hours. Each bolus infused over 4 hours.  Follow up culture results  Loletta Specter 04/07/2012,4:50 PM

## 2012-04-07 NOTE — Anesthesia Preprocedure Evaluation (Addendum)
Anesthesia Evaluation  Patient identified by MRN, date of birth, ID band Patient awake    Reviewed: Allergy & Precautions, H&P , NPO status , Patient's Chart, lab work & pertinent test results  History of Anesthesia Complications (+) DIFFICULT AIRWAY  Airway Mallampati: II TM Distance: >3 FB Neck ROM: Full    Dental no notable dental hx.    Pulmonary neg pulmonary ROS, former smoker,  breath sounds clear to auscultation  Pulmonary exam normal       Cardiovascular hypertension, Pt. on medications and Pt. on home beta blockers - angina+ CAD and + Past MI + dysrhythmias Atrial Fibrillation Rhythm:Regular Rate:Normal     Neuro/Psych negative neurological ROS  negative psych ROS   GI/Hepatic negative GI ROS, Neg liver ROS,   Endo/Other  negative endocrine ROS  Renal/GU negative Renal ROS  negative genitourinary   Musculoskeletal negative musculoskeletal ROS (+)   Abdominal   Peds negative pediatric ROS (+)  Hematology negative hematology ROS (+)   Anesthesia Other Findings   Reproductive/Obstetrics negative OB ROS                          Anesthesia Physical Anesthesia Plan  ASA: III  Anesthesia Plan: General   Post-op Pain Management:    Induction: Intravenous  Airway Management Planned: Oral ETT and Video Laryngoscope Planned  Additional Equipment:   Intra-op Plan:   Post-operative Plan: Extubation in OR  Informed Consent: I have reviewed the patients History and Physical, chart, labs and discussed the procedure including the risks, benefits and alternatives for the proposed anesthesia with the patient or authorized representative who has indicated his/her understanding and acceptance.   Dental advisory given  Plan Discussed with: CRNA  Anesthesia Plan Comments:         Anesthesia Quick Evaluation

## 2012-04-07 NOTE — Anesthesia Postprocedure Evaluation (Signed)
  Anesthesia Post-op Note  Patient: [name].  Procedure(s) Performed: Procedure(s) (LRB): COLOSTOMY (N/A) PARTIAL COLECTOMY (N/A)  Patient Location: PACU  Anesthesia Type: General  Level of Consciousness: awake and alert   Airway and Oxygen Therapy: Patient Spontanous Breathing  Post-op Pain: mild  Post-op Assessment: Post-op Vital signs reviewed, Patient's Cardiovascular Status Stable, Respiratory Function Stable, Patent Airway and No signs of Nausea or vomiting  Last Vitals:  Filed Vitals:   04/07/12 1614  BP: 199/97  Pulse: 80  Temp: 36.6 C  Resp: 18    Post-op Vital Signs: stable   Complications: No apparent anesthesia complications

## 2012-04-07 NOTE — Transfer of Care (Signed)
Immediate Anesthesia Transfer of Care Note  Patient: [name].  Procedure(s) Performed: Procedure(s) (LRB): COLOSTOMY (N/A) PARTIAL COLECTOMY (N/A)  Patient Location: PACU  Anesthesia Type: General  Level of Consciousness: sedated, patient cooperative and responds to stimulaton  Airway & Oxygen Therapy: Patient Spontanous Breathing and Patient connected to face mask oxgen  Post-op Assessment: Report given to PACU RN and Post -op Vital signs reviewed and stable  Post vital signs: Reviewed and stable  Complications: No apparent anesthesia complications

## 2012-04-07 NOTE — H&P (Signed)
CC  Colonic Abscess  History: The patient presents for surgery due to a persistent pericolonic diverticular abscess. He has had multiple courses of antibiotics with no significant change, with a thickwalled 5 cm cavity and surrounding inflammatory change at the sigmoid colon. In addition the appendix is noted to be dilated and inflamed which was adjacent to the abscess. He has some persistent abdominal soreness but no severe pain. He just completed another course of antibiotics and did not have any further fever or chills on antibiotics. He does have some pain with bowel movements. He has recurrent chills when off antibiotics  Past Medical History  Diagnosis Date  . Hyperglycemia   . CAD (coronary artery disease)   . Hyperlipidemia   . Chronic atrial fibrillation     Dr. Clifton James  . Anemia   . Gilbert's syndrome   . Hypertension   . Hemorrhoids   . Cataract     BILATERAL  . Renal insufficiency   . Myocardial infarction     history of. 1983  . AAA (abdominal aortic aneurysm)   . Duodenal ulcer   . History of GI bleed   . Heart murmur   . Heart attack   . History of blood transfusion   . Difficult intubation 2005   Past Surgical History  Procedure Laterality Date  . Cath/angioplasy      [id number], [place] by Dr. Brett Canales [facility] of angioplasty)  . Carotid endarterectomy      bilaterally 2007  . Tonsillectomy    . No colonoscopy to date      ("I don't have a good excuse"). SOC reviewed  . Cataract extraction    . Pyloric stenosis    . Esophagogastroduodenoscopy  04/06/2011    Procedure: ESOPHAGOGASTRODUODENOSCOPY (EGD);  Surgeon: Theda Belfast, MD;  Location: [facility];  Service: Endoscopy;  Laterality: N/A;  . Eye surgery  2011    bilateral   Current Facility-Administered Medications  Medication Dose Route Frequency Provider Last Rate Last Dose  . cefOXitin (MEFOXIN) 2 g in dextrose 5 % 50 mL IVPB  2 g Intravenous On Call to OR Mariella Saa, MD        Exam:   BP 132/72  Pulse 93  Temp 97 F (36.1 C)  Resp 18  Ht 5\' 9"  (1.753 m)  Wt 168 lb (76.204 kg)  BMI 24.81 kg/m2  General: No acute distress  Skin: Warm and dry  Lungs: Clear equal breath sounds without increased work of breathing  Cardiac: Irregular rhythm. No edema  Abdomen: Mild lower abdominal tenderness with minimal guarding. No palpable masses.  Extremities: No edema    Assessment and plan: Persistent diverticular abscess sigmoid colon with no improvement after multiple courses of outpatient antibiotics. Interventional radiology per history as previously ruled out percutaneous drainage . Assuming this is not possible I think we will need to proceed with sigmoid colectomy. We discussed options which really would include only continued antibiotic treatment and I think this is very unlikely to be successful and he is in agreement. We discussed the nature of the surgery including the possibility of colostomy as well as risks of anesthetic complications, bleeding, infection, anastomotic leak. Get cardiac clearance preoperatively and recommendations regarding anticoagulation. All of his and his wife's questions were answered.

## 2012-04-08 LAB — BASIC METABOLIC PANEL
BUN: 9 mg/dL (ref 6–23)
CO2: 25 mEq/L (ref 19–32)
Chloride: 100 mEq/L (ref 96–112)
GFR calc Af Amer: >90 mL/min (ref >90)
Potassium: 4.1 mEq/L (ref 3.5–5.1)

## 2012-04-08 LAB — CBC
HCT: 32.9 % — ABNORMAL LOW (ref 39.0–52.0)
Hemoglobin: 10.8 g/dL — ABNORMAL LOW (ref 13.0–17.0)
MCV: 97.3 fL (ref 78.0–100.0)
Platelets: 134 10*3/uL — ABNORMAL LOW (ref 150–400)
RDW: 15.6 % — ABNORMAL HIGH (ref 11.5–15.5)

## 2012-04-08 NOTE — Care Management Note (Addendum)
    Page 1 of 2   04/10/2012     9:59:36 AM   CARE MANAGEMENT NOTE 04/10/2012  Patient:  [name], [name]   Account Number:  [id number]  Date Initiated:  04/08/2012  Documentation initiated by:  Lorenda Ishihara  Subjective/Objective Assessment:   72 yo male admitted s/p colectomy and colostomy. PTA lived at home with spouse, also has caregivers in home at night provided by [employer].     Action/Plan:   Home when stable   Anticipated DC Date:  04/11/2012   Anticipated DC Plan:  HOME W HOME HEALTH SERVICES      DC Planning Services  CM consult      [facility] Choice  HOME HEALTH   Choice offered to / List presented to:  C-3 Spouse        HH arranged  HH-1 RN      [facility] agency  [facility]   Status of service:  Completed, signed off Medicare Important Message given?   (If response is "NO", the following Medicare IM given date fields will be blank) Date Medicare IM given:   Date Additional Medicare IM given:    Discharge Disposition:  HOME W HOME HEALTH SERVICES  Per UR Regulation:  Reviewed for med. necessity/level of care/duration of stay  If discussed at Long Length of Stay Meetings, dates discussed:    Comments:  04-10-12 Lorenda Ishihara RN CM 1000 Spoke with patient and spouse at bedside. They have chosen [facility] per physician comments, contacted Baxter Hire with [facility] to arrange, she will follow for d/c needs.  ---04/08/2012 1140 by NIA [facility]--- PROVIDERS IN-NETWORK  ARE Melvenia Needles, [place]   04-09-11 Lorenda Ishihara RN CM 1000 Spoke with patient at bedside. Appeared somewhat confused but pleasantly so, cooperative. Unable to remember home care agency that is currently in place, states they provide custodial services. Referred me to his wife who was sitting in the waiting area. Wife states active with [facility] for sitters, this was recently started. Wife provided with a list of HH agencies for choice, also doing benefits check to see who preferred  provider would be. Wife anxious about d/c plans, questioning if he will need a hospital bed at d/c. Discussed different DME needs patient may have. Will await once out of bed and assess needs at that time. Will need HHRN to assist with wound care and ostomy care.

## 2012-04-08 NOTE — Progress Notes (Signed)
Patient ID: Martin Garnet., male   DOB: 1940/06/10, 73 y.o.   MRN: [id number] 1 Day Post-Op  Subjective: Some abdominal and incisional pain but not severe  Objective: Vital signs in last 24 hours: Temp:  [97.5 F (36.4 C)-98.8 F (37.1 C)] 97.8 F (36.6 C) (02/26 1000) Pulse Rate:  [72-104] 95 (02/26 1000) Resp:  [14-28] 18 (02/26 1000) BP: (143-199)/(77-98) 163/98 mmHg (02/26 1000) SpO2:  [94 %-100 %] 94 % (02/26 1000) Weight:  [169 lb 1.5 oz (76.7 kg)] 169 lb 1.5 oz (76.7 kg) (02/25 1614) Last BM Date: 04/07/12 (PTA)  Intake/Output from previous day: 02/25 0701 - 02/26 0700 In: 2205 [I.V.:2205] Out: 720 [Urine:475; Drains:95; Blood:150] Intake/Output this shift: Total I/O In: 385 [I.V.:385] Out: 40 [Drains:40]  General appearance: alert, cooperative and no distress GI: moderate diffuse tenderness especially around the incision. Stoma appears healthy. Incision/Wound: packed open with slight bleeding inferiorly and appears clean  Lab Results:   Recent Labs  04/08/12 0407  WBC 10.6*  HGB 10.8*  HCT 32.9*  PLT 134*   BMET  Recent Labs  04/08/12 0407  NA 134*  K 4.1  CL 100  CO2 25  GLUCOSE 85  BUN 9  CREATININE 0.83  CALCIUM 8.4     Studies/Results: No results found.  Anti-infectives: Anti-infectives   Start     Dose/Rate Route Frequency Ordered Stop   04/07/12 1800  piperacillin-tazobactam (ZOSYN) IVPB 3.375 g  Status:  Discontinued     3.375 g 100 mL/hr over 30 Minutes Intravenous Every 8 hours 04/07/12 1643 04/07/12 1659   04/07/12 1800  piperacillin-tazobactam (ZOSYN) IVPB 3.375 g     3.375 g 12.5 mL/hr over 240 Minutes Intravenous 3 times per day 04/07/12 1659     04/07/12 1100  cefOXitin (MEFOXIN) 2 g in dextrose 5 % 50 mL IVPB     2 g 100 mL/hr over 30 Minutes Intravenous On call to O.R. 04/07/12 1100 04/07/12 1308      Assessment/Plan: s/p Procedure(s): COLOSTOMY PARTIAL COLECTOMY Stable postoperatively. Continue IV antibiotics  and wound care. Start clear liquid diet.   LOS: 1 day    Sol Odor T 04/08/2012

## 2012-04-08 NOTE — Consult Note (Signed)
WOC ostomy consult  Stoma type/location: LLQ, end colostomy Stomal assessment/size: aprox. 1 3/4" round, did not take pouch off today first day post op and pt with diffuse tenderness Output: bloody output only Ostomy pouching: 1pc karaya placed from OR. Discussed with pt that we will teach him use much lower profile pouch, he would not look down at abdomen for teaching today even after several times to offer support and encouragement.   Education provided: colostomy educational booklet left in room, however pt would not take from me, asked me to leave it over with his wife's things.  Supplies placed in room and I explained we will perform pouch change at next visit with WOC nurse.  Pt is very apprehensive about pain with pouch change.   WOC team will follow along with you for ostomy teaching and support Perri Aragones [place], [place] [phone]

## 2012-04-08 NOTE — Progress Notes (Signed)
Dr. Johna Sheriff on floor to see patient, iformed that b/p elevated but patient received metoprolol ER this am. Also that patient having significant soreness to abd, Md states to give whole morphine 4mg  before drsg chagne

## 2012-04-09 ENCOUNTER — Encounter (HOSPITAL_COMMUNITY): Payer: Self-pay | Admitting: General Surgery

## 2012-04-09 MED FILL — Dextrose Inj 5%: INTRAVENOUS | Qty: 50 | Status: AC

## 2012-04-09 MED FILL — Cefoxitin Sodium For IV Soln 1 GM: INTRAVENOUS | Qty: 2 | Status: AC

## 2012-04-09 MED FILL — Cefoxitin Sodium IV For Soln 2 GM and Dextrose 2.2% (50 ML): INTRAVENOUS | Qty: 50 | Status: AC

## 2012-04-09 NOTE — Progress Notes (Signed)
Patient ID: [name]., male   DOB: 1940/03/16, 72 y.o.   MRN: [id number] 2 Days Post-Op  Subjective: No C/O this AM.  Tol CL without nausea. Gas and some stool per colostomy.  Has been up  Objective: Vital signs in last 24 hours: Temp:  [97.8 F (36.6 C)-98.3 F (36.8 C)] 98.3 F (36.8 C) (02/27 0559) Pulse Rate:  [73-99] 98 (02/27 0559) Resp:  [18-20] 20 (02/27 0559) BP: (139-169)/(81-101) 157/81 mmHg (02/27 0559) SpO2:  [94 %-96 %] 95 % (02/27 0559) Last BM Date: 04/07/12  Intake/Output from previous day: 02/26 0701 - 02/27 0700 In: 2705 [P.O.:720; I.V.:1985] Out: 1320 [Urine:1050; Drains:270] Intake/Output this shift: Total I/O In: -  Out: 100 [Urine:100]  General appearance: alert, cooperative and no distress GI: Mild tenderness around stoma and incision.  Gas in bag and stoma healthy Incision/Wound: Dressing intact, did not examine wound.  JP drainage serous  Lab Results:   Recent Labs  04/08/12 0407  WBC 10.6*  HGB 10.8*  HCT 32.9*  PLT 134*   BMET  Recent Labs  04/08/12 0407  NA 134*  K 4.1  CL 100  CO2 25  GLUCOSE 85  BUN 9  CREATININE 0.83  CALCIUM 8.4     Studies/Results: No results found.  Anti-infectives: Anti-infectives   Start     Dose/Rate Route Frequency Ordered Stop   04/07/12 1800  piperacillin-tazobactam (ZOSYN) IVPB 3.375 g  Status:  Discontinued     3.375 g 100 mL/hr over 30 Minutes Intravenous Every 8 hours 04/07/12 1643 04/07/12 1659   04/07/12 1800  piperacillin-tazobactam (ZOSYN) IVPB 3.375 g     3.375 g 12.5 mL/hr over 240 Minutes Intravenous 3 times per day 04/07/12 1659     04/07/12 1100  cefOXitin (MEFOXIN) 2 g in dextrose 5 % 50 mL IVPB     2 g 100 mL/hr over 30 Minutes Intravenous On call to O.R. 04/07/12 1100 04/07/12 1308      Assessment/Plan: s/p Procedure(s): COLOSTOMY PARTIAL COLECTOMY Doing well Advance to FL diet Lab in AM Cont abx for now   LOS: 2 days    Alonza Knisley  T 04/09/2012 fg

## 2012-04-09 NOTE — Progress Notes (Signed)
Pt refused ambulation at 2000.  Said he would ambulate in the am.  Pt stated that he understood the importance of getting up and being mobile but prefers to wait until after he has had some rest.  Taichi Repka L

## 2012-04-10 ENCOUNTER — Encounter (INDEPENDENT_AMBULATORY_CARE_PROVIDER_SITE_OTHER): Payer: Self-pay

## 2012-04-10 LAB — CULTURE, ROUTINE-ABSCESS: Culture: NO GROWTH

## 2012-04-10 LAB — BASIC METABOLIC PANEL
Chloride: 98 mEq/L (ref 96–112)
Creatinine, Ser: 0.84 mg/dL (ref 0.50–1.35)
GFR calc Af Amer: >90 mL/min (ref >90)
GFR calc non Af Amer: 85 mL/min — ABNORMAL LOW (ref >90)

## 2012-04-10 LAB — CBC
MCHC: 31.8 g/dL (ref 30.0–36.0)
MCV: 97.3 fL (ref 78.0–100.0)
Platelets: 175 10*3/uL (ref 150–400)
RDW: 15.3 % (ref 11.5–15.5)
WBC: 8.7 10*3/uL (ref 4.0–10.5)

## 2012-04-10 MED ORDER — INDOMETHACIN ER 75 MG PO CPCR
75.0000 mg | ORAL_CAPSULE | Freq: Once | ORAL | Status: AC
  Administered 2012-04-10: 75 mg via ORAL
  Filled 2012-04-10: qty 1

## 2012-04-10 MED ORDER — PANTOPRAZOLE SODIUM 40 MG PO TBEC
40.0000 mg | DELAYED_RELEASE_TABLET | Freq: Every day | ORAL | Status: DC
  Administered 2012-04-10 – 2012-04-11 (×2): 40 mg via ORAL
  Filled 2012-04-10 (×2): qty 1

## 2012-04-10 NOTE — Progress Notes (Signed)
PHARMACIST - PHYSICIAN COMMUNICATION DR:   CCS CONCERNING: Proton Pump Inhibitor IV to Oral Route Change Policy  The patient is receiving Protonix by the intravenous route. Based on criteria approved by the Pharmacy and Therapeutics Committee and the Medical Executive Committee, the medication is being converted to the equivalent oral dose form.   These criteria include:  -No Active GI bleeding  -Able to tolerate diet of full liquids (or better) or tube feeding  -Able to tolerate other medications by the oral or enteral route   If you have any questions about this conversion, please contact the Pharmacy Department (ext [phone]). Thank you.  Loralee Pacas, PharmD, BCPS 04/10/2012 8:51 AM

## 2012-04-10 NOTE — Progress Notes (Signed)
Spoke with MD Streck about patient diet, order received for regular diet, order entered Jayvion, Stefanski RN 04-10-2012 13:06pm

## 2012-04-10 NOTE — Consult Note (Signed)
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 Stomal assessment/size: Stoma 1 1/2 inches, red and viable, slightly above skin level Peristomal assessment: Intact skin surrounding. Output 50cc liquid brown stool Ostomy pouching: 1pc. With barrier ring Education provided: Wife at bedside.  Demonstrated application of one piece pouch with barrier ring to maintain seal around stoma.  Wife able to assist with pouch application.  Pt able to open and close velcro to empty.  Discussed pouching routines and ordering supplies.  Placed on [facility] discharge program.  Supplies at bedside for staff or patient use.  Pt and wife deny further questions at this time.  Plan to be followed by home health.  Cammie Mcgee, RN, MSN, [employer]  [phone]

## 2012-04-10 NOTE — Progress Notes (Signed)
Patient ID: Martin Garnet., male   DOB: October 28, 1940, 72 y.o.   MRN: [id number] 3 Days Post-Op  Subjective: No C/O.  Tol FL well. Gas and some stool per colostomy.  Some increased pain toe from gout  Objective: Vital signs in last 24 hours: Temp:  [97.9 F (36.6 C)-98.2 F (36.8 C)] 98.2 F (36.8 C) (02/28 0615) Pulse Rate:  [81-99] 89 (02/28 0615) Resp:  [18-20] 18 (02/28 0615) BP: (150-176)/(82-106) 175/90 mmHg (02/28 0615) SpO2:  [95 %-96 %] 96 % (02/28 0615) Last BM Date: 04/09/12  Intake/Output from previous day: 02/27 0701 - 02/28 0700 In: 2592.5 [P.O.:780; I.V.:1762.5; IV Piggyback:50] Out: 2025 [Urine:1575; Drains:450] Intake/Output this shift:    General appearance: alert, cooperative and no distress GI: normal findings: soft, non-tender and stoma healthy Incision/Wound: open, very clean  Lab Results:   Recent Labs  04/08/12 0407 04/10/12 0405  WBC 10.6* 8.7  HGB 10.8* 10.2*  HCT 32.9* 32.1*  PLT 134* 175   BMET  Recent Labs  04/08/12 0407 04/10/12 0405  NA 134* 131*  K 4.1 3.7  CL 100 98  CO2 25 25  GLUCOSE 85 103*  BUN 9 5*  CREATININE 0.83 0.84  CALCIUM 8.4 8.1*     Studies/Results: No results found.  Anti-infectives: Anti-infectives   Start     Dose/Rate Route Frequency Ordered Stop   04/07/12 1800  piperacillin-tazobactam (ZOSYN) IVPB 3.375 g  Status:  Discontinued     3.375 g 100 mL/hr over 30 Minutes Intravenous Every 8 hours 04/07/12 1643 04/07/12 1659   04/07/12 1800  piperacillin-tazobactam (ZOSYN) IVPB 3.375 g     3.375 g 12.5 mL/hr over 240 Minutes Intravenous 3 times per day 04/07/12 1659     04/07/12 1100  cefOXitin (MEFOXIN) 2 g in dextrose 5 % 50 mL IVPB     2 g 100 mL/hr over 30 Minutes Intravenous On call to O.R. 04/07/12 1100 04/07/12 1308      Assessment/Plan: s/p Procedure(s): COLOSTOMY PARTIAL COLECTOMY Doing well Advance diet Continue abx until discharge Anticipate discharge Sunday Indomethicin one  dose for gout  LOS: 3 days    [name] T 04/10/2012

## 2012-04-10 NOTE — Progress Notes (Signed)
ANTIBIOTIC CONSULT NOTE - FOLLOW UP  Pharmacy Consult for Zosyn Indication: Diverticular abscess  No Known Allergies  Patient Measurements: Height: 5' 8.11" (173 cm) (Documented on 04/03/2012) Weight: 169 lb 1.5 oz (76.7 kg) (Documented 04/03/2012) IBW/kg (Calculated) : 68.65  Vital Signs: Temp: 98.2 F (36.8 C) (02/28 0615) Temp src: Oral (02/28 0615) BP: 175/90 mmHg (02/28 0615) Pulse Rate: 89 (02/28 0615) Intake/Output from previous day: 02/27 0701 - 02/28 0700 In: 2592.5 [P.O.:780; I.V.:1762.5; IV Piggyback:50] Out: 2025 [Urine:1575; Drains:450] Intake/Output from this shift:    Labs:  Recent Labs  04/08/12 0407 04/10/12 0405  WBC 10.6* 8.7  HGB 10.8* 10.2*  PLT 134* 175  CREATININE 0.83 0.84   Estimated Creatinine Clearance: 77.2 ml/min (by C-G formula based on Cr of 0.84).   Microbiology: Recent Results (from the past 720 hour(s))  ANAEROBIC CULTURE     Status: None   Collection Time    04/07/12  1:21 PM      Result Value Range Status   Specimen Description ABSCESS PELVIC FLUID   Final   Special Requests NONE   Final   Gram Stain     Final   Value: ABUNDANT WBC PRESENT,BOTH PMN AND MONONUCLEAR     FEW GRAM POSITIVE RODS     FEW GRAM POSITIVE COCCI IN PAIRS   Culture     Final   Value: NO ANAEROBES ISOLATED; CULTURE IN PROGRESS FOR 5 DAYS   Report Status PENDING   Incomplete  CULTURE, ROUTINE-ABSCESS     Status: None   Collection Time    04/07/12  1:21 PM      Result Value Range Status   Specimen Description ABSCESS PELVIC FLUID   Final   Special Requests NONE   Final   Gram Stain     Final   Value: ABUNDANT WBC PRESENT,BOTH PMN AND MONONUCLEAR     FEW GRAM POSITIVE RODS     FEW GRAM POSITIVE COCCI IN PAIRS   Culture NO GROWTH 3 DAYS   Final   Report Status 04/10/2012 FINAL   Final    Anti-infectives   Start     Dose/Rate Route Frequency Ordered Stop   04/07/12 1800  piperacillin-tazobactam (ZOSYN) IVPB 3.375 g  Status:  Discontinued     3.375  g 100 mL/hr over 30 Minutes Intravenous Every 8 hours 04/07/12 1643 04/07/12 1659   04/07/12 1800  piperacillin-tazobactam (ZOSYN) IVPB 3.375 g     3.375 g 12.5 mL/hr over 240 Minutes Intravenous 3 times per day 04/07/12 1659     04/07/12 1100  cefOXitin (MEFOXIN) 2 g in dextrose 5 % 50 mL IVPB     2 g 100 mL/hr over 30 Minutes Intravenous On call to O.R. 04/07/12 1100 04/07/12 1308      Assessment:  71 yom admitted with diverticular abscess s/p colectomy 2/25  Day #4 Zosyn, abscess cultures negative to date  AFebrile, WBC improved to normal, SCr stable  Standard dose zosyn remains appropriate  Plans noted to continue antibiotics until discharge (possibly Sunday)  Goal of Therapy:  Eradication of infection Dose per renal function  Plan:   Continue Zosyn 3.375gm IV q8h (4hr extended infusions) Follow up renal function & cultures   Loralee Pacas, PharmD, BCPS Pager: [phone] 04/10/2012,8:45 AM

## 2012-04-11 MED ORDER — OXYCODONE-ACETAMINOPHEN 5-325 MG PO TABS: 1.0000 | ORAL_TABLET | ORAL | Status: DC | PRN

## 2012-04-11 NOTE — Care Management Note (Signed)
   CARE MANAGEMENT NOTE 04/11/2012  Patient:  [name], [name]   Account Number:  [id number]  Date Initiated:  04/08/2012  Documentation initiated by:  Lorenda Ishihara  Subjective/Objective Assessment:   72 yo male admitted s/p colectomy and colostomy. PTA lived at home with spouse, also has caregivers in home at night provided by [employer].     Action/Plan:   Home when stable   Anticipated DC Date:  04/11/2012   Anticipated DC Plan:  HOME W HOME HEALTH SERVICES      DC Planning Services  CM consult      [facility] Choice  HOME HEALTH   Choice offered to / List presented to:  C-3 Spouse   DME arranged  HOSPITAL BED      DME agency  [facility]     [facility] arranged  HH-1 RN      [facility] agency  [facility]   Status of service:  Completed, signed off Medicare Important Message given?   (If response is "NO", the following Medicare IM given date fields will be blank) Date Medicare IM given:   Date Additional Medicare IM given:    Discharge Disposition:  HOME W HOME HEALTH SERVICES  Per UR Regulation:  Reviewed for med. necessity/level of care/duration of stay  If discussed at Long Length of Stay Meetings, dates discussed:    Comments:  04/11/2012 Noted order for hospital bed, info faxed to [facility] and [facility] called and notifed of pt need. [facility] will contact pt at home re hospital bed and setup delivery. Johny Shock RN MPH Case Manager  04-10-12 Lorenda Ishihara RN CM 1000 Spoke with patient and spouse at bedside. They have chosen [facility] per physician comments, contacted Baxter Hire with [facility] to arrange, she will follow for d/c needs.  ---04/08/2012 1140 by NIA [facility]--- PROVIDERS IN-NETWORK  ARE Melvenia Needles, [facility]   04-09-11 Lorenda Ishihara RN CM 1000 Spoke with patient at bedside. Appeared somewhat confused but pleasantly so, cooperative. Unable to remember home care agency that is currently in place, states they provide custodial services. Referred me to his  wife who was sitting in the waiting area. Wife states active with [facility] for sitters, this was recently started. Wife provided with a list of HH agencies for choice, also doing benefits check to see who preferred provider would be. Wife anxious about d/c plans, questioning if he will need a hospital bed at d/c. Discussed different DME needs patient may have. Will await once out of bed and assess needs at that time. Will need HHRN to assist with wound care and ostomy care.

## 2012-04-11 NOTE — Progress Notes (Signed)
Patient discharged via wheelchair. Refused to empty colostomy into toilet, after much encouragement, did open pouch and was emptied into graduated cylander, patient wiped end with wick and closed pouch back. Rx for percocet given. States understanding of discharge instructions. I have spoken with case management states everything set up for [facility] tomorrow.

## 2012-04-11 NOTE — Progress Notes (Signed)
4 Days Post-Op  Subjective: Looks great tolerating diet ostomy working  Objective: Vital signs in last 24 hours: Temp:  [97.6 F (36.4 C)-98.3 F (36.8 C)] 97.6 F (36.4 C) (03/01 0534) Pulse Rate:  [67-93] 93 (03/01 0941) Resp:  [18] 18 (03/01 0534) BP: (152-184)/(88-109) 159/88 mmHg (03/01 0941) SpO2:  [96 %-97 %] 97 % (03/01 0534) Last BM Date: 04/09/12  Intake/Output from previous day: 02/28 0701 - 03/01 0700 In: 1854.2 [P.O.:440; I.V.:1314.2; IV Piggyback:100] Out: 3030 [Urine:2775; Drains:95; Stool:160] Intake/Output this shift: Total I/O In: -  Out: 400 [Urine:400]  Incision/Wound:open clean  Ostomy functioning.  Soft non distended  Lab Results:   Recent Labs  04/10/12 0405  WBC 8.7  HGB 10.2*  HCT 32.1*  PLT 175   BMET  Recent Labs  04/10/12 0405  NA 131*  K 3.7  CL 98  CO2 25  GLUCOSE 103*  BUN 5*  CREATININE 0.84  CALCIUM 8.1*   PT/INR No results found for this basename: LABPROT, INR,  in the last 72 hours ABG No results found for this basename: PHART, PCO2, PO2, HCO3,  in the last 72 hours  Studies/Results: No results found.  Anti-infectives: Anti-infectives   Start     Dose/Rate Route Frequency Ordered Stop   04/07/12 1800  piperacillin-tazobactam (ZOSYN) IVPB 3.375 g  Status:  Discontinued     3.375 g 100 mL/hr over 30 Minutes Intravenous Every 8 hours 04/07/12 1643 04/07/12 1659   04/07/12 1800  piperacillin-tazobactam (ZOSYN) IVPB 3.375 g     3.375 g 12.5 mL/hr over 240 Minutes Intravenous 3 times per day 04/07/12 1659     04/07/12 1100  cefOXitin (MEFOXIN) 2 g in dextrose 5 % 50 mL IVPB     2 g 100 mL/hr over 30 Minutes Intravenous On call to O.R. 04/07/12 1100 04/07/12 1308      Assessment/Plan: s/p Procedure(s) with comments: COLOSTOMY (N/A) - Sigmoid Colectomy Colostomy  PARTIAL COLECTOMY (N/A) - Sigmoid Colectomy Colostomy hartman procedure Discharge  LOS: 4 days    Aiyana Stegmann A. 04/11/2012

## 2012-04-12 DIAGNOSIS — D649 Anemia, unspecified: Secondary | ICD-10-CM | POA: Diagnosis not present

## 2012-04-12 DIAGNOSIS — I2510 Atherosclerotic heart disease of native coronary artery without angina pectoris: Secondary | ICD-10-CM | POA: Diagnosis not present

## 2012-04-12 DIAGNOSIS — I4891 Unspecified atrial fibrillation: Secondary | ICD-10-CM | POA: Diagnosis not present

## 2012-04-12 DIAGNOSIS — Z433 Encounter for attention to colostomy: Secondary | ICD-10-CM | POA: Diagnosis not present

## 2012-04-12 DIAGNOSIS — Z4801 Encounter for change or removal of surgical wound dressing: Secondary | ICD-10-CM | POA: Diagnosis not present

## 2012-04-12 LAB — ANAEROBIC CULTURE

## 2012-04-13 DIAGNOSIS — Z4801 Encounter for change or removal of surgical wound dressing: Secondary | ICD-10-CM | POA: Diagnosis not present

## 2012-04-13 DIAGNOSIS — I2510 Atherosclerotic heart disease of native coronary artery without angina pectoris: Secondary | ICD-10-CM | POA: Diagnosis not present

## 2012-04-13 DIAGNOSIS — D649 Anemia, unspecified: Secondary | ICD-10-CM | POA: Diagnosis not present

## 2012-04-13 DIAGNOSIS — I4891 Unspecified atrial fibrillation: Secondary | ICD-10-CM | POA: Diagnosis not present

## 2012-04-13 DIAGNOSIS — Z433 Encounter for attention to colostomy: Secondary | ICD-10-CM | POA: Diagnosis not present

## 2012-04-14 DIAGNOSIS — Z433 Encounter for attention to colostomy: Secondary | ICD-10-CM | POA: Diagnosis not present

## 2012-04-14 DIAGNOSIS — Z4801 Encounter for change or removal of surgical wound dressing: Secondary | ICD-10-CM | POA: Diagnosis not present

## 2012-04-14 DIAGNOSIS — I4891 Unspecified atrial fibrillation: Secondary | ICD-10-CM | POA: Diagnosis not present

## 2012-04-14 DIAGNOSIS — D649 Anemia, unspecified: Secondary | ICD-10-CM | POA: Diagnosis not present

## 2012-04-14 DIAGNOSIS — I2510 Atherosclerotic heart disease of native coronary artery without angina pectoris: Secondary | ICD-10-CM | POA: Diagnosis not present

## 2012-04-16 DIAGNOSIS — I4891 Unspecified atrial fibrillation: Secondary | ICD-10-CM | POA: Diagnosis not present

## 2012-04-16 DIAGNOSIS — Z433 Encounter for attention to colostomy: Secondary | ICD-10-CM | POA: Diagnosis not present

## 2012-04-16 DIAGNOSIS — I2510 Atherosclerotic heart disease of native coronary artery without angina pectoris: Secondary | ICD-10-CM | POA: Diagnosis not present

## 2012-04-16 DIAGNOSIS — D649 Anemia, unspecified: Secondary | ICD-10-CM | POA: Diagnosis not present

## 2012-04-16 DIAGNOSIS — Z4801 Encounter for change or removal of surgical wound dressing: Secondary | ICD-10-CM | POA: Diagnosis not present

## 2012-04-16 NOTE — Discharge Summary (Signed)
  Patient ID: [name] [id number] 72 y.o. Jun 09, 1940  04/07/2012  Discharge date and time: 04/11/2012   Admitting Physician: Glenna Fellows T  Discharge Physician: Glenna Fellows T  Admission Diagnoses: DIVERTICULAR ABSCESS  Discharge Diagnoses: Same  Operations: Procedure(s): COLOSTOMY PARTIAL COLECTOMY  Admission Condition: fair  Discharged Condition: good  Indication for Admission: patient is a 72 year old male who approximately 3 months ago underwent a CT scan of the abdomen and pelvis due to malaise and weight loss. He also been having some chills. On questioning he had some vague lower abdominal discomfort. This was significant for a 5 cm pericolonic diverticular abscess. It was not amenable to percutaneous drainage. The patient has been through multiple courses of antibiotics and has recurrent chills and malaise each time the antibiotics were stopped. Followup scan has shown no improvement in the abscess. After extensive discussion detailed elsewhere he is admitted electively for colectomy with possible colostomy.  Hospital Course: the patient was admitted on the morning of this procedure. At the time of surgery he was found to have and expected 5 or 6 cm abscess which was in the pelvis between the distal sigmoid in the bladder. There was also significant edema and inflammatory change extending down into the low pelvis around the rectosigmoid and we did not feel it was safe to perform a primary anastomosis. He underwent [name] colectomy with drainage of his abscess. His postoperative course was very unremarkable. His wound was left open and he was started on daily moist saline dressing changes. He had mild pain easily controlled with medications. He was started on a clear liquid diet on the first postoperative day and advanced without difficulty. He was seen by the ostomy nurse and given instructions and home health was arranged. On the fourth postoperative day he is  up and about and tolerating a regular diet. Vital signs are stable. CBC is unremarkable. His wound is clean and ostomy functioning well. He is ready for discharge.   Disposition: Home  Patient Instructions:    Medication List    TAKE these medications       allopurinol 100 MG tablet  Commonly known as:  ZYLOPRIM  Take 100 mg by mouth every morning.     colchicine 0.6 MG tablet  Take 0.6 mg by mouth daily as needed (For gout.).     ferrous sulfate 325 (65 FE) MG tablet  Take 325 mg by mouth every morning.     metoprolol succinate 100 MG 24 hr tablet  Commonly known as:  TOPROL-XL  Take 100 mg by mouth daily after breakfast.     omeprazole 40 MG capsule  Commonly known as:  PRILOSEC  Take 40 mg by mouth daily.     oxyCODONE-acetaminophen 5-325 MG per tablet  Commonly known as:  PERCOCET/ROXICET  Take 1-2 tablets by mouth every 4 (four) hours as needed.     PRADAXA 150 MG Caps  Generic drug:  dabigatran  Take 150 mg by mouth 2 (two) times daily.     rosuvastatin 20 MG tablet  Commonly known as:  CRESTOR  Take 20 mg by mouth 2 (two) times a week. He takes on Monday and Friday.        Activity: no lifting, driving, or strenuous exercise for 4 Diet: regular diet Wound Care: daily moist saline gauze packing  Follow-up:  With Dr. Johna Sheriff in 3 weeks.  Signed: Mariella Saa MD, FACS  04/16/2012, 3:44 PM

## 2012-04-20 ENCOUNTER — Telehealth (INDEPENDENT_AMBULATORY_CARE_PROVIDER_SITE_OTHER): Payer: Self-pay

## 2012-04-20 DIAGNOSIS — Z4801 Encounter for change or removal of surgical wound dressing: Secondary | ICD-10-CM | POA: Diagnosis not present

## 2012-04-20 DIAGNOSIS — I4891 Unspecified atrial fibrillation: Secondary | ICD-10-CM | POA: Diagnosis not present

## 2012-04-20 DIAGNOSIS — I2510 Atherosclerotic heart disease of native coronary artery without angina pectoris: Secondary | ICD-10-CM | POA: Diagnosis not present

## 2012-04-20 DIAGNOSIS — D649 Anemia, unspecified: Secondary | ICD-10-CM | POA: Diagnosis not present

## 2012-04-20 DIAGNOSIS — Z433 Encounter for attention to colostomy: Secondary | ICD-10-CM | POA: Diagnosis not present

## 2012-04-20 NOTE — Telephone Encounter (Signed)
The patient called to schedule a follow up after surgery.  Please call.

## 2012-04-20 NOTE — Telephone Encounter (Signed)
Post op appointment scheduled for 04/30/12 @ 10:00 am w/Dr. Johna Sheriff.

## 2012-04-21 ENCOUNTER — Telehealth (INDEPENDENT_AMBULATORY_CARE_PROVIDER_SITE_OTHER): Payer: Self-pay | Admitting: General Surgery

## 2012-04-21 DIAGNOSIS — D649 Anemia, unspecified: Secondary | ICD-10-CM | POA: Diagnosis not present

## 2012-04-21 DIAGNOSIS — Z4801 Encounter for change or removal of surgical wound dressing: Secondary | ICD-10-CM | POA: Diagnosis not present

## 2012-04-21 DIAGNOSIS — I4891 Unspecified atrial fibrillation: Secondary | ICD-10-CM | POA: Diagnosis not present

## 2012-04-21 DIAGNOSIS — Z433 Encounter for attention to colostomy: Secondary | ICD-10-CM | POA: Diagnosis not present

## 2012-04-21 NOTE — Telephone Encounter (Signed)
Corrie Dandy, nurse with [facility], called to alert Dr. Johna Sheriff of this pt's difficultly accepting his colostomy.  He refuses to have anything to do with it and is resistant to having his wife do any of the care.  The home health nurse was called by him today to come out, telling he the bag had been torn off.  He only wants the nurse to take care of it.  The Home Health nurse will call him now and let him choose: either she can come now to take care of the ostomy or come again later in the week, but they can only make trips twice a week.  The nurse is concerned about his inability to accept his ostomy and wanted his surgeon to be aware.

## 2012-04-24 DIAGNOSIS — I4891 Unspecified atrial fibrillation: Secondary | ICD-10-CM | POA: Diagnosis not present

## 2012-04-24 DIAGNOSIS — Z433 Encounter for attention to colostomy: Secondary | ICD-10-CM | POA: Diagnosis not present

## 2012-04-24 DIAGNOSIS — D649 Anemia, unspecified: Secondary | ICD-10-CM | POA: Diagnosis not present

## 2012-04-24 DIAGNOSIS — I2510 Atherosclerotic heart disease of native coronary artery without angina pectoris: Secondary | ICD-10-CM | POA: Diagnosis not present

## 2012-04-24 DIAGNOSIS — Z4801 Encounter for change or removal of surgical wound dressing: Secondary | ICD-10-CM | POA: Diagnosis not present

## 2012-04-27 ENCOUNTER — Telehealth (INDEPENDENT_AMBULATORY_CARE_PROVIDER_SITE_OTHER): Payer: Self-pay | Admitting: General Surgery

## 2012-04-27 NOTE — Telephone Encounter (Signed)
OK to give fluconazole 200mg  PO daily x 3 days.  Add miconazole cream.  Ask operating surgeon, Dr. Johna Sheriff, for further advice

## 2012-04-27 NOTE — Telephone Encounter (Signed)
Mary, nurse with [facility], saw pt today and is highly suspicious for yeast around the stoma.  She applied miconazole powder, but thinks he may benefit from oral medication.  Also, nurse wants Dr. Michaell Cowing to be aware that pt is still refusing to care for his ostomy.  He will not empty the bag and tapes it up with duct tape.  He will not allow his wife to do any of the care or touch the bag.  He will only let the home health nurse take care of it.

## 2012-04-28 NOTE — Telephone Encounter (Signed)
Spoke with pt's wife and let her know Dr. Michaell Cowing has ordered meds to treat the yeast infection, both oral and cream.  She requests calling them in to the [facility] [address]:  [phone].  Called in Fluconazole 200 mg, # 3, 1 po QD x3 days, no refill and Miconazole cream, # 1 30 gram tube, AAA up to TID prn, no refill.  Reminded wife of pt's follow up appt on Thursday of this week and to bring ostomy supplies with them.  She understands.

## 2012-04-30 ENCOUNTER — Ambulatory Visit (INDEPENDENT_AMBULATORY_CARE_PROVIDER_SITE_OTHER): Payer: Managed Care, Other (non HMO) | Admitting: General Surgery

## 2012-04-30 ENCOUNTER — Encounter (INDEPENDENT_AMBULATORY_CARE_PROVIDER_SITE_OTHER): Payer: Self-pay | Admitting: General Surgery

## 2012-04-30 ENCOUNTER — Telehealth (INDEPENDENT_AMBULATORY_CARE_PROVIDER_SITE_OTHER): Payer: Self-pay

## 2012-04-30 ENCOUNTER — Other Ambulatory Visit (INDEPENDENT_AMBULATORY_CARE_PROVIDER_SITE_OTHER): Payer: Self-pay

## 2012-04-30 VITALS — BP 128/72 | HR 79 | Temp 98.0°F | Resp 18 | Ht 68.0 in | Wt 163.8 lb

## 2012-04-30 DIAGNOSIS — Z433 Encounter for attention to colostomy: Secondary | ICD-10-CM | POA: Diagnosis not present

## 2012-04-30 DIAGNOSIS — Z09 Encounter for follow-up examination after completed treatment for conditions other than malignant neoplasm: Secondary | ICD-10-CM

## 2012-04-30 DIAGNOSIS — I4891 Unspecified atrial fibrillation: Secondary | ICD-10-CM | POA: Diagnosis not present

## 2012-04-30 DIAGNOSIS — I2510 Atherosclerotic heart disease of native coronary artery without angina pectoris: Secondary | ICD-10-CM | POA: Diagnosis not present

## 2012-04-30 DIAGNOSIS — D649 Anemia, unspecified: Secondary | ICD-10-CM | POA: Diagnosis not present

## 2012-04-30 DIAGNOSIS — Z4801 Encounter for change or removal of surgical wound dressing: Secondary | ICD-10-CM | POA: Diagnosis not present

## 2012-04-30 NOTE — Telephone Encounter (Signed)
Called Mary @ [facility] to give verbal order per Dr. Johna Sheriff to continue home visits two (2)  times a week for six (6) weeks.

## 2012-04-30 NOTE — Progress Notes (Signed)
History: Patient returns for his first postoperative visit approximately 3 weeks following [name] colectomy for peridiverticular abscess. He generally is getting along well. He hates his colostomy and has had some difficulty working on it himself. He does however agree that he is doing well in that he is not having abdominal pain, his appetite is good and his activity is increasing. He has had no further chills or symptoms like he was having prior to his surgery.  Exam: BP 128/72  Pulse 79  Temp(Src) 98 F (36.7 C) (Temporal)  Resp 18  Ht 5\' 8"  (1.727 m)  Wt 163 lb 12.8 oz (74.299 kg)  BMI 24.91 kg/m2 General: Appears well in no distress Abdomen: Generally soft and nontender. Midline wound nearly healed with just a thin strip of clean granulation tissue. Stoma appears healthy. I did not remove the stoma device but he apparently has some skin irritation around the stoma.  Assessment and plan: Doing well following [name] colectomy for peridiverticular abscess. We will continue home health until I see him back to help with his wound and ostomy. We will get him in to see the ostomy nurse as an outpatient. Return in 6 weeks.

## 2012-05-04 DIAGNOSIS — Z433 Encounter for attention to colostomy: Secondary | ICD-10-CM | POA: Diagnosis not present

## 2012-05-04 DIAGNOSIS — I2510 Atherosclerotic heart disease of native coronary artery without angina pectoris: Secondary | ICD-10-CM | POA: Diagnosis not present

## 2012-05-04 DIAGNOSIS — I4891 Unspecified atrial fibrillation: Secondary | ICD-10-CM | POA: Diagnosis not present

## 2012-05-04 DIAGNOSIS — D649 Anemia, unspecified: Secondary | ICD-10-CM | POA: Diagnosis not present

## 2012-05-04 DIAGNOSIS — Z4801 Encounter for change or removal of surgical wound dressing: Secondary | ICD-10-CM | POA: Diagnosis not present

## 2012-05-05 ENCOUNTER — Telehealth (INDEPENDENT_AMBULATORY_CARE_PROVIDER_SITE_OTHER): Payer: Self-pay

## 2012-05-05 NOTE — Telephone Encounter (Signed)
Mary w/[facility] calling to see if they could change the pt's wound care orders from wet to dry daily care to calcium alginate on the midline incision. The nurse said the incision was hypergranulating so she wanted to use calcium alginate on the incision and then cover with calcium hydrogel. Pls advise.

## 2012-05-05 NOTE — Telephone Encounter (Signed)
LMOM w/Mary at [facility] notifying her that Dr Johna Sheriff said it was ok to change the dressing changes to calcium alginate and calcium hydrogel.

## 2012-05-08 DIAGNOSIS — I4891 Unspecified atrial fibrillation: Secondary | ICD-10-CM | POA: Diagnosis not present

## 2012-05-08 DIAGNOSIS — D649 Anemia, unspecified: Secondary | ICD-10-CM | POA: Diagnosis not present

## 2012-05-08 DIAGNOSIS — Z4801 Encounter for change or removal of surgical wound dressing: Secondary | ICD-10-CM | POA: Diagnosis not present

## 2012-05-08 DIAGNOSIS — Z433 Encounter for attention to colostomy: Secondary | ICD-10-CM | POA: Diagnosis not present

## 2012-05-08 DIAGNOSIS — I2510 Atherosclerotic heart disease of native coronary artery without angina pectoris: Secondary | ICD-10-CM | POA: Diagnosis not present

## 2012-05-11 DIAGNOSIS — I4891 Unspecified atrial fibrillation: Secondary | ICD-10-CM | POA: Diagnosis not present

## 2012-05-11 DIAGNOSIS — D649 Anemia, unspecified: Secondary | ICD-10-CM | POA: Diagnosis not present

## 2012-05-11 DIAGNOSIS — I2510 Atherosclerotic heart disease of native coronary artery without angina pectoris: Secondary | ICD-10-CM | POA: Diagnosis not present

## 2012-05-11 DIAGNOSIS — Z433 Encounter for attention to colostomy: Secondary | ICD-10-CM | POA: Diagnosis not present

## 2012-05-11 DIAGNOSIS — Z4801 Encounter for change or removal of surgical wound dressing: Secondary | ICD-10-CM | POA: Diagnosis not present

## 2012-05-15 DIAGNOSIS — I2510 Atherosclerotic heart disease of native coronary artery without angina pectoris: Secondary | ICD-10-CM | POA: Diagnosis not present

## 2012-05-15 DIAGNOSIS — Z433 Encounter for attention to colostomy: Secondary | ICD-10-CM | POA: Diagnosis not present

## 2012-05-15 DIAGNOSIS — D649 Anemia, unspecified: Secondary | ICD-10-CM | POA: Diagnosis not present

## 2012-05-15 DIAGNOSIS — I4891 Unspecified atrial fibrillation: Secondary | ICD-10-CM | POA: Diagnosis not present

## 2012-05-15 DIAGNOSIS — Z4801 Encounter for change or removal of surgical wound dressing: Secondary | ICD-10-CM | POA: Diagnosis not present

## 2012-05-18 DIAGNOSIS — I2510 Atherosclerotic heart disease of native coronary artery without angina pectoris: Secondary | ICD-10-CM | POA: Diagnosis not present

## 2012-05-18 DIAGNOSIS — I4891 Unspecified atrial fibrillation: Secondary | ICD-10-CM | POA: Diagnosis not present

## 2012-05-18 DIAGNOSIS — Z433 Encounter for attention to colostomy: Secondary | ICD-10-CM | POA: Diagnosis not present

## 2012-05-18 DIAGNOSIS — Z4801 Encounter for change or removal of surgical wound dressing: Secondary | ICD-10-CM | POA: Diagnosis not present

## 2012-05-18 DIAGNOSIS — D649 Anemia, unspecified: Secondary | ICD-10-CM | POA: Diagnosis not present

## 2012-05-23 ENCOUNTER — Inpatient Hospital Stay (HOSPITAL_COMMUNITY)
Admission: EM | Admit: 2012-05-23 | Discharge: 2012-05-24 | DRG: 490 | Disposition: A | Discharge status: Home / Self Care | Payer: Managed Care, Other (non HMO) | Attending: Neurological Surgery | Admitting: Neurological Surgery

## 2012-05-23 ENCOUNTER — Emergency Department (HOSPITAL_COMMUNITY): Payer: Managed Care, Other (non HMO) | Admitting: Anesthesiology

## 2012-05-23 ENCOUNTER — Encounter (HOSPITAL_COMMUNITY)
Admission: EM | Disposition: A | Discharge status: Home / Self Care | Payer: Self-pay | Source: Home / Self Care | Attending: Neurological Surgery

## 2012-05-23 ENCOUNTER — Encounter (HOSPITAL_COMMUNITY): Payer: Self-pay | Admitting: *Deleted

## 2012-05-23 ENCOUNTER — Encounter (HOSPITAL_COMMUNITY): Payer: Self-pay | Admitting: Anesthesiology

## 2012-05-23 ENCOUNTER — Emergency Department (HOSPITAL_COMMUNITY): Payer: Managed Care, Other (non HMO)

## 2012-05-23 DIAGNOSIS — I2510 Atherosclerotic heart disease of native coronary artery without angina pectoris: Secondary | ICD-10-CM | POA: Diagnosis present

## 2012-05-23 DIAGNOSIS — Z933 Colostomy status: Secondary | ICD-10-CM

## 2012-05-23 DIAGNOSIS — I10 Essential (primary) hypertension: Secondary | ICD-10-CM | POA: Diagnosis present

## 2012-05-23 DIAGNOSIS — G8220 Paraplegia, unspecified: Secondary | ICD-10-CM | POA: Diagnosis present

## 2012-05-23 DIAGNOSIS — I252 Old myocardial infarction: Secondary | ICD-10-CM

## 2012-05-23 DIAGNOSIS — Z79899 Other long term (current) drug therapy: Secondary | ICD-10-CM

## 2012-05-23 DIAGNOSIS — M48061 Spinal stenosis, lumbar region without neurogenic claudication: Secondary | ICD-10-CM | POA: Diagnosis not present

## 2012-05-23 DIAGNOSIS — I4891 Unspecified atrial fibrillation: Secondary | ICD-10-CM | POA: Diagnosis present

## 2012-05-23 DIAGNOSIS — M7130 Other bursal cyst, unspecified site: Secondary | ICD-10-CM | POA: Diagnosis present

## 2012-05-23 DIAGNOSIS — E785 Hyperlipidemia, unspecified: Secondary | ICD-10-CM | POA: Diagnosis present

## 2012-05-23 DIAGNOSIS — Z87891 Personal history of nicotine dependence: Secondary | ICD-10-CM

## 2012-05-23 HISTORY — PX: LAMINECTOMY: SHX219

## 2012-05-23 LAB — CBC WITH DIFFERENTIAL/PLATELET
Basophils Absolute: 0 10*3/uL (ref 0.0–0.1)
Basophils Relative: 0 % (ref 0–1)
HCT: 38.5 % — ABNORMAL LOW (ref 39.0–52.0)
Lymphocytes Relative: 7 % — ABNORMAL LOW (ref 12–46)
MCHC: 33.8 g/dL (ref 30.0–36.0)
Monocytes Absolute: 0.2 10*3/uL (ref 0.1–1.0)
Neutro Abs: 6 10*3/uL (ref 1.7–7.7)
Neutrophils Relative %: 90 % — ABNORMAL HIGH (ref 43–77)
Platelets: 129 10*3/uL — ABNORMAL LOW (ref 150–400)
RDW: 16.5 % — ABNORMAL HIGH (ref 11.5–15.5)
WBC: 6.6 10*3/uL (ref 4.0–10.5)

## 2012-05-23 LAB — URINALYSIS, ROUTINE W REFLEX MICROSCOPIC
Glucose, UA: NEGATIVE mg/dL
Ketones, ur: NEGATIVE mg/dL
Leukocytes, UA: NEGATIVE
Specific Gravity, Urine: 1.011 (ref 1.005–1.030)
pH: 7.5 (ref 5.0–8.0)

## 2012-05-23 LAB — LIPASE, BLOOD: Lipase: 25 U/L (ref 11–59)

## 2012-05-23 LAB — COMPREHENSIVE METABOLIC PANEL
ALT: 10 U/L (ref 0–53)
AST: 23 U/L (ref 0–37)
Albumin: 3.3 g/dL — ABNORMAL LOW (ref 3.5–5.2)
Alkaline Phosphatase: 82 U/L (ref 39–117)
CO2: 29 mEq/L (ref 19–32)
Chloride: 101 mEq/L (ref 96–112)
GFR calc non Af Amer: 84 mL/min — ABNORMAL LOW (ref >90)
Potassium: 3.7 mEq/L (ref 3.5–5.1)
Sodium: 140 mEq/L (ref 135–145)
Total Bilirubin: 1.3 mg/dL — ABNORMAL HIGH (ref 0.3–1.2)

## 2012-05-23 LAB — PROTIME-INR: INR: 1.04 (ref 0.00–1.49)

## 2012-05-23 SURGERY — LUMBAR LAMINECTOMY FOR TUMOR
Anesthesia: General | Site: Back | Wound class: Clean

## 2012-05-23 MED ORDER — 0.9 % SODIUM CHLORIDE (POUR BTL) OPTIME
TOPICAL | Status: DC | PRN
  Administered 2012-05-23: 1000 mL

## 2012-05-23 MED ORDER — NEOSTIGMINE METHYLSULFATE 1 MG/ML IJ SOLN
INTRAMUSCULAR | Status: DC | PRN
  Administered 2012-05-23: 3 mg via INTRAVENOUS

## 2012-05-23 MED ORDER — HYDRALAZINE HCL 20 MG/ML IJ SOLN
10.0000 mg | INTRAMUSCULAR | Status: DC | PRN
  Administered 2012-05-23: 10 mg via INTRAVENOUS
  Filled 2012-05-23: qty 1

## 2012-05-23 MED ORDER — ONDANSETRON HCL 4 MG/2ML IJ SOLN
INTRAMUSCULAR | Status: DC | PRN
  Administered 2012-05-23: 4 mg via INTRAVENOUS

## 2012-05-23 MED ORDER — KETOROLAC TROMETHAMINE 15 MG/ML IJ SOLN
15.0000 mg | Freq: Four times a day (QID) | INTRAMUSCULAR | Status: DC
  Administered 2012-05-23 – 2012-05-24 (×2): 15 mg via INTRAVENOUS
  Filled 2012-05-23 (×5): qty 1

## 2012-05-23 MED ORDER — SODIUM CHLORIDE 0.9 % IR SOLN
Status: DC | PRN
  Administered 2012-05-23: 19:00:00

## 2012-05-23 MED ORDER — LIDOCAINE-EPINEPHRINE 1 %-1:100000 IJ SOLN
INTRAMUSCULAR | Status: DC | PRN
  Administered 2012-05-23: 4.5 mL via INTRADERMAL

## 2012-05-23 MED ORDER — COLCHICINE 0.6 MG PO TABS
0.6000 mg | ORAL_TABLET | Freq: Every day | ORAL | Status: DC | PRN
  Filled 2012-05-23: qty 1

## 2012-05-23 MED ORDER — SODIUM CHLORIDE 0.9 % IJ SOLN: 3.0000 mL | Freq: Two times a day (BID) | INTRAMUSCULAR | Status: DC

## 2012-05-23 MED ORDER — METOPROLOL SUCCINATE ER 100 MG PO TB24
100.0000 mg | ORAL_TABLET | Freq: Every day | ORAL | Status: DC
  Administered 2012-05-24: 100 mg via ORAL
  Filled 2012-05-23 (×3): qty 1

## 2012-05-23 MED ORDER — SODIUM CHLORIDE 0.9 % IV SOLN: 250.0000 mL | INTRAVENOUS | Status: DC

## 2012-05-23 MED ORDER — CEFAZOLIN SODIUM 1-5 GM-% IV SOLN
1.0000 g | Freq: Three times a day (TID) | INTRAVENOUS | Status: AC
  Administered 2012-05-24 (×2): 1 g via INTRAVENOUS
  Filled 2012-05-23 (×2): qty 50

## 2012-05-23 MED ORDER — HYDROMORPHONE HCL PF 1 MG/ML IJ SOLN
0.2500 mg | INTRAMUSCULAR | Status: DC | PRN
  Administered 2012-05-23 (×4): 0.5 mg via INTRAVENOUS

## 2012-05-23 MED ORDER — MIDAZOLAM HCL 5 MG/5ML IJ SOLN
INTRAMUSCULAR | Status: DC | PRN
  Administered 2012-05-23: 2 mg via INTRAVENOUS

## 2012-05-23 MED ORDER — PHENOL 1.4 % MT LIQD: 1.0000 | OROMUCOSAL | Status: DC | PRN

## 2012-05-23 MED ORDER — CEFAZOLIN SODIUM-DEXTROSE 2-3 GM-% IV SOLR
INTRAVENOUS | Status: DC | PRN
  Administered 2012-05-23: 2 g via INTRAVENOUS

## 2012-05-23 MED ORDER — OXYCODONE-ACETAMINOPHEN 5-325 MG PO TABS
ORAL_TABLET | ORAL | Status: AC
  Filled 2012-05-23: qty 2

## 2012-05-23 MED ORDER — ALLOPURINOL 100 MG PO TABS
100.0000 mg | ORAL_TABLET | Freq: Every morning | ORAL | Status: DC
  Administered 2012-05-24: 100 mg via ORAL
  Filled 2012-05-23: qty 1

## 2012-05-23 MED ORDER — ACETAMINOPHEN 325 MG PO TABS: 650.0000 mg | ORAL_TABLET | ORAL | Status: DC | PRN

## 2012-05-23 MED ORDER — FENTANYL CITRATE 0.05 MG/ML IJ SOLN
INTRAMUSCULAR | Status: DC | PRN
  Administered 2012-05-23 (×2): 100 ug via INTRAVENOUS
  Administered 2012-05-23: 50 ug via INTRAVENOUS

## 2012-05-23 MED ORDER — ROCURONIUM BROMIDE 100 MG/10ML IV SOLN
INTRAVENOUS | Status: DC | PRN
  Administered 2012-05-23: 50 mg via INTRAVENOUS

## 2012-05-23 MED ORDER — ACETAMINOPHEN 650 MG RE SUPP: 650.0000 mg | RECTAL | Status: DC | PRN

## 2012-05-23 MED ORDER — KETOROLAC TROMETHAMINE 30 MG/ML IJ SOLN
INTRAMUSCULAR | Status: AC
  Filled 2012-05-23: qty 1

## 2012-05-23 MED ORDER — ATORVASTATIN CALCIUM 40 MG PO TABS: 40.0000 mg | ORAL_TABLET | ORAL | Status: DC

## 2012-05-23 MED ORDER — MENTHOL 3 MG MT LOZG: 1.0000 | LOZENGE | OROMUCOSAL | Status: DC | PRN

## 2012-05-23 MED ORDER — OXYCODONE HCL 5 MG PO TABS
5.0000 mg | ORAL_TABLET | Freq: Once | ORAL | Status: DC | PRN
Start: 2012-05-23 — End: 2012-05-23

## 2012-05-23 MED ORDER — THROMBIN 20000 UNITS EX KIT
PACK | CUTANEOUS | Status: DC | PRN
  Administered 2012-05-23: 19:00:00 via TOPICAL

## 2012-05-23 MED ORDER — DEXAMETHASONE SODIUM PHOSPHATE 10 MG/ML IJ SOLN
INTRAMUSCULAR | Status: DC | PRN
  Administered 2012-05-23: 10 mg via INTRAVENOUS

## 2012-05-23 MED ORDER — METHOCARBAMOL 500 MG PO TABS
ORAL_TABLET | ORAL | Status: AC
  Filled 2012-05-23: qty 1

## 2012-05-23 MED ORDER — FLUCONAZOLE 200 MG PO TABS: 200.0000 mg | ORAL_TABLET | ORAL | Status: DC

## 2012-05-23 MED ORDER — SODIUM CHLORIDE 0.9 % IV SOLN
INTRAVENOUS | Status: DC
  Administered 2012-05-23: 18:00:00 via INTRAVENOUS

## 2012-05-23 MED ORDER — METOCLOPRAMIDE HCL 5 MG/ML IJ SOLN
10.0000 mg | Freq: Once | INTRAMUSCULAR | Status: DC | PRN
  Filled 2012-05-23: qty 2

## 2012-05-23 MED ORDER — BUPIVACAINE HCL (PF) 0.5 % IJ SOLN
INTRAMUSCULAR | Status: DC | PRN
  Administered 2012-05-23: 4.5 mL

## 2012-05-23 MED ORDER — PROPOFOL 10 MG/ML IV BOLUS
INTRAVENOUS | Status: DC | PRN
  Administered 2012-05-23: 200 mg via INTRAVENOUS

## 2012-05-23 MED ORDER — SODIUM CHLORIDE 0.9 % IV SOLN
INTRAVENOUS | Status: DC | PRN
  Administered 2012-05-23 (×2): via INTRAVENOUS

## 2012-05-23 MED ORDER — OXYCODONE HCL 5 MG/5ML PO SOLN
5.0000 mg | Freq: Once | ORAL | Status: DC | PRN
Start: 2012-05-23 — End: 2012-05-23

## 2012-05-23 MED ORDER — OXYCODONE-ACETAMINOPHEN 5-325 MG PO TABS
1.0000 | ORAL_TABLET | ORAL | Status: DC | PRN
  Administered 2012-05-23: 2 via ORAL

## 2012-05-23 MED ORDER — MORPHINE SULFATE 2 MG/ML IJ SOLN: 1.0000 mg | INTRAMUSCULAR | Status: DC | PRN

## 2012-05-23 MED ORDER — SODIUM CHLORIDE 0.9 % IJ SOLN: 3.0000 mL | INTRAMUSCULAR | Status: DC | PRN

## 2012-05-23 MED ORDER — BACITRACIN 50000 UNITS IM SOLR
INTRAMUSCULAR | Status: AC
  Filled 2012-05-23: qty 1

## 2012-05-23 MED ORDER — ONDANSETRON HCL 4 MG/2ML IJ SOLN: 4.0000 mg | INTRAMUSCULAR | Status: DC | PRN

## 2012-05-23 MED ORDER — GLYCOPYRROLATE 0.2 MG/ML IJ SOLN
INTRAMUSCULAR | Status: DC | PRN
  Administered 2012-05-23: 0.4 mg via INTRAVENOUS

## 2012-05-23 MED ORDER — ALUM & MAG HYDROXIDE-SIMETH 200-200-20 MG/5ML PO SUSP: 30.0000 mL | Freq: Four times a day (QID) | ORAL | Status: DC | PRN

## 2012-05-23 MED ORDER — METHOCARBAMOL 100 MG/ML IJ SOLN: 500.0000 mg | Freq: Four times a day (QID) | INTRAVENOUS | Status: DC | PRN

## 2012-05-23 MED ORDER — PANTOPRAZOLE SODIUM 40 MG PO TBEC
80.0000 mg | DELAYED_RELEASE_TABLET | Freq: Every day | ORAL | Status: DC
  Administered 2012-05-23 – 2012-05-24 (×2): 80 mg via ORAL
  Filled 2012-05-23: qty 2
  Filled 2012-05-23: qty 1

## 2012-05-23 MED ORDER — KETOROLAC TROMETHAMINE 15 MG/ML IJ SOLN
INTRAMUSCULAR | Status: DC | PRN
  Administered 2012-05-23: 15 mg via INTRAVENOUS

## 2012-05-23 MED ORDER — METHOCARBAMOL 500 MG PO TABS
500.0000 mg | ORAL_TABLET | Freq: Four times a day (QID) | ORAL | Status: DC | PRN
  Administered 2012-05-23: 500 mg via ORAL

## 2012-05-23 MED ORDER — SODIUM CHLORIDE 0.9 % IV SOLN
INTRAVENOUS | Status: AC
  Filled 2012-05-23: qty 500

## 2012-05-23 SURGICAL SUPPLY — 32 items
BAG DECANTER FOR FLEXI CONT (MISCELLANEOUS) ×2 IMPLANT
BUR EGG ELITE 5.0 (BURR) ×2 IMPLANT
BUR MATCHSTICK NEURO 3.0X3.8 (BURR) ×2 IMPLANT
CLOTH BEACON ORANGE TIMEOUT ST (SAFETY) ×2 IMPLANT
CONT SPEC STER OR (MISCELLANEOUS) ×4 IMPLANT
CORDS BIPOLAR (ELECTRODE) ×2 IMPLANT
DERMABOND ADHESIVE PROPEN (GAUZE/BANDAGES/DRESSINGS) ×1
DERMABOND ADVANCED .7 DNX6 (GAUZE/BANDAGES/DRESSINGS) ×1 IMPLANT
DRAPE LAPAROTOMY T 102X78X121 (DRAPES) ×2 IMPLANT
DRAPE MICROSCOPE LEICA (MISCELLANEOUS) ×2 IMPLANT
DRAPE POUCH INSTRU U-SHP 10X18 (DRAPES) ×2 IMPLANT
DURAPREP 26ML APPLICATOR (WOUND CARE) ×2 IMPLANT
DURAPREP 6ML APPLICATOR 50/CS (WOUND CARE) ×2 IMPLANT
GLOVE ECLIPSE 8.5 STRL (GLOVE) ×2 IMPLANT
GLOVE INDICATOR 7.0 STRL GRN (GLOVE) ×2 IMPLANT
GLOVE OPTIFIT SS 6.5 STRL BRWN (GLOVE) ×4 IMPLANT
GOWN EXTRA PROTECTION XL (GOWNS) ×2 IMPLANT
GOWN STRL NON-REIN LRG LVL3 (GOWN DISPOSABLE) ×2 IMPLANT
KIT BASIN OR (CUSTOM PROCEDURE TRAY) ×2 IMPLANT
KIT ROOM TURNOVER OR (KITS) ×2 IMPLANT
NEEDLE HYPO 21X1.5 SAFETY (NEEDLE) ×2 IMPLANT
NEEDLE SPNL 18GX3.5 QUINCKE PK (NEEDLE) ×2 IMPLANT
PACK LAMINECTOMY NEURO (CUSTOM PROCEDURE TRAY) ×2 IMPLANT
RUBBERBAND STERILE (MISCELLANEOUS) ×4 IMPLANT
SUT VIC AB 0 CT1 27 (SUTURE) ×1
SUT VIC AB 0 CT1 27XBRD ANBCTR (SUTURE) ×1 IMPLANT
SUT VIC AB 2-0 CP2 18 (SUTURE) ×2 IMPLANT
SUT VIC AB 3-0 SH 18 (SUTURE) ×2 IMPLANT
SYR 20ML ECCENTRIC (SYRINGE) ×2 IMPLANT
SYR CONTROL 10ML LL (SYRINGE) ×2 IMPLANT
TOWEL OR 17X24 6PK STRL BLUE (TOWEL DISPOSABLE) ×2 IMPLANT
TOWEL OR 17X26 10 PK STRL BLUE (TOWEL DISPOSABLE) ×2 IMPLANT

## 2012-05-23 NOTE — Preoperative (Signed)
Beta Blockers   Reason not to administer Beta Blockers:Not Applicable 

## 2012-05-23 NOTE — H&P (Signed)
Wille Aubuchon. is an 72 y.o. male.   Chief Complaint: bilateral lower extremity weakness and pain with standing HPI: Asian is a 72 year old individual who noticed that about 3 days ago he started to develop some pain when standing. He found that his legs felt slightly weak and this rapidly progressed over last 2 days to the point where he couldn't walk without the use of a walker. In addition he notes that the pain has become severe and excruciating. He reported this to Dr. I. Was primary care physician who suspected the patient may have some arthritic process in the lumbar spine and he underwent treatment with oral prednisone. Despite this his symptoms rapidly progressed. He was seen in the urgent orthopedic clinic. 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  Patient is seen in the emergency department and on initial evaluation I note that he has substantial weakness particularly in his distal lower extremities 3-4/5 strength in tibialis anterior on the right and weakness in the tibialis anterior on the left. His bladder control has been intact. Is now to be admitted to undergo emergent surgical decompression of this lesion.  Past Medical History  Diagnosis Date  . Hyperglycemia   . CAD (coronary artery disease)   . Hyperlipidemia   . Chronic atrial fibrillation     Dr. Clifton James  . Anemia   . Gilbert's syndrome   . Hypertension   . Hemorrhoids   . Cataract     BILATERAL  . Renal insufficiency   . Myocardial infarction     history of. 1983  . AAA (abdominal aortic aneurysm)   . Duodenal ulcer   . History of GI bleed   . Heart murmur   . Heart attack   . History of blood transfusion   . Difficult intubation 2005    Past Surgical History  Procedure Laterality Date  . Cath/angioplasy      [id number], [facility] by Dr. Brett Canales [facility] of angioplasty)  . Carotid  endarterectomy      bilaterally 2007  . Tonsillectomy    . No colonoscopy to date      ("I don't have a good excuse"). SOC reviewed  . Cataract extraction    . Pyloric stenosis    . Esophagogastroduodenoscopy  04/06/2011    Procedure: ESOPHAGOGASTRODUODENOSCOPY (EGD);  Surgeon: Theda Belfast, MD;  Location: [facility];  Service: Endoscopy;  Laterality: N/A;  . Eye surgery  2011    bilateral  . Colostomy N/A 04/07/2012    Procedure: COLOSTOMY;  Surgeon: Mariella Saa, MD;  Location: [facility];  Service: General;  Laterality: N/A;  Sigmoid Colectomy Colostomy   . Partial colectomy N/A 04/07/2012    Procedure: PARTIAL COLECTOMY;  Surgeon: Mariella Saa, MD;  Location: [facility];  Service: General;  Laterality: N/A;  Sigmoid Colectomy Colostomy hartman procedure    Family History  Problem Relation Age of Onset  . Heart attack Father   . Heart disease Father   . Colon cancer Neg Hx    Social History:  reports that he quit smoking about 31 years ago. His smoking use included Cigarettes. He smoked 3.00 packs per day. He has never used smokeless tobacco. He reports that he drinks about 12.6 ounces of alcohol per week. He reports that he does not use illicit drugs.  Allergies: No Known Allergies   (Not in a  hospital admission)  Results for orders placed during the hospital encounter of 05/23/12 (from the past 48 hour(s))  CBC WITH DIFFERENTIAL     Status: Abnormal   Collection Time    05/23/12  3:06 PM      Result Value Range   WBC 6.6  4.0 - 10.5 K/uL   RBC 4.06 (*) 4.22 - 5.81 MIL/uL   Hemoglobin 13.0  13.0 - 17.0 g/dL   HCT 11.9 (*) 14.7 - 82.9 %   MCV 94.8  78.0 - 100.0 fL   MCH 32.0  26.0 - 34.0 pg   MCHC 33.8  30.0 - 36.0 g/dL   RDW 56.2 (*) 13.0 - 86.5 %   Platelets 129 (*) 150 - 400 K/uL   Neutrophils Relative 90 (*) 43 - 77 %   Neutro Abs 6.0  1.7 - 7.7 K/uL   Lymphocytes Relative 7 (*) 12 - 46 %   Lymphs Abs 0.5 (*) 0.7 - 4.0 K/uL   Monocytes Relative 2 (*) 3 -  12 %   Monocytes Absolute 0.2  0.1 - 1.0 K/uL   Eosinophils Relative 0  0 - 5 %   Eosinophils Absolute 0.0  0.0 - 0.7 K/uL   Basophils Relative 0  0 - 1 %   Basophils Absolute 0.0  0.0 - 0.1 K/uL  COMPREHENSIVE METABOLIC PANEL     Status: Abnormal   Collection Time    05/23/12  3:06 PM      Result Value Range   Sodium 140  135 - 145 mEq/L   Potassium 3.7  3.5 - 5.1 mEq/L   Chloride 101  96 - 112 mEq/L   CO2 29  19 - 32 mEq/L   Glucose, Bld 126 (*) 70 - 99 mg/dL   BUN 16  6 - 23 mg/dL   Creatinine, Ser 7.84  0.50 - 1.35 mg/dL   Calcium 9.3  8.4 - 69.6 mg/dL   Total Protein 6.9  6.0 - 8.3 g/dL   Albumin 3.3 (*) 3.5 - 5.2 g/dL   AST 23  0 - 37 U/L   ALT 10  0 - 53 U/L   Alkaline Phosphatase 82  39 - 117 U/L   Total Bilirubin 1.3 (*) 0.3 - 1.2 mg/dL   GFR calc non Af Amer 84 (*) >90 mL/min   GFR calc Af Amer >90  >90 mL/min   Comment:            The eGFR has been calculated     using the CKD EPI equation.     This calculation has not been     validated in all clinical     situations.     eGFR's persistently     <90 mL/min signify     possible Chronic Kidney Disease.  PROTIME-INR     Status: None   Collection Time    05/23/12  3:06 PM      Result Value Range   Prothrombin Time 13.5  11.6 - 15.2 seconds   INR 1.04  0.00 - 1.49  LIPASE, BLOOD     Status: None   Collection Time    05/23/12  3:06 PM      Result Value Range   Lipase 25  11 - 59 U/L  URINALYSIS, ROUTINE W REFLEX MICROSCOPIC     Status: None   Collection Time    05/23/12  4:18 PM      Result Value Range   Color, Urine YELLOW  YELLOW   APPearance CLEAR  CLEAR   Specific Gravity, Urine 1.011  1.005 - 1.030   pH 7.5  5.0 - 8.0   Glucose, UA NEGATIVE  NEGATIVE mg/dL   Hgb urine dipstick NEGATIVE  NEGATIVE   Bilirubin Urine NEGATIVE  NEGATIVE   Ketones, ur NEGATIVE  NEGATIVE mg/dL   Protein, ur NEGATIVE  NEGATIVE mg/dL   Urobilinogen, UA 0.2  0.0 - 1.0 mg/dL   Nitrite NEGATIVE  NEGATIVE   Leukocytes, UA  NEGATIVE  NEGATIVE   Comment: MICROSCOPIC NOT DONE ON URINES WITH NEGATIVE PROTEIN, BLOOD, LEUKOCYTES, NITRITE, OR GLUCOSE <1000 mg/dL.   No results found.  Review of Systems  Constitutional:       Weakness of both lower extremities  HENT: Negative.   Eyes: Negative.   Respiratory: Negative.   Cardiovascular:       History of heart attack in 1983  Gastrointestinal: Negative.        Recent colostomy performed 42 days ago. Secondary to diverticulitis  Genitourinary: Negative.   Musculoskeletal: Positive for falls.  Skin: Negative.   Neurological: Positive for focal weakness and weakness.  Endo/Heme/Allergies: Negative.   Psychiatric/Behavioral: Negative.     Blood pressure 199/99, pulse 82, temperature 98.1 F (36.7 C), temperature source Oral, resp. rate 10, SpO2 97.00%. Physical Exam  Constitutional: He is oriented to person, place, and time. He appears well-developed and well-nourished.  HENT:  Head: Normocephalic and atraumatic.  Eyes: Conjunctivae and EOM are normal. Pupils are equal, round, and reactive to light.  Neck: Normal range of motion. Neck supple.  Cardiovascular: Normal rate and regular rhythm.   Respiratory: Effort normal and breath sounds normal.  GI: Soft. Bowel sounds are normal.  Recent colostomy  Musculoskeletal:  Iliopsoas and quadriceps seem 5 out of 5 confrontational testing tibialis anterior is weaker on the right than on the left at 4 minus out of 5 on the right 4/5 on the left. Gastroc strength seems 4/5 bilaterally. Sensation is intact distally to pin and light touch. Deep tendon reflexes are trace in the patellae and trace in the Achilles 2+ in the biceps and triceps.  Neurological: He is alert and oriented to person, place, and time. He has normal reflexes. No cranial nerve deficit. Coordination normal.  Skin: Skin is warm and dry.  Psychiatric: He has a normal mood and affect. His behavior is normal. Judgment and thought content normal.      Assessment/Plan Stenosis L4-5 secondary to synovial cyst causing high-grade stenosis with rapid evolution of symptoms. These include weakness of both distal lower extremities in the Peronteus longus tibialis anterior and gastrocs.  Arminda Foglio J 05/23/2012, 5:32 PM

## 2012-05-23 NOTE — Anesthesia Preprocedure Evaluation (Addendum)
Anesthesia Evaluation  Patient identified by MRN, date of birth, ID band Patient awake    Reviewed: Allergy & Precautions, H&P , NPO status , Patient's Chart, lab work & pertinent test results, reviewed documented beta blocker date and time   History of Anesthesia Complications (+) DIFFICULT AIRWAY  Airway Mallampati: III TM Distance: >3 FB Neck ROM: full    Dental  (+) Teeth Intact and Dental Advisory Given   Pulmonary neg pulmonary ROS,  breath sounds clear to auscultation        Cardiovascular hypertension, On Medications and Pt. on home beta blockers + CAD, + Past MI and + Peripheral Vascular Disease + dysrhythmias Atrial Fibrillation + Valvular Problems/Murmurs Rhythm:irregular     Neuro/Psych  Neuromuscular disease negative psych ROS   GI/Hepatic negative GI ROS, Neg liver ROS, PUD,   Endo/Other  negative endocrine ROS  Renal/GU Renal disease  negative genitourinary   Musculoskeletal   Abdominal   Peds  Hematology  (+) anemia ,   Anesthesia Other Findings See surgeon's H&P - use of glidescope planned.  Reproductive/Obstetrics negative OB ROS                         Anesthesia Physical Anesthesia Plan  ASA: III and emergent  Anesthesia Plan: General   Post-op Pain Management:    Induction: Intravenous, Rapid sequence and Cricoid pressure planned  Airway Management Planned: Video Laryngoscope Planned and Oral ETT  Additional Equipment:   Intra-op Plan:   Post-operative Plan: Extubation in OR  Informed Consent: I have reviewed the patients History and Physical, chart, labs and discussed the procedure including the risks, benefits and alternatives for the proposed anesthesia with the patient or authorized representative who has indicated his/her understanding and acceptance.   Dental Advisory Given and Dental advisory given  Plan Discussed with: CRNA and Surgeon  Anesthesia Plan  Comments:        Anesthesia Quick Evaluation

## 2012-05-23 NOTE — ED Notes (Addendum)
Pt reports that he began having difficulty ambulating on wed, went to pcp and was started on prednisone with no relief. Went to see dr Thurston Hole today and had MRI done, was told that he has large cyst pressing down on a nerve, causing pain in his legs and difficulty ambulating. Also has leaking colostomy bag. Dr elsner needs to be paged on arrival

## 2012-05-23 NOTE — ED Provider Notes (Signed)
History     CSN: [id number]  Arrival date & time 05/23/12  1412   First MD Initiated Contact with Patient 05/23/12 1458      Chief Complaint  Patient presents with  . Leg Pain    (Consider location/radiation/quality/duration/timing/severity/associated sxs/prior treatment) HPI Comments: Bilateral leg pain with difficulty walking and pain with movement for the past 3 days. Had outpatient MRI today which showed "cyst pushing I nerve". Denies pain at rest. Denies any back pain. Denies any fever, vomiting, bowel or bladder incontinence. History remarkable for A. fib of Robaxin which has not taken in a month. Had colostomy 6 weeks ago for diverticulitis. Denies any previous back problems. Denies any difficulty with urination. The by mouth intake and urine output. Denies any weakness in the leg denies any numbness or tingling. States he has pain with ambulation and pain with movement of his back and spine.  The history is provided by the patient and the spouse.    Past Medical History  Diagnosis Date  . Hyperglycemia   . CAD (coronary artery disease)   . Hyperlipidemia   . Chronic atrial fibrillation     Dr. Clifton James  . Anemia   . Gilbert's syndrome   . Hypertension   . Hemorrhoids   . Cataract     BILATERAL  . Renal insufficiency   . Myocardial infarction     history of. 1983  . AAA (abdominal aortic aneurysm)   . Duodenal ulcer   . History of GI bleed   . Heart murmur   . Heart attack   . History of blood transfusion   . Difficult intubation 2005    Past Surgical History  Procedure Laterality Date  . Cath/angioplasy      [id number], [facility] by Dr. Brett Canales [facility] of angioplasty)  . Carotid endarterectomy      bilaterally 2007  . Tonsillectomy    . No colonoscopy to date      ("I don't have a good excuse"). SOC reviewed  . Cataract extraction    . Pyloric stenosis    . Esophagogastroduodenoscopy  04/06/2011    Procedure: ESOPHAGOGASTRODUODENOSCOPY (EGD);  Surgeon:  Theda Belfast, MD;  Location: [facility];  Service: Endoscopy;  Laterality: N/A;  . Eye surgery  2011    bilateral  . Colostomy N/A 04/07/2012    Procedure: COLOSTOMY;  Surgeon: Mariella Saa, MD;  Location: [facility];  Service: General;  Laterality: N/A;  Sigmoid Colectomy Colostomy   . Partial colectomy N/A 04/07/2012    Procedure: PARTIAL COLECTOMY;  Surgeon: Mariella Saa, MD;  Location: [facility];  Service: General;  Laterality: N/A;  Sigmoid Colectomy Colostomy hartman procedure    Family History  Problem Relation Age of Onset  . Heart attack Father   . Heart disease Father   . Colon cancer Neg Hx     History  Substance Use Topics  . Smoking status: Former Smoker -- 3.00 packs/day    Types: Cigarettes    Quit date: 02/11/1981  . Smokeless tobacco: Never Used  . Alcohol Use: 12.6 oz/week    21 Shots of liquor per week     Comment: daily      Review of Systems  Constitutional: Negative for activity change and appetite change.  HENT: Negative for congestion.   Respiratory: Negative for cough, chest tightness and shortness of breath.   Cardiovascular: Negative for chest pain.  Gastrointestinal: Negative for nausea, vomiting and abdominal pain.  Genitourinary: Negative for dysuria and hematuria.  Musculoskeletal: Positive for myalgias, arthralgias and gait problem. Negative for back pain.  Skin: Negative for wound.  Neurological: Negative for dizziness, weakness and headaches.   A complete 10 system review of systems was obtained and all systems are negative except as noted in the HPI and PMH.   Allergies  Review of patient's allergies indicates no known allergies.  Home Medications   Current Outpatient Rx  Name  Route  Sig  Dispense  Refill  . allopurinol (ZYLOPRIM) 100 MG tablet   Oral   Take 100 mg by mouth every morning.          . colchicine 0.6 MG tablet   Oral   Take 0.6 mg by mouth daily as needed (For gout.).          Marland Kitchen ferrous sulfate 325  (65 FE) MG tablet   Oral   Take 325 mg by mouth every morning.          . fluconazole (DIFLUCAN) 200 MG tablet               . metoprolol succinate (TOPROL-XL) 100 MG 24 hr tablet   Oral   Take 100 mg by mouth daily after breakfast.          . NULYTELY WITH FLAVOR PACKS 420 G solution               . omeprazole (PRILOSEC) 40 MG capsule   Oral   Take 40 mg by mouth daily.         . rosuvastatin (CRESTOR) 20 MG tablet   Oral   Take 20 mg by mouth 2 (two) times a week. He takes on Monday and Friday.         Marland Kitchen VITAMIN D, CHOLECALCIFEROL, PO   Oral   Take 1 tablet by mouth daily.           BP 199/99  Pulse 82  Temp(Src) 98.1 F (36.7 C) (Oral)  Resp 10  SpO2 97%  Physical Exam  Constitutional: He is oriented to person, place, and time. He appears well-developed and well-nourished.  HENT:  Head: Normocephalic and atraumatic.  Mouth/Throat: Oropharynx is clear and moist.  Eyes: Conjunctivae and EOM are normal. Pupils are equal, round, and reactive to light.  Neck: Normal range of motion. Neck supple.  Cardiovascular: Normal rate, regular rhythm and normal heart sounds.   No murmur heard. Pulmonary/Chest: Effort normal and breath sounds normal. No respiratory distress.  Abdominal: Soft. There is no tenderness. There is no rebound and no guarding.  LLQ colostomy with gas and stool  Musculoskeletal: Normal range of motion. He exhibits no edema and no tenderness.  Neurological: He is alert and oriented to person, place, and time. No cranial nerve deficit. He exhibits normal muscle tone. Coordination normal.  5/5 strength in bilateral promixal lower extremities, 4/5 strength in bilateral distal lower extremities. Ankle plantar and dorsiflexion intact but . Great toe extension intact bilaterally. +2 DP and PT pulses. Unable to elicit patellar reflexes bilaterally  Skin: Skin is warm.    ED Course  Procedures (including critical care time)  Labs Reviewed   CBC WITH DIFFERENTIAL - Abnormal; Notable for the following:    RBC 4.06 (*)    HCT 38.5 (*)    RDW 16.5 (*)    Platelets 129 (*)    Neutrophils Relative 90 (*)    Lymphocytes Relative 7 (*)    Lymphs Abs 0.5 (*)    Monocytes Relative 2 (*)  All other components within normal limits  COMPREHENSIVE METABOLIC PANEL - Abnormal; Notable for the following:    Glucose, Bld 126 (*)    Albumin 3.3 (*)    Total Bilirubin 1.3 (*)    GFR calc non Af Amer 84 (*)    All other components within normal limits  URINALYSIS, ROUTINE W REFLEX MICROSCOPIC  PROTIME-INR  LIPASE, BLOOD   No results found.   1. Lumbar stenosis   2. Synovial cyst       MDM   three-day history of bilateral leg pain worse on ambulation. Outpatient MRI showing cysts compressing nerve root. No focal deficits on exam now. No pain at rest. No back pain.  D/w Dr. Danielle Dess.  He has seen patient and will admit for decompression.    Date: 05/23/2012  Rate: 76  Rhythm: atrial fibrillation  QRS Axis: normal  Intervals: normal  ST/T Wave abnormalities: normal  Conduction Disutrbances:none  Narrative Interpretation:   Old EKG Reviewed: unchanged    Glynn Octave, MD 05/23/12 1824

## 2012-05-23 NOTE — Anesthesia Procedure Notes (Signed)
Procedure Name: Intubation Date/Time: 05/23/2012 6:35 PM Performed by: Kazzandra Desaulniers S Pre-anesthesia Checklist: Patient identified, Timeout performed, Emergency Drugs available, Suction available and Patient being monitored Patient Re-evaluated:Patient Re-evaluated prior to inductionOxygen Delivery Method: Circle system utilized Preoxygenation: Pre-oxygenation with 100% oxygen Intubation Type: IV induction Ventilation: Mask ventilation without difficulty Tube type: Oral Tube size: 7.5 mm Number of attempts: 1 Airway Equipment and Method: Video-laryngoscopy and Stylet Comments: Difficult airway known - glidescope planned

## 2012-05-23 NOTE — Op Note (Signed)
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 Anesthesia: Gen. endotracheal Indications: The patient is a 72 year old individual who has not had back problems until 3 days ago when he started developed weakness in his lower extremities this was associated with back pain on standing and became progressively worse. After the first day he contacted his primary care physician who recommended a steroid dose course. Despite the oral prednisone that he is taking in total of 60 mg a day his symptoms worsened rapidly to the point where he could not stand or walk without the use of walker and even then with great difficulty. Today he presented to an urgent clinic for orthopedic care and an MRI of the lumbar spine was completed which demonstrated a large cystic structure at L4-L5 compressing his spinal canal. He is now taken to the operating room urgently to undergo surgical decompression.  Procedure: The patient was brought to the operating room supine on a stretcher after the smooth induction of general endotracheal anesthesia he was turned prone the back was prepped with alcohol and DuraPrep and draped in a sterile fashion care was taken to pad the area around her recently place, colostomy. A midline incision was created after we localized area of L4-L5 using a spinal needle the dissection was carried down to the lumbar dorsal fascia which was opened on left side of the midline and a subperiosteal dissection was performed at L4-L5. A self-retaining retractor was placed in the wound. The inferior margin lamina of L4 out to the medial wall the facet and the thickened and redundant yellow  ligament in this area was taken up. As the laminotomy was created and the epidural space was reached and there was noted to be a cystic structure with a thin veil of translucent cyst wall that was encountered. The fluid inside the cyst was clear yellowish. As the cyst was manipulated it ruptured. He released a thick viscous fluid which was removed. The walls of the cyst were then collapsed and a series of curettes were used to remove the cyst wall from the undersurface of the lamina more superiorly. The cyst was not very firmly attached to the dura and stripped easily from the dural surface. All walls and remnants of the cyst were removed area was irrigated copiously. Portions of the cyst wall along with some of ligamentous tissue was sent for pathologic examination. The removal of the cyst was performed under the operating microscope using microdissection technique with visualization as best possible that could be afforded with the microscope under the edge of the inferior lamina at L4. With the decompression being completed hemostasis was carefully achieved in the soft tissues, the microscope was removed, and the lumbar dorsal fascia was closed with 0 Vicryl in an interrupted fashion. 10 cc of half percent Marcaine was injected into the paraspinous fascia. 2-0 Vicryl was used in the subcutaneous tissues and 3-0 Vicryl was used to close the subcuticular skin. Dermabond was placed on the skin. Blood loss for the procedure was nil.

## 2012-05-23 NOTE — Transfer of Care (Signed)
Immediate Anesthesia Transfer of Care Note  Patient: [name].  Procedure(s) Performed: Procedure(s): LUMBAR four-five LAMINECTOMY  (N/A)  Patient Location: PACU  Anesthesia Type:General  Level of Consciousness: awake, alert , oriented and patient cooperative  Airway & Oxygen Therapy: Patient Spontanous Breathing and Patient connected to nasal cannula oxygen  Post-op Assessment: Report given to PACU RN, Post -op Vital signs reviewed and stable and Patient moving all extremities X 4  Post vital signs: Reviewed and stable  Complications: No apparent anesthesia complications

## 2012-05-23 NOTE — Progress Notes (Signed)
Patient arrived from PACU. BP 220/117. Rechecked after patient settled manually 205/100. Dr. Danielle Dess notified. Order received and placed. Will continue to monitor.

## 2012-05-23 NOTE — Anesthesia Postprocedure Evaluation (Signed)
Anesthesia Post Note  Patient: [name].  Procedure(s) Performed: Procedure(s) (LRB): LUMBAR four-five LAMINECTOMY  (N/A)  Anesthesia type: General  Patient location: PACU  Post pain: Pain level controlled  Post assessment: Patient's Cardiovascular Status Stable  Last Vitals:  Filed Vitals:   05/23/12 2015  BP: 177/87  Pulse: 60  Temp:   Resp: 13    Post vital signs: Reviewed and stable  Level of consciousness: alert  Complications: No apparent anesthesia complications

## 2012-05-24 MED ORDER — HYDROCODONE-ACETAMINOPHEN 5-325 MG PO TABS: 1.0000 | ORAL_TABLET | Freq: Four times a day (QID) | ORAL | Status: DC | PRN

## 2012-05-24 NOTE — Progress Notes (Signed)
Patient voided 75ml this AM. Bladder scan revealed on . Fluids encouraged. Will continue to monitor. Patient was also able to stand up and walk to hall and back to bed with very little discomfort. Stated his legs felt much better from before surgery.

## 2012-05-24 NOTE — Plan of Care (Signed)
Problem: Consults Goal: Diagnosis - Spinal Surgery Lumbar Laminectomy (Complex)     

## 2012-05-24 NOTE — Progress Notes (Signed)
Patient's wife requesting colostomy bag to be changed as it was leaking prior to surgery. New bag requested from [facility], but they did not have the patient's correct size. Patient stated his bag was fine now and he would ask his wife to bring a new one from home in the morning.

## 2012-05-24 NOTE — Progress Notes (Signed)
Subjective: Patient reports Offers no complaints leg strength not quite up to par but pain is gone.  Objective: Vital signs in last 24 hours: Temp:  [97.3 F (36.3 C)-99.5 F (37.5 C)] 97.7 F (36.5 C) (04/13 0600) Pulse Rate:  [55-88] 73 (04/13 0600) Resp:  [10-20] 18 (04/13 0600) BP: (116-220)/(57-117) 149/79 mmHg (04/13 0600) SpO2:  [96 %-100 %] 96 % (04/13 0600) Weight:  [79.788 kg (175 lb 14.4 oz)] 79.788 kg (175 lb 14.4 oz) (04/13 0500)  Intake/Output from previous day: 04/12 0701 - 04/13 0700 In: 1050 [I.V.:1000; IV Piggyback:50] Out: 325 [Urine:325] Intake/Output this shift: Total I/O In: -  Out: 100 [Urine:100]  Motor strength 4+ out of 5 as noted by patient's ability to ambulate. Incision is clean and dry.  Lab Results:  Recent Labs  05/23/12 1506  WBC 6.6  HGB 13.0  HCT 38.5*  PLT 129*   BMET  Recent Labs  05/23/12 1506  NA 140  K 3.7  CL 101  CO2 29  GLUCOSE 126*  BUN 16  CREATININE 0.87  CALCIUM 9.3    Studies/Results: Dg Lumbar Spine 1 View  05/23/2012  *RADIOLOGY REPORT*  Clinical Data: 72 year old male undergoing lumbar surgery.  LUMBAR SPINE - 1 VIEW  Comparison: None.  Findings: Portable cross-table lateral intraoperative view of the lumbar spine labeled film #1 at 1912 hours.   Normal lumbar segmentation assumed, with evidence of ribs at what would be designated the T12 level.  Surgical probe directed at the L4-L5 disc space.  IMPRESSION: Intraoperative localization at L4-L5, assuming normal lumbar segmentation.   Original Report Authenticated By: Erskine Speed, M.D.     Assessment/Plan: Stable postop  LOS: 1 day  Discharge   Maeghan Canny J 05/24/2012, 10:13 AM

## 2012-05-24 NOTE — Progress Notes (Signed)
Retro ur review 

## 2012-05-24 NOTE — Progress Notes (Signed)
Patient foley catheter d/c per patient's request at 2300. Pt educated on needing to void within 6-8 hours. Fluids encouraged. Will continue to monitor.

## 2012-05-24 NOTE — Discharge Summary (Signed)
Physician Discharge Summary  Patient ID: [name]. MRN: [id number] DOB/AGE: Aug 01, 1940 72 y.o.  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 Synovial cyst L4-L5.  Her this. Principal Problem:   Spinal stenosis, lumbarwith synovial cyst Active Problems:   Paraparesis of both lower limbs   Discharged Condition: good  Hospital Course: Patient was admitted with bilateral leg weakness and severe pain and weakness on walking. His found to have a large central synovial cyst L4-L5. He was advised regarding surgery which relieved his symptoms promptly. He is being discharged at the current time.  Consults: None  Significant Diagnostic Studies: None here. MRI lumbar as an outpatient  Treatments: surgery: Laminotomy L4-L5 with decompression of synovial cyst operating microscope  Discharge Exam: Blood pressure 149/79, pulse 73, temperature 97.7 F (36.5 C), temperature source Oral, resp. rate 18, height 5\' 8"  (1.727 m), weight 79.788 kg (175 lb 14.4 oz), SpO2 96.00%. Incision is clean and dry motor function in distal lower extremities 4+ out of 5  Disposition: Discharge home  Discharge Orders   Future Appointments Provider Department Dept Phone   06/03/2012 4:15 PM Kathleene Hazel, MD [facility] Main Office [place]) [phone]   06/12/2012 2:30 PM Mariella Saa, MD [facility] Surgery, [place] [phone]   12/14/2012 11:30 AM Krista Blue [facility] MEDICAL ONCOLOGY [phone]   12/21/2012 12:00 PM Levert Feinstein, MD [facility] MEDICAL ONCOLOGY [phone]   Future Orders Complete By Expires     Call MD for:  redness, tenderness, or signs of infection (pain, swelling, redness, odor or green/yellow discharge around incision site)  As directed     Call MD for:  severe  uncontrolled pain  As directed     Call MD for:  temperature >100.4  As directed     Diet - low sodium heart healthy  As directed     Discharge instructions  As directed     Comments:      Okay to shower. Do not apply salves or appointments to incision. No heavy lifting with the upper extremities greater than 15 pounds. May resume driving when not requiring pain medication and patient feels comfortable with doing so.    Increase activity slowly  As directed         Medication List    TAKE these medications       allopurinol 100 MG tablet  Commonly known as:  ZYLOPRIM  Take 100 mg by mouth every morning.     colchicine 0.6 MG tablet  Take 0.6 mg by mouth daily as needed (For gout.).     ferrous sulfate 325 (65 FE) MG tablet  Take 325 mg by mouth every morning.     fluconazole 200 MG tablet  Commonly known as:  DIFLUCAN     HYDROcodone-acetaminophen 5-325 MG per tablet  Commonly known as:  NORCO  Take 1 tablet by mouth every 6 (six) hours as needed for pain.     metoprolol succinate 100 MG 24 hr tablet  Commonly known as:  TOPROL-XL  Take 100 mg by mouth daily after breakfast.     NULYTELY WITH FLAVOR PACKS 420 G solution  Generic drug:  polyethylene glycol-electrolytes     omeprazole 40 MG capsule  Commonly known as:  PRILOSEC  Take 40 mg by mouth daily.     rosuvastatin 20 MG tablet  Commonly  known as:  CRESTOR  Take 20 mg by mouth 2 (two) times a week. He takes on Monday and Friday.     VITAMIN D (CHOLECALCIFEROL) PO  Take 1 tablet by mouth daily.         SignedStefani Dama 05/24/2012, 10:16 AM

## 2012-05-25 DIAGNOSIS — D649 Anemia, unspecified: Secondary | ICD-10-CM | POA: Diagnosis not present

## 2012-05-25 DIAGNOSIS — Z433 Encounter for attention to colostomy: Secondary | ICD-10-CM | POA: Diagnosis not present

## 2012-05-25 DIAGNOSIS — I2510 Atherosclerotic heart disease of native coronary artery without angina pectoris: Secondary | ICD-10-CM | POA: Diagnosis not present

## 2012-05-25 DIAGNOSIS — I4891 Unspecified atrial fibrillation: Secondary | ICD-10-CM | POA: Diagnosis not present

## 2012-05-25 DIAGNOSIS — Z4801 Encounter for change or removal of surgical wound dressing: Secondary | ICD-10-CM | POA: Diagnosis not present

## 2012-05-26 ENCOUNTER — Encounter (HOSPITAL_COMMUNITY): Payer: Self-pay | Admitting: Neurological Surgery

## 2012-05-27 DIAGNOSIS — Z4801 Encounter for change or removal of surgical wound dressing: Secondary | ICD-10-CM | POA: Diagnosis not present

## 2012-05-27 DIAGNOSIS — Z433 Encounter for attention to colostomy: Secondary | ICD-10-CM | POA: Diagnosis not present

## 2012-05-27 DIAGNOSIS — D649 Anemia, unspecified: Secondary | ICD-10-CM | POA: Diagnosis not present

## 2012-05-27 DIAGNOSIS — I2510 Atherosclerotic heart disease of native coronary artery without angina pectoris: Secondary | ICD-10-CM | POA: Diagnosis not present

## 2012-05-27 DIAGNOSIS — I4891 Unspecified atrial fibrillation: Secondary | ICD-10-CM | POA: Diagnosis not present

## 2012-05-29 DIAGNOSIS — I4891 Unspecified atrial fibrillation: Secondary | ICD-10-CM | POA: Diagnosis not present

## 2012-05-29 DIAGNOSIS — D649 Anemia, unspecified: Secondary | ICD-10-CM | POA: Diagnosis not present

## 2012-05-29 DIAGNOSIS — I2510 Atherosclerotic heart disease of native coronary artery without angina pectoris: Secondary | ICD-10-CM | POA: Diagnosis not present

## 2012-05-29 DIAGNOSIS — Z433 Encounter for attention to colostomy: Secondary | ICD-10-CM | POA: Diagnosis not present

## 2012-05-29 DIAGNOSIS — Z4801 Encounter for change or removal of surgical wound dressing: Secondary | ICD-10-CM | POA: Diagnosis not present

## 2012-06-01 DIAGNOSIS — Z4801 Encounter for change or removal of surgical wound dressing: Secondary | ICD-10-CM | POA: Diagnosis not present

## 2012-06-01 DIAGNOSIS — Z433 Encounter for attention to colostomy: Secondary | ICD-10-CM | POA: Diagnosis not present

## 2012-06-01 DIAGNOSIS — I4891 Unspecified atrial fibrillation: Secondary | ICD-10-CM | POA: Diagnosis not present

## 2012-06-01 DIAGNOSIS — D649 Anemia, unspecified: Secondary | ICD-10-CM | POA: Diagnosis not present

## 2012-06-01 DIAGNOSIS — I2510 Atherosclerotic heart disease of native coronary artery without angina pectoris: Secondary | ICD-10-CM | POA: Diagnosis not present

## 2012-06-03 ENCOUNTER — Encounter: Payer: Self-pay | Admitting: Cardiovascular Disease

## 2012-06-03 ENCOUNTER — Ambulatory Visit (INDEPENDENT_AMBULATORY_CARE_PROVIDER_SITE_OTHER): Payer: Managed Care, Other (non HMO) | Admitting: Cardiovascular Disease

## 2012-06-03 VITALS — BP 188/92 | HR 91 | Ht 68.0 in | Wt 166.0 lb

## 2012-06-03 DIAGNOSIS — I4891 Unspecified atrial fibrillation: Secondary | ICD-10-CM

## 2012-06-03 DIAGNOSIS — I2510 Atherosclerotic heart disease of native coronary artery without angina pectoris: Secondary | ICD-10-CM

## 2012-06-03 NOTE — Patient Instructions (Addendum)
Your physician wants you to follow-up in:  6 months. You will receive a reminder letter in the mail two months in advance. If you don't receive a letter, please call our office to schedule the follow-up appointment.   

## 2012-06-03 NOTE — Progress Notes (Signed)
History of Present Illness: [name] is a pleasant 72 year old Caucasian male with a past medical history significant for atrial  fibrillation, hypertension, hyperlipidemia, coronary artery disease status post MI, and balloon angioplasty in 1980s, as well as peripheral vascular disease with bilateral carotid endarterectomies in 2005, who is here today for cardiac followup. He had been on coumadin in the past but he stopped taking because of a rash and did not wish to restart. He has refused cardioversion or ablation in the past. He has never been aware of his irregular heart rhythm. He has also refused to escalate his medical therapy with antiarrythmic drugs. I started Pradaxa September 2012. I offered him carotid dopplers at the last appt but he refused and wished to follow up with Dr. Hart Rochester. He was admitted to the GI service on 04/05/2011 with an acute upper GI bleed. He underwent upper endoscopy with Dr. Elnoria Howard the following morning and was found to have multiple shallow clean-based duodenal ulcers felt to be NSAID-induced. He did not require any endoscopic therapy. His hemoglobin did drift to the 7 range and he was transfused 2 units of packed RBCs. Pradaxa was held. He was started on PPI BID. He did well and was discharged on 04/08/11 with plans for PPI BID for three weeks then daily. He began to note weight loss in October 2013 and was found to have a diverticular abscess requiring colectomy 04/07/12. 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 Dr. Danielle Dess performed laminotomy with removal of cyst on 05/23/12.   He is here today for follow up. No dizziness, chest pain or SOB. Still weak but legs getting stronger.   Primary Care Physician: Dr. Felipa Eth  Last Lipid Profile:Lipid Panel     Component Value Date/Time   CHOL 134 04/16/2011 0923   TRIG 83.0 04/16/2011 0923   HDL 53.00 04/16/2011 0923   CHOLHDL 3 04/16/2011 0923   VLDL  16.6 04/16/2011 0923   LDLCALC 64 04/16/2011 0923     Past Medical History  Diagnosis Date  . Hyperglycemia   . CAD (coronary artery disease)   . Hyperlipidemia   . Chronic atrial fibrillation     Dr. Clifton James  . Anemia   . Gilbert's syndrome   . Hypertension   . Hemorrhoids   . Cataract     BILATERAL  . Renal insufficiency   . Myocardial infarction     history of. 1983  . AAA (abdominal aortic aneurysm)   . Duodenal ulcer   . History of GI bleed   . Heart murmur   . Heart attack   . History of blood transfusion   . Difficult intubation 2005    Past Surgical History  Procedure Laterality Date  . Cath/angioplasy      [id number], [place] by Dr. Brett Canales [facility] of angioplasty)  . Carotid endarterectomy      bilaterally 2007  . Tonsillectomy    . No colonoscopy to date      ("I don't have a good excuse"). SOC reviewed  . Cataract extraction    . Pyloric stenosis    . Esophagogastroduodenoscopy  04/06/2011    Procedure: ESOPHAGOGASTRODUODENOSCOPY (EGD);  Surgeon: Theda Belfast, MD;  Location: [facility];  Service: Endoscopy;  Laterality: N/A;  . Eye surgery  2011    bilateral  . Colostomy N/A 04/07/2012    Procedure: COLOSTOMY;  Surgeon: Mariella Saa, MD;  Location: [facility] OR[facility];  Service: General;  Laterality: N/A;  Sigmoid Colectomy Colostomy   . Partial colectomy N/A 04/07/2012    Procedure: PARTIAL COLECTOMY;  Surgeon: Mariella Saa, MD;  Location: [facility] ORS;  Service: General;  Laterality: N/A;  Sigmoid Colectomy Colostomy hartman procedure  . Laminectomy N/A 05/23/2012    Procedure: LUMBAR four-five LAMINECTOMY ;  Surgeon: Barnett Abu, MD;  Location: [facility];  Service: Neurosurgery;  Laterality: N/A;    Current Outpatient Prescriptions  Medication Sig Dispense Refill  . allopurinol (ZYLOPRIM) 100 MG tablet Take 100 mg by mouth every morning.       Marland Kitchen amLODipine (NORVASC) 5 MG tablet Take 5 mg by mouth daily.       . colchicine 0.6 MG tablet Take 0.6 mg by  mouth daily as needed (For gout.).       Marland Kitchen ferrous sulfate 325 (65 FE) MG tablet Take 325 mg by mouth every morning.       . fluconazole (DIFLUCAN) 200 MG tablet       . HYDROcodone-acetaminophen (NORCO) 5-325 MG per tablet Take 1 tablet by mouth every 6 (six) hours as needed for pain.  30 tablet  0  . metoprolol succinate (TOPROL-XL) 100 MG 24 hr tablet Take 100 mg by mouth daily after breakfast.       . NULYTELY WITH FLAVOR PACKS 420 G solution       . omeprazole (PRILOSEC) 40 MG capsule Take 40 mg by mouth daily.      Marland Kitchen oxyCODONE-acetaminophen (PERCOCET/ROXICET) 5-325 MG per tablet If needed      . rosuvastatin (CRESTOR) 20 MG tablet Take 20 mg by mouth 2 (two) times a week. He takes on Monday and Friday.      Marland Kitchen VITAMIN D, CHOLECALCIFEROL, PO Take 1 tablet by mouth daily.       No current facility-administered medications for this visit.    Allergies  Allergen Reactions  . Eggs Or Egg-Derived Products Nausea And Vomiting    History   Social History  . Marital Status: Married    Spouse Name: N/A    Number of Children: 3  . Years of Education: N/A   Occupational History  . Stockbroker    Social History Main Topics  . Smoking status: Former Smoker -- 3.00 packs/day    Types: Cigarettes    Quit date: 02/11/1981  . Smokeless tobacco: Never Used  . Alcohol Use: 12.6 oz/week    21 Shots of liquor per week     Comment: daily  . Drug Use: No  . Sexually Active: Not Currently   Other Topics Concern  . Not on file   Social History Narrative  . No narrative on file    Family History  Problem Relation Age of Onset  . Heart attack Father   . Heart disease Father   . Colon cancer Neg Hx     Review of Systems:  As stated in the HPI and otherwise negative.   BP 188/92  Pulse 91  Ht 5\' 8"  (1.727 m)  Wt 166 lb (75.297 kg)  BMI 25.25 kg/m2  Physical Examination: General: Well developed, well nourished, NAD HEENT: OP clear, mucus membranes moist SKIN: warm, dry. No  rashes. Neuro: No focal deficits Musculoskeletal: Muscle strength 5/5 all ext Psychiatric: Mood and affect normal Neck: No JVD, no carotid bruits, no thyromegaly, no lymphadenopathy. Lungs:Clear bilaterally, no wheezes, rhonci, crackles Cardiovascular: Irregular, irregular. No murmurs, gallops or rubs. Abdomen:Soft. Bowel sounds present. Non-tender.  Extremities: No lower  extremity edema. Pulses are 2 + in the bilateral DP/PT.  EKG: May 25, 2012: Atrial fibrillation, rate 76 bpm.   Assessment and Plan:   1. CORONARY ARTERY DISEASE: Stable. No changes.   2. ATRIAL FIBRILLATION: He has chronic, persistent atrial fibrillation. Will continue Toprol for rate control. He has been off of his Pradaxa since before his GI surgery. I have reviewed the risk of CVA with him and he understands that he would benefit from long term anticoagulation. Will try to communicate with Dr. Danielle Dess tomorrow to discuss when it would be safe to resume from a standpoint of his recent spinal surgery.   3. Carotid artery disease:  Bilateral carotid endarterectomy in 2007. He does not wish to have screening carotid dopplers.   4. HTN: BP is elevated. He had Norvasc added last week by Dr. Felipa Eth. He has f/u in am with Dr. Felipa Eth.

## 2012-06-05 DIAGNOSIS — Z433 Encounter for attention to colostomy: Secondary | ICD-10-CM | POA: Diagnosis not present

## 2012-06-05 DIAGNOSIS — I2510 Atherosclerotic heart disease of native coronary artery without angina pectoris: Secondary | ICD-10-CM | POA: Diagnosis not present

## 2012-06-05 DIAGNOSIS — Z4801 Encounter for change or removal of surgical wound dressing: Secondary | ICD-10-CM | POA: Diagnosis not present

## 2012-06-05 DIAGNOSIS — I4891 Unspecified atrial fibrillation: Secondary | ICD-10-CM | POA: Diagnosis not present

## 2012-06-05 DIAGNOSIS — D649 Anemia, unspecified: Secondary | ICD-10-CM | POA: Diagnosis not present

## 2012-06-08 ENCOUNTER — Telehealth (INDEPENDENT_AMBULATORY_CARE_PROVIDER_SITE_OTHER): Payer: Self-pay | Admitting: *Deleted

## 2012-06-08 NOTE — Telephone Encounter (Signed)
Mary with [facility] called to ask for a continuation order.  Asked for order to see patient once weekly for the next 4 weeks then she believes she will be able to discharge patient.

## 2012-06-10 DIAGNOSIS — Z433 Encounter for attention to colostomy: Secondary | ICD-10-CM | POA: Diagnosis not present

## 2012-06-10 DIAGNOSIS — D649 Anemia, unspecified: Secondary | ICD-10-CM | POA: Diagnosis not present

## 2012-06-10 DIAGNOSIS — I2510 Atherosclerotic heart disease of native coronary artery without angina pectoris: Secondary | ICD-10-CM | POA: Diagnosis not present

## 2012-06-10 DIAGNOSIS — I4891 Unspecified atrial fibrillation: Secondary | ICD-10-CM | POA: Diagnosis not present

## 2012-06-10 DIAGNOSIS — Z4801 Encounter for change or removal of surgical wound dressing: Secondary | ICD-10-CM | POA: Diagnosis not present

## 2012-06-11 ENCOUNTER — Telehealth: Payer: Self-pay | Admitting: Cardiovascular Disease

## 2012-06-11 DIAGNOSIS — I2510 Atherosclerotic heart disease of native coronary artery without angina pectoris: Secondary | ICD-10-CM | POA: Diagnosis not present

## 2012-06-11 DIAGNOSIS — D649 Anemia, unspecified: Secondary | ICD-10-CM | POA: Diagnosis not present

## 2012-06-11 DIAGNOSIS — N289 Disorder of kidney and ureter, unspecified: Secondary | ICD-10-CM | POA: Diagnosis not present

## 2012-06-11 DIAGNOSIS — Z433 Encounter for attention to colostomy: Secondary | ICD-10-CM | POA: Diagnosis not present

## 2012-06-11 DIAGNOSIS — Z4801 Encounter for change or removal of surgical wound dressing: Secondary | ICD-10-CM | POA: Diagnosis not present

## 2012-06-11 DIAGNOSIS — I4891 Unspecified atrial fibrillation: Secondary | ICD-10-CM

## 2012-06-11 MED ORDER — DABIGATRAN ETEXILATE MESYLATE 150 MG PO CAPS: 150.0000 mg | ORAL_CAPSULE | Freq: Two times a day (BID) | ORAL | Status: DC

## 2012-06-11 NOTE — Telephone Encounter (Signed)
Spoke with pt and gave him information from Dr. Clifton James. Will send prescription to [facility] [address]

## 2012-06-11 NOTE — Telephone Encounter (Signed)
Martin Bible, I spoke to Dr. Danielle Dess and [name] said it is ok to restart his Pradaxa. Can we get him back on Pradaxa 150 mg po BID? Thanks, chris

## 2012-06-11 NOTE — Telephone Encounter (Signed)
Left message to call back  

## 2012-06-11 NOTE — Addendum Note (Signed)
Addended by: Dossie Arbour on: 06/11/2012 04:15 PM   Modules accepted: Orders

## 2012-06-12 ENCOUNTER — Encounter (INDEPENDENT_AMBULATORY_CARE_PROVIDER_SITE_OTHER): Payer: Self-pay | Admitting: General Surgery

## 2012-06-12 ENCOUNTER — Ambulatory Visit (INDEPENDENT_AMBULATORY_CARE_PROVIDER_SITE_OTHER): Payer: Managed Care, Other (non HMO) | Admitting: General Surgery

## 2012-06-12 VITALS — BP 164/104 | HR 68 | Temp 97.8°F | Resp 14 | Ht 68.0 in | Wt 169.0 lb

## 2012-06-12 DIAGNOSIS — Z09 Encounter for follow-up examination after completed treatment for conditions other than malignant neoplasm: Secondary | ICD-10-CM

## 2012-06-12 NOTE — Progress Notes (Signed)
History: Patient returns now just over 2 months following [name] colectomy for diverticulitis with perforation and abscess. Several weeks ago he developed rather sudden severe leg pain and required emergency back surgery. This immediately relieved his pain and he feels he is making again steady progress. He is working with physical therapy and walking with a walker. He is wife are somewhat more comfortable taking care of the colostomy. No abdominal complaints.  Exam: BP 164/104  Pulse 68  Temp(Src) 97.8 F (36.6 C) (Temporal)  Resp 14  Ht 5\' 8"  (1.727 m)  Wt 169 lb (76.658 kg)  BMI 25.7 kg/m2 General: Somewhat weak appearing but ambulating with a walker without difficulty Abdomen: Soft and nontender. Midline incision is now completely healed. Colostomy is healthy.  Assessment and plan: Doing well following [name] colectomy for perforated diverticulitis. He will continue physical therapy. I will see him back in 3 months and assuming he continues to make good progress we can talk about scheduling him for colostomy takedown.

## 2012-06-15 DIAGNOSIS — D649 Anemia, unspecified: Secondary | ICD-10-CM | POA: Diagnosis not present

## 2012-06-15 DIAGNOSIS — I4891 Unspecified atrial fibrillation: Secondary | ICD-10-CM | POA: Diagnosis not present

## 2012-06-15 DIAGNOSIS — I2510 Atherosclerotic heart disease of native coronary artery without angina pectoris: Secondary | ICD-10-CM | POA: Diagnosis not present

## 2012-06-15 DIAGNOSIS — Z4801 Encounter for change or removal of surgical wound dressing: Secondary | ICD-10-CM | POA: Diagnosis not present

## 2012-06-15 DIAGNOSIS — Z433 Encounter for attention to colostomy: Secondary | ICD-10-CM | POA: Diagnosis not present

## 2012-06-19 DIAGNOSIS — I4891 Unspecified atrial fibrillation: Secondary | ICD-10-CM | POA: Diagnosis not present

## 2012-06-19 DIAGNOSIS — Z433 Encounter for attention to colostomy: Secondary | ICD-10-CM | POA: Diagnosis not present

## 2012-06-19 DIAGNOSIS — Z4801 Encounter for change or removal of surgical wound dressing: Secondary | ICD-10-CM | POA: Diagnosis not present

## 2012-06-19 DIAGNOSIS — D649 Anemia, unspecified: Secondary | ICD-10-CM | POA: Diagnosis not present

## 2012-06-19 DIAGNOSIS — I2510 Atherosclerotic heart disease of native coronary artery without angina pectoris: Secondary | ICD-10-CM | POA: Diagnosis not present

## 2012-06-26 ENCOUNTER — Telehealth (INDEPENDENT_AMBULATORY_CARE_PROVIDER_SITE_OTHER): Payer: Self-pay | Admitting: *Deleted

## 2012-06-26 NOTE — Telephone Encounter (Signed)
Judeth Cornfield with [facility] called to request one more visit out to patient's home to assess that he has all of his supplies for his ostomy that he needs and one last time to discuss and verify patient is competent and able to perform everything for himself.

## 2012-06-29 DIAGNOSIS — I4891 Unspecified atrial fibrillation: Secondary | ICD-10-CM | POA: Diagnosis not present

## 2012-06-29 DIAGNOSIS — I2510 Atherosclerotic heart disease of native coronary artery without angina pectoris: Secondary | ICD-10-CM | POA: Diagnosis not present

## 2012-06-29 DIAGNOSIS — D649 Anemia, unspecified: Secondary | ICD-10-CM | POA: Diagnosis not present

## 2012-06-29 DIAGNOSIS — Z4801 Encounter for change or removal of surgical wound dressing: Secondary | ICD-10-CM | POA: Diagnosis not present

## 2012-06-29 DIAGNOSIS — Z433 Encounter for attention to colostomy: Secondary | ICD-10-CM | POA: Diagnosis not present

## 2012-07-13 ENCOUNTER — Telehealth (INDEPENDENT_AMBULATORY_CARE_PROVIDER_SITE_OTHER): Payer: Self-pay | Admitting: *Deleted

## 2012-07-13 NOTE — Telephone Encounter (Signed)
Patient called to check on the status of the paperwork for his handicap sticker and also asking for an appt the first of August.  Explained to patient that the calendar for August has not been released at this time.

## 2012-07-15 NOTE — Telephone Encounter (Signed)
Patient handicap sticker application has been completed by Dr. Johna Sheriff and mailed back to [facility].  Has not been scanned in EPIC due to scanner being down at the present time.

## 2012-07-31 ENCOUNTER — Telehealth: Payer: Self-pay | Admitting: Internal Medicine

## 2012-07-31 ENCOUNTER — Ambulatory Visit: Payer: Managed Care, Other (non HMO)

## 2012-07-31 DIAGNOSIS — K5792 Diverticulitis of intestine, part unspecified, without perforation or abscess without bleeding: Secondary | ICD-10-CM

## 2012-07-31 DIAGNOSIS — R198 Other specified symptoms and signs involving the digestive system and abdomen: Secondary | ICD-10-CM

## 2012-07-31 LAB — CBC WITH DIFFERENTIAL/PLATELET
Hemoglobin: 13.1 g/dL (ref 13.0–17.0)
Lymphocytes Relative: 17 % (ref 12–46)
Lymphs Abs: 1.1 10*3/uL (ref 0.7–4.0)
MCH: 32 pg (ref 26.0–34.0)
Monocytes Relative: 9 % (ref 3–12)
Neutro Abs: 4.6 10*3/uL (ref 1.7–7.7)
Neutrophils Relative %: 72 % (ref 43–77)
Platelets: 118 10*3/uL — ABNORMAL LOW (ref 150–400)
RBC: 4.09 MIL/uL — ABNORMAL LOW (ref 4.22–5.81)
WBC: 6.4 10*3/uL (ref 4.0–10.5)

## 2012-07-31 LAB — BASIC METABOLIC PANEL
BUN: 9 mg/dL (ref 6–23)
CO2: 27 mEq/L (ref 19–32)
Calcium: 9.4 mg/dL (ref 8.4–10.5)
Chloride: 101 mEq/L (ref 96–112)
Creatinine, Ser: 1.1 mg/dL (ref 0.4–1.5)
GFR: 69.84 mL/min (ref >60.00)
Glucose, Bld: 134 mg/dL — ABNORMAL HIGH (ref 70–99)
Potassium: 3.8 mEq/L (ref 3.5–5.1)
Sodium: 136 mEq/L (ref 135–145)

## 2012-07-31 NOTE — Telephone Encounter (Signed)
Pt with colostomy d/t diverticulitis. Pt reports everything is fine and the colostomy is working well. Yesterday and today he noticed a black discharge coming from the colostomy; this is not accompanied with stool and he estimates 1-3 cc's of the black liquid. He reports eating like normal and he did have 3-4 drinks on the evenings prior to the discharge. Spoke with DRs Juanda Chance and Pyrtle who were sitting together at [facility] and both agreed pt should come in for stat CBC and BMET for a benchmark and monitor the colostomy output over the weekend; black discharge and his stool for changes in color. If greatly increased, call our doc on call. We will inform him of the lab results today and I will forward this to Dr Marina Goodell who is hospital doc next week. Pt stated understanding.

## 2012-08-03 NOTE — Telephone Encounter (Signed)
lmom for pt to call back

## 2012-08-03 NOTE — Telephone Encounter (Signed)
Pt reports he is doing well and had a good weekend. He has not seen any more black discharge and his stools are normal for him. Explained to him Dr Marina Goodell was concerned and wanted him worked in if his problem had continued. Pt states maybe he over reacted; he will monitor the colostomy output and call if he notices any more black discharge.

## 2012-08-03 NOTE — Telephone Encounter (Signed)
Check on him today and document. Any questions or problems, have an extender see him in the office. Make sure he is on a daily PPI. Thanks

## 2012-08-12 DIAGNOSIS — M6281 Muscle weakness (generalized): Secondary | ICD-10-CM | POA: Diagnosis not present

## 2012-08-19 DIAGNOSIS — M6281 Muscle weakness (generalized): Secondary | ICD-10-CM | POA: Diagnosis not present

## 2012-08-21 DIAGNOSIS — M6281 Muscle weakness (generalized): Secondary | ICD-10-CM | POA: Diagnosis not present

## 2012-08-26 DIAGNOSIS — M6281 Muscle weakness (generalized): Secondary | ICD-10-CM | POA: Diagnosis not present

## 2012-08-28 DIAGNOSIS — M6281 Muscle weakness (generalized): Secondary | ICD-10-CM | POA: Diagnosis not present

## 2012-09-16 ENCOUNTER — Other Ambulatory Visit: Payer: Self-pay

## 2012-09-17 ENCOUNTER — Ambulatory Visit (INDEPENDENT_AMBULATORY_CARE_PROVIDER_SITE_OTHER): Payer: Managed Care, Other (non HMO) | Admitting: General Surgery

## 2012-09-17 ENCOUNTER — Encounter (INDEPENDENT_AMBULATORY_CARE_PROVIDER_SITE_OTHER): Payer: Self-pay | Admitting: General Surgery

## 2012-09-17 VITALS — BP 138/72 | HR 72 | Temp 97.8°F | Resp 14 | Ht 68.0 in | Wt 176.6 lb

## 2012-09-17 DIAGNOSIS — Z933 Colostomy status: Secondary | ICD-10-CM

## 2012-09-17 NOTE — Progress Notes (Signed)
Subjective:   followup [name] colectomy with colostomy  Patient ID: [name]., male   DOB: [birth year], 72 y.o.   MRN: [id number]  HPI Patient returns now 6 months following [name] colectomy for severe diverticulitis with pericolonic abscess. At this point he is feeling pretty well. He is still having therapy from his back surgery but gaining strength. Denies any abdominal complaints. He is anxious to get his colostomy reversed.  Past Medical History  Diagnosis Date  . Hyperglycemia   . CAD (coronary artery disease)   . Hyperlipidemia   . Chronic atrial fibrillation     Dr. Clifton James  . Anemia   . Gilbert's syndrome   . Hypertension   . Hemorrhoids   . Cataract     BILATERAL  . Renal insufficiency   . Myocardial infarction     history of. 1983  . AAA (abdominal aortic aneurysm)   . Duodenal ulcer   . History of GI bleed   . Heart murmur   . Heart attack   . History of blood transfusion   . Difficult intubation 2005   Past Surgical History  Procedure Laterality Date  . Cath/angioplasy      [id number], [facility] by Dr. Brett Canales [facility] of angioplasty)  . Carotid endarterectomy      bilaterally 2007  . Tonsillectomy    . No colonoscopy to date      ("I don't have a good excuse"). SOC reviewed  . Cataract extraction    . Pyloric stenosis    . Esophagogastroduodenoscopy  04/06/2011    Procedure: ESOPHAGOGASTRODUODENOSCOPY (EGD);  Surgeon: Theda Belfast, MD;  Location: [facility];  Service: Endoscopy;  Laterality: N/A;  . Eye surgery  2011    bilateral  . Colostomy N/A 04/07/2012    Procedure: COLOSTOMY;  Surgeon: Mariella Saa, MD;  Location: [facility];  Service: General;  Laterality: N/A;  Sigmoid Colectomy Colostomy   . Partial colectomy N/A 04/07/2012    Procedure: PARTIAL COLECTOMY;  Surgeon: Mariella Saa, MD;  Location: [facility];  Service: General;  Laterality: N/A;  Sigmoid Colectomy Colostomy hartman procedure  . Laminectomy N/A 05/23/2012     Procedure: LUMBAR four-five LAMINECTOMY ;  Surgeon: Barnett Abu, MD;  Location: [facility];  Service: Neurosurgery;  Laterality: N/A;   Current Outpatient Prescriptions  Medication Sig Dispense Refill  . allopurinol (ZYLOPRIM) 100 MG tablet Take 100 mg by mouth every morning.       Marland Kitchen amLODipine (NORVASC) 5 MG tablet Take 5 mg by mouth daily.       . colchicine 0.6 MG tablet Take 0.6 mg by mouth daily as needed (For gout.).       Marland Kitchen dabigatran (PRADAXA) 150 MG CAPS Take 1 capsule (150 mg total) by mouth every 12 (twelve) hours.  60 capsule  6  . ferrous sulfate 325 (65 FE) MG tablet Take 325 mg by mouth every morning.       . fluconazole (DIFLUCAN) 200 MG tablet       . HYDROcodone-acetaminophen (NORCO) 5-325 MG per tablet Take 1 tablet by mouth every 6 (six) hours as needed for pain.  30 tablet  0  . metoprolol succinate (TOPROL-XL) 100 MG 24 hr tablet Take 100 mg by mouth daily after breakfast.       . NULYTELY WITH FLAVOR PACKS 420 G solution       . omeprazole (PRILOSEC) 40 MG capsule Take 40 mg by mouth daily.      Marland Kitchen  oxyCODONE-acetaminophen (PERCOCET/ROXICET) 5-325 MG per tablet If needed      . rosuvastatin (CRESTOR) 20 MG tablet Take 20 mg by mouth 2 (two) times a week. He takes on Monday and Friday.      Marland Kitchen VITAMIN D, CHOLECALCIFEROL, PO Take 1 tablet by mouth daily.       No current facility-administered medications for this visit.   Allergies  Allergen Reactions  . Eggs Or Egg-Derived Products Nausea And Vomiting   History  Substance Use Topics  . Smoking status: Former Smoker -- 3.00 packs/day    Types: Cigarettes    Quit date: 02/11/1981  . Smokeless tobacco: Never Used  . Alcohol Use: 12.6 oz/week    21 Shots of liquor per week     Comment: daily     Review of Systems  HENT: Negative.   Respiratory: Negative.   Cardiovascular: Negative.   Gastrointestinal: Negative.   Genitourinary: Negative.   Musculoskeletal: Positive for back pain and arthralgias.        Objective:   Physical Exam BP 138/72  Pulse 72  Temp(Src) 97.8 F (36.6 C) (Temporal)  Resp 14  Ht 5\' 8"  (1.727 m)  Wt 176 lb 9.6 oz (80.105 kg)  BMI 26.86 kg/m2 General: Alert, well-developed Caucasian male, in no distress Skin: Warm and dry without rash or infection. HEENT: No palpable masses or thyromegaly. Sclera nonicteric. Pupils equal round and reactive. Oropharynx clear. Lymph nodes: No cervical, supraclavicular, or inguinal nodes palpable. Lungs: Breath sounds clear and equal without increased work of breathing Cardiovascular: Regular rate and rhythm without murmur. No JVD or edema. Peripheral pulses intact. Abdomen: Nondistended. Soft and nontender. No masses palpable. No organomegaly. No palpable hernias. Well-healed incision. Colostomy left lower quadrant Extremities: 1+ ankle edema or joint swelling or deformity. No chronic venous stasis changes. Neurologic: Alert and fully oriented. Gait normal.    Assessment:     Status post [name] colectomy for diverticulitis and abscess. He has done well without complication. Desires colostomy reversal. We discussed the nature of the surgery and expected recovery as well as risks of anesthetic complications, bleeding, infection anastomotic leak. He understands and desires to proceed.    Plan:     Takedown of [name] colostomy following mechanical and antibiotic bowel prep.

## 2012-09-21 ENCOUNTER — Encounter (HOSPITAL_COMMUNITY): Payer: Self-pay | Admitting: Pharmacy Technician

## 2012-09-23 ENCOUNTER — Encounter (HOSPITAL_COMMUNITY)
Admission: RE | Admit: 2012-09-23 | Discharge: 2012-09-23 | Disposition: A | Discharge status: Home / Self Care | Payer: Managed Care, Other (non HMO) | Source: Ambulatory Visit | Attending: General Surgery | Admitting: General Surgery

## 2012-09-23 ENCOUNTER — Encounter (HOSPITAL_COMMUNITY): Payer: Self-pay

## 2012-09-23 DIAGNOSIS — M6281 Muscle weakness (generalized): Secondary | ICD-10-CM | POA: Diagnosis not present

## 2012-09-23 DIAGNOSIS — Z01812 Encounter for preprocedural laboratory examination: Secondary | ICD-10-CM | POA: Insufficient documentation

## 2012-09-23 HISTORY — DX: Gout, unspecified: M10.9

## 2012-09-23 LAB — BASIC METABOLIC PANEL
BUN: 10 mg/dL (ref 6–23)
CO2: 26 mEq/L (ref 19–32)
Creatinine, Ser: 1.05 mg/dL (ref 0.50–1.35)
GFR calc Af Amer: 80 mL/min — ABNORMAL LOW (ref >90)
GFR calc non Af Amer: 69 mL/min — ABNORMAL LOW (ref >90)
Glucose, Bld: 83 mg/dL (ref 70–99)

## 2012-09-23 LAB — CBC
HCT: 39.8 % (ref 39.0–52.0)
Hemoglobin: 12.9 g/dL — ABNORMAL LOW (ref 13.0–17.0)
MCH: 32.7 pg (ref 26.0–34.0)
MCHC: 32.4 g/dL (ref 30.0–36.0)
MCV: 100.8 fL — ABNORMAL HIGH (ref 78.0–100.0)
RBC: 3.95 MIL/uL — ABNORMAL LOW (ref 4.22–5.81)

## 2012-09-23 LAB — SURGICAL PCR SCREEN: MRSA, PCR: NEGATIVE

## 2012-09-23 NOTE — Progress Notes (Signed)
Chest 2 view xray 11-25-2011 [facility] on chart

## 2012-09-23 NOTE — Progress Notes (Signed)
Cbc results routed to dr Johna Sheriff inbox by epic

## 2012-09-23 NOTE — Patient Instructions (Addendum)
[name].  09/23/2012   Your procedure is scheduled on: 09-30-2012  Report to [facility] at  630 AM.  Call this number if you have problems the morning of surgery [phone]   Remember:   Do not eat food or drink liquids :After Midnight.     Take these medicines the morning of surgery with A SIP OF WATER: allopurinol, amlodipine, metoprolol succinate                                SEE [facility] PREPARING FOR SURGERY SHEET   Do not wear jewelry, make-up or nail polish.  Do not wear lotions, powders, or perfumes. You may wear deodorant.   Men may shave face and neck.  Do not bring valuables to the hospital. [facility] IS NOT RESPONSIBLE FOR VALUEABLES.  Contacts, dentures or bridgework may not be worn into surgery.  Leave suitcase in the car. After surgery it may be brought to your room.  For patients admitted to the hospital, checkout time is 11:00 AM the day of discharge.   Patients discharged the day of surgery will not be allowed to drive home.  Name and phone number of your driver:  Special Instructions: N/A   Please read over the following fact sheets that you were given: MRSA Information.  Call Cain Sieve RN pre op nurse if needed [phone]    FAILURE TO FOLLOW THESE INSTRUCTIONS MAY RESULT IN THE CANCELLATION OF YOUR SURGERY.  PATIENT SIGNATURE___________________________________________  NURSE SIGNATURE_____________________________________________

## 2012-09-29 MED ORDER — CEFOTETAN DISODIUM 2 G IJ SOLR
2.0000 g | INTRAMUSCULAR | Status: AC
  Administered 2012-09-30: 2 g via INTRAVENOUS
  Filled 2012-09-29 (×2): qty 2

## 2012-09-30 ENCOUNTER — Inpatient Hospital Stay (HOSPITAL_COMMUNITY)
Admission: RE | Admit: 2012-09-30 | Discharge: 2012-10-04 | DRG: 330 | Disposition: A | Discharge status: Home / Self Care | Payer: Managed Care, Other (non HMO) | Source: Ambulatory Visit | Attending: General Surgery | Admitting: General Surgery

## 2012-09-30 ENCOUNTER — Encounter (HOSPITAL_COMMUNITY): Payer: Self-pay | Admitting: Anesthesiology

## 2012-09-30 ENCOUNTER — Inpatient Hospital Stay (HOSPITAL_COMMUNITY): Payer: Managed Care, Other (non HMO) | Admitting: Anesthesiology

## 2012-09-30 ENCOUNTER — Encounter (HOSPITAL_COMMUNITY)
Admission: RE | Disposition: A | Discharge status: Home / Self Care | Payer: Self-pay | Source: Ambulatory Visit | Attending: General Surgery

## 2012-09-30 ENCOUNTER — Encounter (HOSPITAL_COMMUNITY): Payer: Self-pay | Admitting: *Deleted

## 2012-09-30 DIAGNOSIS — Z87891 Personal history of nicotine dependence: Secondary | ICD-10-CM

## 2012-09-30 DIAGNOSIS — Z433 Encounter for attention to colostomy: Secondary | ICD-10-CM | POA: Diagnosis not present

## 2012-09-30 DIAGNOSIS — I4891 Unspecified atrial fibrillation: Secondary | ICD-10-CM | POA: Diagnosis present

## 2012-09-30 DIAGNOSIS — K5732 Diverticulitis of large intestine without perforation or abscess without bleeding: Secondary | ICD-10-CM | POA: Diagnosis present

## 2012-09-30 DIAGNOSIS — I252 Old myocardial infarction: Secondary | ICD-10-CM

## 2012-09-30 DIAGNOSIS — Z933 Colostomy status: Secondary | ICD-10-CM

## 2012-09-30 DIAGNOSIS — Z79899 Other long term (current) drug therapy: Secondary | ICD-10-CM

## 2012-09-30 DIAGNOSIS — E785 Hyperlipidemia, unspecified: Secondary | ICD-10-CM | POA: Diagnosis present

## 2012-09-30 DIAGNOSIS — E876 Hypokalemia: Secondary | ICD-10-CM | POA: Diagnosis not present

## 2012-09-30 DIAGNOSIS — I10 Essential (primary) hypertension: Secondary | ICD-10-CM | POA: Diagnosis present

## 2012-09-30 DIAGNOSIS — I2510 Atherosclerotic heart disease of native coronary artery without angina pectoris: Secondary | ICD-10-CM | POA: Diagnosis present

## 2012-09-30 DIAGNOSIS — Z01812 Encounter for preprocedural laboratory examination: Secondary | ICD-10-CM

## 2012-09-30 HISTORY — PX: COLOSTOMY TAKEDOWN: SHX5783

## 2012-09-30 SURGERY — CLOSURE, COLOSTOMY
Anesthesia: General | Site: Abdomen | Wound class: Clean Contaminated

## 2012-09-30 MED ORDER — ONDANSETRON HCL 4 MG/2ML IJ SOLN: 4.0000 mg | Freq: Four times a day (QID) | INTRAMUSCULAR | Status: DC | PRN

## 2012-09-30 MED ORDER — HYDROMORPHONE HCL PF 1 MG/ML IJ SOLN
0.2500 mg | INTRAMUSCULAR | Status: DC | PRN
  Administered 2012-09-30: 0.25 mg via INTRAVENOUS

## 2012-09-30 MED ORDER — LACTATED RINGERS IV SOLN
INTRAVENOUS | Status: DC | PRN
  Administered 2012-09-30 (×3): via INTRAVENOUS

## 2012-09-30 MED ORDER — GLYCOPYRROLATE 0.2 MG/ML IJ SOLN
INTRAMUSCULAR | Status: DC | PRN
  Administered 2012-09-30: 0.6 mg via INTRAVENOUS

## 2012-09-30 MED ORDER — OXYCODONE-ACETAMINOPHEN 5-325 MG PO TABS
1.0000 | ORAL_TABLET | ORAL | Status: DC | PRN
  Administered 2012-10-02: 1 via ORAL
  Filled 2012-09-30: qty 1

## 2012-09-30 MED ORDER — COLCHICINE 0.6 MG PO TABS
0.6000 mg | ORAL_TABLET | Freq: Every day | ORAL | Status: DC | PRN
  Administered 2012-10-01: 0.6 mg via ORAL
  Filled 2012-09-30: qty 1

## 2012-09-30 MED ORDER — HYDROMORPHONE HCL PF 1 MG/ML IJ SOLN
INTRAMUSCULAR | Status: DC | PRN
  Administered 2012-09-30 (×2): 1 mg via INTRAVENOUS

## 2012-09-30 MED ORDER — OXYCODONE HCL 5 MG/5ML PO SOLN
5.0000 mg | Freq: Once | ORAL | Status: DC | PRN
  Filled 2012-09-30: qty 5

## 2012-09-30 MED ORDER — PANTOPRAZOLE SODIUM 40 MG PO TBEC
40.0000 mg | DELAYED_RELEASE_TABLET | Freq: Every day | ORAL | Status: DC
  Administered 2012-09-30 – 2012-10-03 (×4): 40 mg via ORAL
  Filled 2012-09-30 (×5): qty 1

## 2012-09-30 MED ORDER — ONDANSETRON HCL 4 MG/2ML IJ SOLN
INTRAMUSCULAR | Status: DC | PRN
  Administered 2012-09-30: 4 mg via INTRAVENOUS

## 2012-09-30 MED ORDER — MORPHINE SULFATE (PF) 1 MG/ML IV SOLN
INTRAVENOUS | Status: DC
  Administered 2012-09-30: 12:00:00 via INTRAVENOUS
  Administered 2012-09-30: 6 mg via INTRAVENOUS
  Administered 2012-10-01: 3 mg via INTRAVENOUS

## 2012-09-30 MED ORDER — ROCURONIUM BROMIDE 100 MG/10ML IV SOLN
INTRAVENOUS | Status: DC | PRN
  Administered 2012-09-30: 30 mg via INTRAVENOUS
  Administered 2012-09-30 (×3): 10 mg via INTRAVENOUS

## 2012-09-30 MED ORDER — SODIUM CHLORIDE 0.9 % IJ SOLN: 9.0000 mL | INTRAMUSCULAR | Status: DC | PRN

## 2012-09-30 MED ORDER — ALVIMOPAN 12 MG PO CAPS
12.0000 mg | ORAL_CAPSULE | Freq: Once | ORAL | Status: AC
  Administered 2012-09-30: 12 mg via ORAL
  Filled 2012-09-30: qty 1

## 2012-09-30 MED ORDER — NALOXONE HCL 0.4 MG/ML IJ SOLN: 0.4000 mg | INTRAMUSCULAR | Status: DC | PRN

## 2012-09-30 MED ORDER — PROMETHAZINE HCL 25 MG/ML IJ SOLN: 6.2500 mg | INTRAMUSCULAR | Status: DC | PRN

## 2012-09-30 MED ORDER — NEOSTIGMINE METHYLSULFATE 1 MG/ML IJ SOLN
INTRAMUSCULAR | Status: DC | PRN
  Administered 2012-09-30: 4 mg via INTRAVENOUS

## 2012-09-30 MED ORDER — DIPHENHYDRAMINE HCL 12.5 MG/5ML PO ELIX: 12.5000 mg | ORAL_SOLUTION | Freq: Four times a day (QID) | ORAL | Status: DC | PRN

## 2012-09-30 MED ORDER — MIDAZOLAM HCL 5 MG/5ML IJ SOLN
INTRAMUSCULAR | Status: DC | PRN
  Administered 2012-09-30: 2 mg via INTRAVENOUS

## 2012-09-30 MED ORDER — LIDOCAINE HCL (CARDIAC) 20 MG/ML IV SOLN
INTRAVENOUS | Status: DC | PRN
  Administered 2012-09-30: 50 mg via INTRAVENOUS

## 2012-09-30 MED ORDER — ALVIMOPAN 12 MG PO CAPS
12.0000 mg | ORAL_CAPSULE | Freq: Two times a day (BID) | ORAL | Status: DC
  Administered 2012-10-01 – 2012-10-03 (×5): 12 mg via ORAL
  Filled 2012-09-30 (×6): qty 1

## 2012-09-30 MED ORDER — FENTANYL CITRATE 0.05 MG/ML IJ SOLN
INTRAMUSCULAR | Status: DC | PRN
  Administered 2012-09-30 (×3): 50 ug via INTRAVENOUS
  Administered 2012-09-30: 100 ug via INTRAVENOUS
  Administered 2012-09-30 (×2): 50 ug via INTRAVENOUS

## 2012-09-30 MED ORDER — METOPROLOL SUCCINATE ER 100 MG PO TB24
100.0000 mg | ORAL_TABLET | Freq: Every day | ORAL | Status: DC
  Administered 2012-10-01 – 2012-10-03 (×3): 100 mg via ORAL
  Filled 2012-09-30 (×4): qty 1

## 2012-09-30 MED ORDER — SUCCINYLCHOLINE CHLORIDE 20 MG/ML IJ SOLN
INTRAMUSCULAR | Status: DC | PRN
  Administered 2012-09-30: 100 mg via INTRAVENOUS

## 2012-09-30 MED ORDER — HEPARIN SODIUM (PORCINE) 5000 UNIT/ML IJ SOLN
5000.0000 [IU] | Freq: Three times a day (TID) | INTRAMUSCULAR | Status: DC
  Administered 2012-10-01: 5000 [IU] via SUBCUTANEOUS
  Filled 2012-09-30 (×4): qty 1

## 2012-09-30 MED ORDER — ALLOPURINOL 100 MG PO TABS
100.0000 mg | ORAL_TABLET | Freq: Every morning | ORAL | Status: DC
  Administered 2012-10-01 – 2012-10-03 (×3): 100 mg via ORAL
  Filled 2012-09-30 (×4): qty 1

## 2012-09-30 MED ORDER — PROPOFOL 10 MG/ML IV BOLUS
INTRAVENOUS | Status: DC | PRN
  Administered 2012-09-30: 180 mg via INTRAVENOUS

## 2012-09-30 MED ORDER — OXYCODONE HCL 5 MG PO TABS: 5.0000 mg | ORAL_TABLET | Freq: Once | ORAL | Status: DC | PRN

## 2012-09-30 MED ORDER — ONDANSETRON HCL 4 MG PO TABS: 4.0000 mg | ORAL_TABLET | Freq: Four times a day (QID) | ORAL | Status: DC | PRN

## 2012-09-30 MED ORDER — HYDROMORPHONE HCL PF 1 MG/ML IJ SOLN
INTRAMUSCULAR | Status: AC
  Filled 2012-09-30: qty 1

## 2012-09-30 MED ORDER — KCL IN DEXTROSE-NACL 20-5-0.9 MEQ/L-%-% IV SOLN
INTRAVENOUS | Status: DC
  Administered 2012-09-30 – 2012-10-03 (×5): via INTRAVENOUS
  Filled 2012-09-30 (×9): qty 1000

## 2012-09-30 MED ORDER — AMLODIPINE BESYLATE 5 MG PO TABS
5.0000 mg | ORAL_TABLET | Freq: Every morning | ORAL | Status: DC
  Administered 2012-10-01 – 2012-10-03 (×3): 5 mg via ORAL
  Filled 2012-09-30 (×4): qty 1

## 2012-09-30 MED ORDER — LACTATED RINGERS IV SOLN: INTRAVENOUS | Status: DC

## 2012-09-30 MED ORDER — MEPERIDINE HCL 50 MG/ML IJ SOLN: 6.2500 mg | INTRAMUSCULAR | Status: DC | PRN

## 2012-09-30 MED ORDER — DIPHENHYDRAMINE HCL 50 MG/ML IJ SOLN: 12.5000 mg | Freq: Four times a day (QID) | INTRAMUSCULAR | Status: DC | PRN

## 2012-09-30 SURGICAL SUPPLY — 64 items
APPLICATOR COTTON TIP 6IN STRL (MISCELLANEOUS) ×4 IMPLANT
BLADE EXTENDED COATED 6.5IN (ELECTRODE) ×2 IMPLANT
BLADE HEX COATED 2.75 (ELECTRODE) ×2 IMPLANT
BLADE SURG SZ10 CARB STEEL (BLADE) ×2 IMPLANT
CANISTER SUCTION 2500CC (MISCELLANEOUS) ×4 IMPLANT
CLIP TI LARGE 6 (CLIP) IMPLANT
CLOTH BEACON ORANGE TIMEOUT ST (SAFETY) ×2 IMPLANT
COVER MAYO STAND STRL (DRAPES) ×2 IMPLANT
DRAPE LAPAROSCOPIC ABDOMINAL (DRAPES) ×2 IMPLANT
DRAPE LG THREE QUARTER DISP (DRAPES) ×2 IMPLANT
DRAPE WARM FLUID 44X44 (DRAPE) ×2 IMPLANT
DRSG OPSITE POSTOP 3X4 (GAUZE/BANDAGES/DRESSINGS) ×2 IMPLANT
DRSG OPSITE POSTOP 4X6 (GAUZE/BANDAGES/DRESSINGS) ×2 IMPLANT
DRSG OPSITE POSTOP 4X8 (GAUZE/BANDAGES/DRESSINGS) ×2 IMPLANT
ELECT REM PT RETURN 9FT ADLT (ELECTROSURGICAL) ×2
ELECTRODE REM PT RTRN 9FT ADLT (ELECTROSURGICAL) ×1 IMPLANT
GLOVE BIOGEL PI IND STRL 7.0 (GLOVE) ×2 IMPLANT
GLOVE BIOGEL PI IND STRL 7.5 (GLOVE) ×3 IMPLANT
GLOVE BIOGEL PI INDICATOR 7.0 (GLOVE) ×2
GLOVE BIOGEL PI INDICATOR 7.5 (GLOVE) ×3
GLOVE EUDERMIC 7 POWDERFREE (GLOVE) ×6 IMPLANT
GLOVE SS BIOGEL STRL SZ 7.5 (GLOVE) ×5 IMPLANT
GLOVE SUPERSENSE BIOGEL SZ 7.5 (GLOVE) ×5
GLOVE SURG SIGNA 7.5 PF LTX (GLOVE) ×4 IMPLANT
GLOVE SURG SS PI 7.0 STRL IVOR (GLOVE) ×2 IMPLANT
GLOVE SURG SS PI 7.5 STRL IVOR (GLOVE) ×4 IMPLANT
GOWN STRL NON-REIN LRG LVL3 (GOWN DISPOSABLE) ×2 IMPLANT
GOWN STRL REIN XL XLG (GOWN DISPOSABLE) ×10 IMPLANT
KIT BASIN OR (CUSTOM PROCEDURE TRAY) ×2 IMPLANT
LEGGING LITHOTOMY PAIR STRL (DRAPES) ×2 IMPLANT
LIGASURE IMPACT 36 18CM CVD LR (INSTRUMENTS) IMPLANT
LUBRICANT JELLY K Y 4OZ (MISCELLANEOUS) ×2 IMPLANT
NS IRRIG 1000ML POUR BTL (IV SOLUTION) ×4 IMPLANT
PACK GENERAL/GYN (CUSTOM PROCEDURE TRAY) ×2 IMPLANT
SCALPEL HARMONIC ACE (MISCELLANEOUS) ×2 IMPLANT
SEALER TISSUE G2 CVD JAW 35 (ENDOMECHANICALS) IMPLANT
SEALER TISSUE G2 CVD JAW 45CM (ENDOMECHANICALS)
SET BERKELEY SUCTION TUBING (SUCTIONS) ×2 IMPLANT
SPONGE GAUZE 4X4 12PLY (GAUZE/BANDAGES/DRESSINGS) ×2 IMPLANT
SPONGE LAP 18X18 X RAY DECT (DISPOSABLE) ×8 IMPLANT
STAPLER CIRC CVD 29MM 37CM (STAPLE) ×2 IMPLANT
STAPLER PROXIMATE 75MM BLUE (STAPLE) ×2 IMPLANT
STAPLER VISISTAT 35W (STAPLE) ×2 IMPLANT
SUCTION POOLE TIP (SUCTIONS) ×2 IMPLANT
SUT NOV 1 T60/GS (SUTURE) IMPLANT
SUT NOVA 1 T20/GS 25DT (SUTURE) IMPLANT
SUT NOVA NAB DX-16 0-1 5-0 T12 (SUTURE) ×4 IMPLANT
SUT NOVA T20/GS 25 (SUTURE) IMPLANT
SUT PDS AB 1 CTX 36 (SUTURE) IMPLANT
SUT PDS AB 1 TP1 96 (SUTURE) IMPLANT
SUT PROLENE 2 0 KS (SUTURE) ×2 IMPLANT
SUT SILK 2 0 (SUTURE) ×1
SUT SILK 2 0 SH CR/8 (SUTURE) ×4 IMPLANT
SUT SILK 2 0SH CR/8 30 (SUTURE) IMPLANT
SUT SILK 2-0 18XBRD TIE 12 (SUTURE) ×1 IMPLANT
SUT SILK 2-0 30XBRD TIE 12 (SUTURE) IMPLANT
SUT SILK 3 0 (SUTURE) ×2
SUT SILK 3 0 SH CR/8 (SUTURE) ×2 IMPLANT
SUT SILK 3-0 18XBRD TIE 12 (SUTURE) ×2 IMPLANT
SUT VIC AB 3-0 54XBRD REEL (SUTURE) IMPLANT
SUT VIC AB 3-0 BRD 54 (SUTURE)
TOWEL OR 17X26 10 PK STRL BLUE (TOWEL DISPOSABLE) ×6 IMPLANT
TRAY FOLEY CATH 14FRSI W/METER (CATHETERS) ×2 IMPLANT
YANKAUER SUCT BULB TIP NO VENT (SUCTIONS) ×2 IMPLANT

## 2012-09-30 NOTE — Transfer of Care (Signed)
Immediate Anesthesia Transfer of Care Note  Patient: [name].  Procedure(s) Performed: Procedure(s): TAKEDOWN OF HARTMAN COLOSTOMY (N/A)  Patient Location: PACU  Anesthesia Type:General  Level of Consciousness: awake, alert  and oriented  Airway & Oxygen Therapy: Patient Spontanous Breathing and Patient connected to face mask oxygen  Post-op Assessment: Report given to PACU RN and Post -op Vital signs reviewed and stable  Post vital signs: Reviewed and stable  Complications: No apparent anesthesia complications

## 2012-09-30 NOTE — Progress Notes (Signed)
Wife arrived today and wanted Dr Johna Sheriff to be aware that patient took his bowel prep and antibiotics as directed the past two days but she did give him the "red" (cherry) flavored Magnesium citrate .

## 2012-09-30 NOTE — Anesthesia Preprocedure Evaluation (Addendum)
Anesthesia Evaluation  Patient identified by MRN, date of birth, ID band Patient awake    Reviewed: Allergy & Precautions, H&P , NPO status , Patient's Chart, lab work & pertinent test results  History of Anesthesia Complications (+) DIFFICULT AIRWAY  Airway Mallampati: II TM Distance: >3 FB Neck ROM: Full    Dental no notable dental hx. (+) Dental Advisory Given   Pulmonary neg pulmonary ROS, former smoker,  breath sounds clear to auscultation  Pulmonary exam normal       Cardiovascular hypertension, Pt. on medications and Pt. on home beta blockers - angina+ CAD and + Past MI - Peripheral Vascular Disease + dysrhythmias Atrial Fibrillation + Valvular Problems/Murmurs Rhythm:Regular Rate:Normal     Neuro/Psych negative neurological ROS  negative psych ROS   GI/Hepatic Neg liver ROS, PUD, GERD-  Medicated,  Endo/Other  negative endocrine ROS  Renal/GU Renal InsufficiencyRenal disease     Musculoskeletal negative musculoskeletal ROS (+)   Abdominal   Peds  Hematology negative hematology ROS (+)   Anesthesia Other Findings   Reproductive/Obstetrics                           Anesthesia Physical  Anesthesia Plan  ASA: III  Anesthesia Plan: General   Post-op Pain Management:    Induction: Intravenous  Airway Management Planned: Oral ETT and Video Laryngoscope Planned  Additional Equipment:   Intra-op Plan:   Post-operative Plan: Extubation in OR  Informed Consent: I have reviewed the patients History and Physical, chart, labs and discussed the procedure including the risks, benefits and alternatives for the proposed anesthesia with the patient or authorized representative who has indicated his/her understanding and acceptance.   Dental advisory given  Plan Discussed with: CRNA  Anesthesia Plan Comments:         Anesthesia Quick Evaluation

## 2012-09-30 NOTE — Interval H&P Note (Signed)
History and Physical Interval Note:  09/30/2012 8:04 AM  [name].  has presented today for surgery, with the diagnosis of status post colostomy  The various methods of treatment have been discussed with the patient and family. After consideration of risks, benefits and other options for treatment, the patient has consented to  Procedure(s): TAKEDOWN OF HARTMAN COLOSTOMY (N/A) as a surgical intervention .  The patient's history has been reviewed, patient examined, no change in status, stable for surgery.  I have reviewed the patient's chart and labs.  Questions were answered to the patient's satisfaction.     [name]

## 2012-09-30 NOTE — H&P (View-Only) (Signed)
Subjective:   followup [name] colectomy with colostomy  Patient ID: Martin S Mccubbin Jr., male   DOB: 09/10/1940, 72 y.o.   MRN: [id number]  HPI Patient returns now 6 months following [name] colectomy for severe diverticulitis with pericolonic abscess. At this point he is feeling pretty well. He is still having therapy from his back surgery but gaining strength. Denies any abdominal complaints. He is anxious to get his colostomy reversed.  Past Medical History  Diagnosis Date  . Hyperglycemia   . CAD (coronary artery disease)   . Hyperlipidemia   . Chronic atrial fibrillation     Dr. Mcalhany  . Anemia   . Gilbert's syndrome   . Hypertension   . Hemorrhoids   . Cataract     BILATERAL  . Renal insufficiency   . Myocardial infarction     history of. 1983  . AAA (abdominal aortic aneurysm)   . Duodenal ulcer   . History of GI bleed   . Heart murmur   . Heart attack   . History of blood transfusion   . Difficult intubation 2005   Past Surgical History  Procedure Laterality Date  . Cath/angioplasy      1979,1983, [facility] by Dr. Greunzig (Founder of angioplasty)  . Carotid endarterectomy      bilaterally 2007  . Tonsillectomy    . No colonoscopy to date      ("I don't have a good excuse"). SOC reviewed  . Cataract extraction    . Pyloric stenosis    . Esophagogastroduodenoscopy  04/06/2011    Procedure: ESOPHAGOGASTRODUODENOSCOPY (EGD);  Surgeon: Patrick D Hung, MD;  Location: [facility];  Service: Endoscopy;  Laterality: N/A;  . Eye surgery  2011    bilateral  . Colostomy N/A 04/07/2012    Procedure: COLOSTOMY;  Surgeon: Nhia Heaphy T Armonie Mettler, MD;  Location: [facility];  Service: General;  Laterality: N/A;  Sigmoid Colectomy Colostomy   . Partial colectomy N/A 04/07/2012    Procedure: PARTIAL COLECTOMY;  Surgeon: Juwana Thoreson T Jeriko Kowalke, MD;  Location: [facility]S;  Service: General;  Laterality: N/A;  Sigmoid Colectomy Colostomy hartman procedure  . Laminectomy N/A 05/23/2012     Procedure: LUMBAR four-five LAMINECTOMY ;  Surgeon: Henry Elsner, MD;  Location: [facility];  Service: Neurosurgery;  Laterality: N/A;   Current Outpatient Prescriptions  Medication Sig Dispense Refill  . allopurinol (ZYLOPRIM) 100 MG tablet Take 100 mg by mouth every morning.       . amLODipine (NORVASC) 5 MG tablet Take 5 mg by mouth daily.       . colchicine 0.6 MG tablet Take 0.6 mg by mouth daily as needed (For gout.).       . dabigatran (PRADAXA) 150 MG CAPS Take 1 capsule (150 mg total) by mouth every 12 (twelve) hours.  60 capsule  6  . ferrous sulfate 325 (65 FE) MG tablet Take 325 mg by mouth every morning.       . fluconazole (DIFLUCAN) 200 MG tablet       . HYDROcodone-acetaminophen (NORCO) 5-325 MG per tablet Take 1 tablet by mouth every 6 (six) hours as needed for pain.  30 tablet  0  . metoprolol succinate (TOPROL-XL) 100 MG 24 hr tablet Take 100 mg by mouth daily after breakfast.       . NULYTELY WITH FLAVOR PACKS 420 G solution       . omeprazole (PRILOSEC) 40 MG capsule Take 40 mg by mouth daily.      .   oxyCODONE-acetaminophen (PERCOCET/ROXICET) 5-325 MG per tablet If needed      . rosuvastatin (CRESTOR) 20 MG tablet Take 20 mg by mouth 2 (two) times a week. He takes on Monday and Friday.      . VITAMIN D, CHOLECALCIFEROL, PO Take 1 tablet by mouth daily.       No current facility-administered medications for this visit.   Allergies  Allergen Reactions  . Eggs Or Egg-Derived Products Nausea And Vomiting   History  Substance Use Topics  . Smoking status: Former Smoker -- 3.00 packs/day    Types: Cigarettes    Quit date: 02/11/1981  . Smokeless tobacco: Never Used  . Alcohol Use: 12.6 oz/week    21 Shots of liquor per week     Comment: daily     Review of Systems  HENT: Negative.   Respiratory: Negative.   Cardiovascular: Negative.   Gastrointestinal: Negative.   Genitourinary: Negative.   Musculoskeletal: Positive for back pain and arthralgias.        Objective:   Physical Exam BP 138/72  Pulse 72  Temp(Src) 97.8 F (36.6 C) (Temporal)  Resp 14  Ht 5' 8" (1.727 m)  Wt 176 lb 9.6 oz (80.105 kg)  BMI 26.86 kg/m2 General: Alert, well-developed Caucasian male, in no distress Skin: Warm and dry without rash or infection. HEENT: No palpable masses or thyromegaly. Sclera nonicteric. Pupils equal round and reactive. Oropharynx clear. Lymph nodes: No cervical, supraclavicular, or inguinal nodes palpable. Lungs: Breath sounds clear and equal without increased work of breathing Cardiovascular: Regular rate and rhythm without murmur. No JVD or edema. Peripheral pulses intact. Abdomen: Nondistended. Soft and nontender. No masses palpable. No organomegaly. No palpable hernias. Well-healed incision. Colostomy left lower quadrant Extremities: 1+ ankle edema or joint swelling or deformity. No chronic venous stasis changes. Neurologic: Alert and fully oriented. Gait normal.    Assessment:     Status post [name] colectomy for diverticulitis and abscess. He has done well without complication. Desires colostomy reversal. We discussed the nature of the surgery and expected recovery as well as risks of anesthetic complications, bleeding, infection anastomotic leak. He understands and desires to proceed.    Plan:     Takedown of [name] colostomy following mechanical and antibiotic bowel prep.      

## 2012-09-30 NOTE — Anesthesia Postprocedure Evaluation (Signed)
Anesthesia Post Note  Patient: [name].  Procedure(s) Performed: Procedure(s) (LRB): TAKEDOWN OF HARTMAN COLOSTOMY (N/A)  Anesthesia type: General  Patient location: PACU  Post pain: Pain level controlled  Post assessment: Post-op Vital signs reviewed  Last Vitals: BP 136/78  Pulse 84  Temp(Src) 36.3 C (Axillary)  Resp 13  Ht 5\' 9"  (1.753 m)  Wt 172 lb 9.6 oz (78.291 kg)  BMI 25.48 kg/m2  SpO2 100%  Post vital signs: Reviewed  Level of consciousness: sedated  Complications: No apparent anesthesia complications

## 2012-09-30 NOTE — Op Note (Signed)
Preoperative Diagnosis: status post colostomy  Postoprative Diagnosis: status post colostomy  Procedure: Procedure(s): TAKEDOWN OF HARTMAN COLOSTOMY with Takedown of Splenic Flexure   Surgeon: Glenna Fellows T   Assistants: Claud Kelp  Anesthesia:  General endotracheal anesthesia  Indications: patient is 6 months status post [name] colectomy/colostomy for severe sigmoid diverticulitis with abscess. He has done well and strongly desires colostomy takedown. We discussed the procedure and risks extensively detailed elsewhere. He is brought to the operating room for this procedure. He underwent a mechanical and antibiotic bowel prep at home.   Procedure Detail: patient was brought to the operating room, placed in the supine position on the operating table, and general endotracheal anesthesia induced. He was carefully positioned in lithotomy position on yellowfin stirrups carefully padded. He received preoperative IV antibiotics. PAS weret in place. I initially with sterile prep closed the left lower quadrant ostomy at the skin level with a pursestring silk suture. A Foley catheter had been placed. The perineum and abdomen were widely sterilely prepped and draped. Patient time out was performed and the procedure verified. The previous low midline incision was used excising the old scar and this was extended up somewhat above the umbilicus. Dissection was carried down to the subcutaneous tissue and midline fascia using cautery the peritoneum under direct vision. There were fortunately no adhesions to the anterior abdominal wall. There were very minimal small bowel adhesions up around the ostomy in the pelvis which were filmy and were taken down with sharp dissection and the small bowel packed and retracted into the upper abdomen. The ostomy was dissected up toward the abdominal wall and was divided at the level of the peritoneum with a single firing of the GIA 75 mm stapler. Following this the  left colon was extensively mobilized dividing lateral peritoneal attachments up toward the splenic flexure. We then turned our attention to the pelvis. The small bowel had been previously mobilized out of the pelvis. The rectal stump was identified. It was fairly low but several centimeters above the peritoneal reflection. It was mobilized somewhat dividing some lateral attachments and dissecting posteriorly a short way along the sacrum area staple line was identified and there appeared to be an adequate amount of healthy rectal wall around the staple line for anastomosis. At this point bringing the left colon down toward the rectum there was not enough length for a tension-free anastomosis and I elected to take down the splenic flexure. A Thompson retractor was placed for upper abdominal exposure. The omentum was elevated off the transverse colon and in the avascular area of the mid transverse colon the omentum was dissected up off the colon with cautery and the free lesser sac was entered. We then continued to mobilize the distal transverse colon off the omentum working up to the splenic flexure. The splenocolic ligament could then be completely isolated and was divided and the splenic flexure completely mobilized. We at this point had complete mobilization from the midtransverse colon down to the divided left colon. At this point the end of the bowel to come down into the pelvis without tension. A point for division of the left colon a centimeter or 2 proximal to the ostomy was chosen and this was cleaned of mesentery and pericolic fat with the harmonic scalpel. The pursestring clamp was then used to place a pursestring suture of 2-0 Prolene and the redundant distal colon trimmed away. The pursestring was symmetrical and intact. The end of the left colon was sized to a 29 mm  sizer and the 29 mm EEA was opened and the anvil placed and pursestring suture secured. Dr. Derrell Lolling then went below. Rigid sigmoidoscopy was  performed clearing the rectum up to the staple line. The 29 mm Stealth EEA stapler was then advanced through the anus up to the staple line of the rectal stump and the spike deployed through the center of the staple line. The anvil was attached and the stapler closed excluding any extraneous tissue. The stapler was fired and removed without difficulty. There were 2 thick complete tissue rings. Following this with the colon clamped above the anastomosis Dr. Derrell Lolling performed rigid sigmoidoscopy and with the rectum tensely distended with air under saline irrigation there was no evidence of leak from the anastomosis. With the retractors and packs removed there was no undue tension on the anastomosis. Following this the abdomen was carefully inspected for hemostasis which appeared complete. The entire bowel was run and there was no evidence of injury or other problems. The abdomen was thoroughly irrigated with saline. At this point all instruments gloves and gowns were changed and the wound was redraped. The midline fascia was closed with looped running #1 PDS begun the groin incision and tied centrally. The subcutaneous tissue was irrigated and the skin closed with staples. The midline wound was isolated and then elliptically transversely excised the ostomy from the skin dissecting it down through the level of the fascia and peritoneum and completely removed the remainder of the ostomy. This fascia was closed with interrupted #1 Novafil sutures. The subcutaneous was irrigated and the skin closed with staples. Sponge needle and instrument counts were correct.  Findings: As above  Estimated Blood Loss:  200 mL         Drains: none  Blood Given: none          Specimens: none        Complications:  * No complications entered in OR log *         Disposition: PACU - hemodynamically stable.         Condition: stable

## 2012-10-01 ENCOUNTER — Encounter (HOSPITAL_COMMUNITY): Payer: Self-pay | Admitting: General Surgery

## 2012-10-01 LAB — BASIC METABOLIC PANEL
BUN: 10 mg/dL (ref 6–23)
Calcium: 8.5 mg/dL (ref 8.4–10.5)
Chloride: 103 mEq/L (ref 96–112)
Creatinine, Ser: 0.9 mg/dL (ref 0.50–1.35)
GFR calc Af Amer: >90 mL/min (ref >90)
GFR calc non Af Amer: 83 mL/min — ABNORMAL LOW (ref >90)

## 2012-10-01 LAB — CBC
HCT: 36.3 % — ABNORMAL LOW (ref 39.0–52.0)
MCH: 33.7 pg (ref 26.0–34.0)
MCHC: 33.3 g/dL (ref 30.0–36.0)
MCV: 101.1 fL — ABNORMAL HIGH (ref 78.0–100.0)
Platelets: 101 10*3/uL — ABNORMAL LOW (ref 150–400)
RDW: 15.4 % (ref 11.5–15.5)
WBC: 10.3 10*3/uL (ref 4.0–10.5)

## 2012-10-01 MED ORDER — MORPHINE SULFATE 2 MG/ML IJ SOLN
2.0000 mg | INTRAMUSCULAR | Status: DC | PRN
  Administered 2012-10-01 – 2012-10-02 (×2): 2 mg via INTRAVENOUS
  Filled 2012-10-01 (×2): qty 1

## 2012-10-01 MED ORDER — DABIGATRAN ETEXILATE MESYLATE 150 MG PO CAPS
150.0000 mg | ORAL_CAPSULE | Freq: Two times a day (BID) | ORAL | Status: DC
  Administered 2012-10-01 – 2012-10-03 (×5): 150 mg via ORAL
  Filled 2012-10-01 (×8): qty 1

## 2012-10-01 NOTE — Progress Notes (Signed)
Patient ID: Martin Garnet., male   DOB: May 15, 1940, 72 y.o.   MRN: [id number] 1 Day Post-Op  Subjective: Only C/O is monitore beeping.  Good pain control. No nausea this AM.  Foley just removed  Objective: Vital signs in last 24 hours: Temp:  [97.3 F (36.3 C)-97.9 F (36.6 C)] 97.8 F (36.6 C) (08/21 0502) Pulse Rate:  [63-84] 78 (08/21 0502) Resp:  [11-18] 18 (08/21 0502) BP: (117-152)/(57-90) 132/75 mmHg (08/21 0502) SpO2:  [95 %-100 %] 97 % (08/21 0502) Weight:  [172 lb 9.6 oz (78.291 kg)] 172 lb 9.6 oz (78.291 kg) (08/20 1314)    Intake/Output from previous day: 08/20 0701 - 08/21 0700 In: 5091.7 [I.V.:5091.7] Out: 1570 [Urine:1370; Blood:200] Intake/Output this shift: Total I/O In: 1198.3 [I.V.:1198.3] Out: 1050 [Urine:1050]  General appearance: alert, cooperative and no distress Resp: clear to auscultation bilaterally GI: normal findings: soft, non-tender Incision/Wound: Dressings clean and dry  Lab Results:   Recent Labs  10/01/12 0418  WBC 10.3  HGB 12.1*  HCT 36.3*  PLT 101*   BMET  Recent Labs  10/01/12 0418  NA 136  K 3.9  CL 103  CO2 25  GLUCOSE 141*  BUN 10  CREATININE 0.90  CALCIUM 8.5     Studies/Results: No results found.  Anti-infectives: Anti-infectives   Start     Dose/Rate Route Frequency Ordered Stop   09/30/12 0600  cefoTEtan (CEFOTAN) 2 g in dextrose 5 % 50 mL IVPB     2 g 100 mL/hr over 30 Minutes Intravenous On call to O.R. 09/29/12 1918 09/30/12 0843      Assessment/Plan: s/p Procedure(s): TAKEDOWN OF HARTMAN COLOSTOMY Doing well Sart CL diet   LOS: 1 day    Sefora Tietje T 10/01/2012

## 2012-10-01 NOTE — Care Management Note (Signed)
    Page 1 of 1   10/01/2012     10:54:45 AM   CARE MANAGEMENT NOTE 10/01/2012  Patient:  [name], [name]   Account Number:  [id number]  Date Initiated:  10/01/2012  Documentation initiated by:  Lorenda Ishihara  Subjective/Objective Assessment:   72 yo male admitted s/p colostomy takedown. PTA lived at home with spouse.     Action/Plan:   Home when stable   Anticipated DC Date:  10/05/2012   Anticipated DC Plan:  HOME/SELF CARE      DC Planning Services  CM consult      Choice offered to / List presented to:             Status of service:  Completed, signed off Medicare Important Message given?   (If response is "NO", the following Medicare IM given date fields will be blank) Date Medicare IM given:   Date Additional Medicare IM given:    Discharge Disposition:  HOME/SELF CARE  Per UR Regulation:  Reviewed for med. necessity/level of care/duration of stay  If discussed at Long Length of Stay Meetings, dates discussed:    Comments:

## 2012-10-01 NOTE — Progress Notes (Signed)
Patient has been encouraged multiple times by myself, the tech, and other nurses to either ambulate in the hall or room, or sit in the chair. He has refused all, aside from working with PT today. Patient has also been encouraged to wear SCD's due to lack of activity. He has been informed of the benefits of preventing blood clots by wearing them, but patient refuses to wear them as well.  VSS, no other concerns.

## 2012-10-01 NOTE — Evaluation (Signed)
Physical Therapy Evaluation Patient Details Name: [name]. MRN: [id number] DOB: 1940/10/02 Today's Date: 10/01/2012 Time: 4098-1191 PT Time Calculation (min): 28 min  PT Assessment / Plan / Recommendation History of Present Illness  Admitted 09/30/12, now 6 months following [name] colectomy for severe diverticulitis with pericolonic abscess  He is still having therapy from his back surgery but gaining strength. Denies any abdominal complaints. S/p takedown of colostomy on 09/30/12.  Clinical Impression  Pt was up at bedside with wife. Pt was able to ambulate in room. Pt will benefit from PT while in acute care. May benefit from HHPT at DC.     PT Assessment  Patient needs continued PT services    Follow Up Recommendations  Home health PT    Does the patient have the potential to tolerate intense rehabilitation      Barriers to Discharge        Equipment Recommendations  None recommended by PT    Recommendations for Other Services     Frequency Min 3X/week    Precautions / Restrictions Precautions Precaution Comments: Abd surgical site.   Pertinent Vitals/Pain Abd. Discomfort, not rated.      Mobility  Bed Mobility Bed Mobility: Sit to Supine Sit to Supine: 5: Supervision;HOB elevated;With rail Details for Bed Mobility Assistance: cues for sitting high up in bed. Transfers Transfers: Sit to Stand;Stand to Sit Sit to Stand: 4: Min guard;From chair/3-in-1;With upper extremity assist Stand to Sit: 5: Supervision;To chair/3-in-1;To bed;With upper extremity assist Ambulation/Gait Ambulation/Gait Assistance: 4: Min guard Ambulation Distance (Feet): 20 Feet Assistive device: Rolling walker Ambulation/Gait Assistance Details: pt managed to walk backward  w/ RW. Gait is steady. Gait Pattern: Trunk flexed;Step-through pattern;Decreased stride length    Exercises     PT Diagnosis: Difficulty walking;Acute pain  PT Problem List: Decreased strength;Decreased  activity tolerance;Decreased mobility;Pain PT Treatment Interventions: DME instruction;Gait training;Functional mobility training;Therapeutic activities;Patient/family education     PT Goals(Current goals can be found in the care plan section) Acute Rehab PT Goals Patient Stated Goal: I want to walk tomorrow. This just wipes me out. PT Goal Formulation: With patient/family Time For Goal Achievement: 10/15/12 Potential to Achieve Goals: Good  Visit Information  Last PT Received On: 10/01/12 Assistance Needed: +1 History of Present Illness: Admitted 09/30/12, now 6 months following [name] colectomy for severe diverticulitis with pericolonic abscess  He is still having therapy from his back surgery but gaining strength. Denies any abdominal complaints. S/p takedown of colostomy on 09/30/12.       Prior Functioning  Home Living Family/patient expects to be discharged to:: Private residence Available Help at Discharge: Family Type of Home: House Home Access: Stairs to enter [employer] of Steps: 1 Home Layout: Two level;Full bath on main level;Able to live on main level with bedroom/bathroom Home Equipment: Dan Humphreys - 2 wheels Prior Function Level of Independence: Independent Communication Communication: No difficulties    Cognition  Cognition Arousal/Alertness: Awake/alert Behavior During Therapy: WFL for tasks assessed/performed Overall Cognitive Status: Within Functional Limits for tasks assessed    Extremity/Trunk Assessment Upper Extremity Assessment Upper Extremity Assessment: Overall WFL for tasks assessed Lower Extremity Assessment Lower Extremity Assessment: Generalized weakness   Balance Balance Balance Assessed: Yes Dynamic Standing Balance Dynamic Standing - Balance Support: No upper extremity supported Dynamic Standing - Level of Assistance: 5: Stand by assistance Dynamic Standing - Comments: Able to perform self care after toileting.  End of Session  PT - End of Session Activity Tolerance: Patient tolerated treatment well  Patient left: in bed;with call bell/phone within reach;with family/visitor present Nurse Communication: Mobility status  GP     Rada Hay 10/01/2012, 2:37 PM  Blanchard Kelch PT [phone]

## 2012-10-02 LAB — BASIC METABOLIC PANEL
BUN: 6 mg/dL (ref 6–23)
Chloride: 100 mEq/L (ref 96–112)
GFR calc Af Amer: >90 mL/min (ref >90)
GFR calc non Af Amer: 89 mL/min — ABNORMAL LOW (ref >90)
Potassium: 3.3 mEq/L — ABNORMAL LOW (ref 3.5–5.1)

## 2012-10-02 LAB — CBC
MCHC: 33.3 g/dL (ref 30.0–36.0)
Platelets: 109 10*3/uL — ABNORMAL LOW (ref 150–400)
RDW: 15.6 % — ABNORMAL HIGH (ref 11.5–15.5)
WBC: 10.2 10*3/uL (ref 4.0–10.5)

## 2012-10-02 NOTE — Progress Notes (Signed)
Physical Therapy Treatment Patient Details Name: [name]. MRN: [id number] DOB: 09-01-1940 Today's Date: 10/02/2012 Time: 4098-1191 PT Time Calculation (min): 17 min  PT Assessment / Plan / Recommendation  History of Present Illness Admitted 09/30/12, now 6 months following [name] colectomy for severe diverticulitis with pericolonic abscess  He is still having therapy from his back surgery but gaining strength. Denies any abdominal complaints. S/p takedown of colostomy on 09/30/12.   PT Comments   Pt was proud of his walk today. "Be sure to tell Dr. Johna Sheriff that I walked."  Follow Up Recommendations  Home health PT (may not need.)     Does the patient have the potential to tolerate intense rehabilitation     Barriers to Discharge        Equipment Recommendations  None recommended by PT    Recommendations for Other Services    Frequency Min 3X/week   Progress towards PT Goals Progress towards PT goals: Progressing toward goals  Plan Current plan remains appropriate    Precautions / Restrictions Precautions Precaution Comments: Abd surgical site.        Mobility  Bed Mobility Sit to Supine: 6: Modified independent (Device/Increase time);With rail Transfers Sit to Stand: 5: Supervision;From bed Stand to Sit: 5: Supervision;To bed Ambulation/Gait Ambulation/Gait Assistance: 4: Min guard Ambulation Distance (Feet): 400 Feet Assistive device: Rolling walker Ambulation/Gait Assistance Details: pt walked a  pretty good clip. No pain. Gait Pattern: Step-through pattern    Exercises     PT Diagnosis:    PT Problem List:   PT Treatment Interventions:     PT Goals (current goals can now be found in the care plan section)    Visit Information  Last PT Received On: 10/02/12 Assistance Needed: +1 History of Present Illness: Admitted 09/30/12, now 6 months following [name] colectomy for severe diverticulitis with pericolonic abscess  He is still having therapy  from his back surgery but gaining strength. Denies any abdominal complaints. S/p takedown of colostomy on 09/30/12.    Subjective Data      Cognition  Cognition Arousal/Alertness: Awake/alert    Balance     End of Session PT - End of Session Activity Tolerance: Patient tolerated treatment well Patient left: in bed;with call bell/phone within reach;with family/visitor present Nurse Communication: Mobility status   GP     Rada Hay 10/02/2012, 4:55 PM Blanchard Kelch PT [phone]

## 2012-10-02 NOTE — Progress Notes (Signed)
Patient ID: Eugene Garnet., male   DOB: 1940-09-15, 72 y.o.   MRN: [id number] 2 Days Post-Op  Subjective: Incisional pain when moving but otherwise feels well.  Had two loose BMs.  Voiding OK.  No nausea with CL diet  Objective: Vital signs in last 24 hours: Temp:  [97.8 F (36.6 C)-98.7 F (37.1 C)] 98.1 F (36.7 C) (08/22 0458) Pulse Rate:  [78-87] 87 (08/22 0458) Resp:  [16-18] 16 (08/22 0458) BP: (131-159)/(72-82) 159/82 mmHg (08/22 0458) SpO2:  [94 %-99 %] 96 % (08/22 0458) Last BM Date: 09/30/12  Intake/Output from previous day: 08/21 0701 - 08/22 0700 In: -  Out: 1425 [Urine:1425] Intake/Output this shift:    General appearance: alert, cooperative and no distress GI: normal findings: soft, non-tender, active BS Incision/Wound: Clean and dry  Lab Results:   Recent Labs  10/01/12 0418 10/02/12 0435  WBC 10.3 10.2  HGB 12.1* 11.8*  HCT 36.3* 35.4*  PLT 101* 109*   BMET  Recent Labs  10/01/12 0418 10/02/12 0435  NA 136 131*  K 3.9 3.3*  CL 103 100  CO2 25 24  GLUCOSE 141* 137*  BUN 10 6  CREATININE 0.90 0.76  CALCIUM 8.5 8.5     Studies/Results: No results found.  Anti-infectives: Anti-infectives   Start     Dose/Rate Route Frequency Ordered Stop   09/30/12 0600  cefoTEtan (CEFOTAN) 2 g in dextrose 5 % 50 mL IVPB     2 g 100 mL/hr over 30 Minutes Intravenous On call to O.R. 09/29/12 1918 09/30/12 0843      Assessment/Plan: s/p Procedure(s): TAKEDOWN OF HARTMAN COLOSTOMY Doing very well without complication  FL diet, decrease IVF   LOS: 2 days    Dia Jefferys T 10/02/2012

## 2012-10-03 LAB — BASIC METABOLIC PANEL
BUN: 6 mg/dL (ref 6–23)
Chloride: 98 mEq/L (ref 96–112)
GFR calc Af Amer: >90 mL/min (ref >90)
GFR calc non Af Amer: 87 mL/min — ABNORMAL LOW (ref >90)
Glucose, Bld: 114 mg/dL — ABNORMAL HIGH (ref 70–99)
Potassium: 3 mEq/L — ABNORMAL LOW (ref 3.5–5.1)
Sodium: 132 mEq/L — ABNORMAL LOW (ref 135–145)

## 2012-10-03 LAB — CBC
HCT: 35 % — ABNORMAL LOW (ref 39.0–52.0)
Hemoglobin: 11.4 g/dL — ABNORMAL LOW (ref 13.0–17.0)
MCHC: 32.6 g/dL (ref 30.0–36.0)
RDW: 15.5 % (ref 11.5–15.5)
WBC: 8.8 10*3/uL (ref 4.0–10.5)

## 2012-10-03 MED ORDER — POTASSIUM CHLORIDE 10 MEQ/100ML IV SOLN
10.0000 meq | INTRAVENOUS | Status: AC
  Administered 2012-10-03 (×2): 10 meq via INTRAVENOUS
  Filled 2012-10-03 (×2): qty 100

## 2012-10-03 MED ORDER — POTASSIUM CHLORIDE CRYS ER 20 MEQ PO TBCR
20.0000 meq | EXTENDED_RELEASE_TABLET | Freq: Two times a day (BID) | ORAL | Status: DC
  Administered 2012-10-03 (×2): 20 meq via ORAL
  Filled 2012-10-03 (×4): qty 1

## 2012-10-03 NOTE — Progress Notes (Signed)
Courtesy note-patient in good spirits, status post takedown of [name] colostomy, progressing well, advanced diet today, very appreciative of surgery.

## 2012-10-03 NOTE — Progress Notes (Signed)
Patient ID: Martin Garnet., male   DOB: [birth year], 72 y.o.   MRN: [id number] 3 Days Post-Op  Subjective: No complaints. Tolerating full liquids. States he is hungry and wants food. He has passed gas several times and had mucus per rectum. Ambulating well.  Objective: Vital signs in last 24 hours: Temp:  [97.8 F (36.6 C)-98 F (36.7 C)] 97.8 F (36.6 C) (08/23 0600) Pulse Rate:  [63-76] 76 (08/23 0600) Resp:  [18] 18 (08/23 0600) BP: (148-150)/(79-90) 150/79 mmHg (08/23 0600) SpO2:  [94 %-99 %] 94 % (08/23 0600) Last BM Date: 10/02/12  Intake/Output from previous day: 08/22 0701 - 08/23 0700 In: 1253.3 [P.O.:240; I.V.:1013.3] Out: 2125 [Urine:2125] Intake/Output this shift:    General appearance: alert, cooperative and no distress GI: normal findings: soft, non-tender and mild distention and active bowel sounds Incision/Wound: dressing clean and dry without drainage and no erythema  Lab Results:   Recent Labs  10/02/12 0435 10/03/12 0423  WBC 10.2 8.8  HGB 11.8* 11.4*  HCT 35.4* 35.0*  PLT 109* 116*   BMET  Recent Labs  10/02/12 0435 10/03/12 0423  NA 131* 132*  K 3.3* 3.0*  CL 100 98  CO2 24 26  GLUCOSE 137* 114*  BUN 6 6  CREATININE 0.76 0.81  CALCIUM 8.5 8.6     Studies/Results: No results found.  Anti-infectives: Anti-infectives   Start     Dose/Rate Route Frequency Ordered Stop   09/30/12 0600  cefoTEtan (CEFOTAN) 2 g in dextrose 5 % 50 mL IVPB     2 g 100 mL/hr over 30 Minutes Intravenous On call to O.R. 09/29/12 1918 09/30/12 0843      Assessment/Plan: s/p Procedure(s): TAKEDOWN OF HARTMAN COLOSTOMY Generally doing well. Advanced to regular diet. Continue ambulation as he is doing. Hypokalemia. Replaced today. Lab in a.m.   LOS: 3 days    Raheem Kolbe T 10/03/2012

## 2012-10-04 LAB — CBC
HCT: 37.1 % — ABNORMAL LOW (ref 39.0–52.0)
Hemoglobin: 12.2 g/dL — ABNORMAL LOW (ref 13.0–17.0)
RBC: 3.67 MIL/uL — ABNORMAL LOW (ref 4.22–5.81)

## 2012-10-04 LAB — BASIC METABOLIC PANEL
BUN: 5 mg/dL — ABNORMAL LOW (ref 6–23)
CO2: 28 mEq/L (ref 19–32)
Glucose, Bld: 112 mg/dL — ABNORMAL HIGH (ref 70–99)
Potassium: 3.3 mEq/L — ABNORMAL LOW (ref 3.5–5.1)
Sodium: 135 mEq/L (ref 135–145)

## 2012-10-04 MED ORDER — OXYCODONE-ACETAMINOPHEN 5-325 MG PO TABS: 1.0000 | ORAL_TABLET | ORAL | Status: DC | PRN

## 2012-10-04 NOTE — Discharge Summary (Signed)
   Patient ID: Katrell Milhorn [id number] 72 y.o. October 13, 1940  09/30/2012  Discharge date and time: 10/04/2012   Admitting Physician: Glenna Fellows T  Discharge Physician: Glenna Fellows T  Admission Diagnoses: status post colostomy  Discharge Diagnoses: Status post Gertie Gowda colectomy/colostomy  Operations: Procedure(s): TAKEDOWN OF HARTMAN COLOSTOMY  Admission Condition: good  Discharged Condition: good  Indication for Admission: patient is a 72 year old male 6 months following urgent [name] colectomy and colostomy for perforated diverticulitis with abscess. He has done well and desires colostomy takedown. Following a mechanical and antibiotic bowel prep at home he is electively admitted on the morning of his surgery for colostomy takedown.  Hospital Course: the patient underwent an uneventful colostomy takedown. His postoperative course was smooth. Pain was well-controlled. He had no nausea. He was started on clear liquid diet on the first postoperative day which he tolerated well. He began passing flatus and having some mucous bowel movements on the third postoperative day. His diet was advanced. By the fourth postoperative day he is having somewhat frequent loose bowel movements but manageable. No double pain. Tolerating regular diet. His abdomen is soft and nontender. Incisions are healing nicely without infection. He had some hypokalemia that responded to replacement. CBC is normal. He is felt ready for discharge.  Disposition: Home  Patient Instructions:    Medication List    STOP taking these medications       NULYTELY WITH FLAVOR PACKS 420 G solution  Generic drug:  polyethylene glycol-electrolytes      TAKE these medications       allopurinol 100 MG tablet  Commonly known as:  ZYLOPRIM  Take 100 mg by mouth every morning.     amLODipine 5 MG tablet  Commonly known as:  NORVASC  Take 5 mg by mouth every morning.     colchicine 0.6 MG tablet  Take  0.6 mg by mouth daily as needed (For gout.).     dabigatran 150 MG Caps capsule  Commonly known as:  PRADAXA  Take 1 capsule (150 mg total) by mouth every 12 (twelve) hours.     ferrous sulfate 325 (65 FE) MG tablet  Take 325 mg by mouth every morning.     omeprazole 40 MG capsule  Commonly known as:  PRILOSEC  Take 40 mg by mouth every evening.     oxyCODONE-acetaminophen 5-325 MG per tablet  Commonly known as:  PERCOCET/ROXICET  Take 1-2 tablets by mouth every 4 (four) hours as needed.     rosuvastatin 20 MG tablet  Commonly known as:  CRESTOR  Take 20 mg by mouth 2 (two) times a week. He takes on Monday and Friday.     VITAMIN D (CHOLECALCIFEROL) PO  Take 1,000 Units by mouth daily.      ASK your doctor about these medications       metoprolol succinate 100 MG 24 hr tablet  Commonly known as:  TOPROL-XL  Take 100 mg by mouth daily after breakfast.        Activity: no heavy lifting for 4 weeks Diet: regular diet Wound Care: none needed  Follow-up:  With Dr. Johna Sheriff in 10 days.  Signed: Mariella Saa MD, FACS  10/04/2012, 8:40 AM

## 2012-10-04 NOTE — Plan of Care (Signed)
Problem: Discharge Progression Outcomes Goal: Staples/sutures removed Outcome: Not Met (add Reason) TO BE DONE IN MD OFFICE POST OP

## 2012-10-04 NOTE — Progress Notes (Signed)
Patient ID: Martin Garnet., male   DOB: 1940-07-25, 72 y.o.   MRN: [id number] 4 Days Post-Op  Subjective: No complaints this morning. Has had a number of loose bowel movements. No nausea. Pain is improved.  Objective: Vital signs in last 24 hours: Temp:  [98 F (36.7 C)-98.6 F (37 C)] 98 F (36.7 C) (08/24 0540) Pulse Rate:  [83-96] 83 (08/24 0540) Resp:  [18] 18 (08/24 0540) BP: (147-160)/(72-79) 152/79 mmHg (08/24 0540) SpO2:  [95 %-96 %] 96 % (08/24 0540) Last BM Date: 10/05/12  Intake/Output from previous day: 08/23 0701 - 08/24 0700 In: 1748.3 [P.O.:360; I.V.:1188.3; IV Piggyback:200] Out: 1925 [Urine:1925] Intake/Output this shift:    General appearance: alert, cooperative and no distress GI: soft and nontender and nondistended Incision/Wound: clean and dry without drainage or erythema  Lab Results:   Recent Labs  10/03/12 0423 10/04/12 0420  WBC 8.8 6.9  HGB 11.4* 12.2*  HCT 35.0* 37.1*  PLT 116* 136*   BMET  Recent Labs  10/03/12 0423 10/04/12 0420  NA 132* 135  K 3.0* 3.3*  CL 98 99  CO2 26 28  GLUCOSE 114* 112*  BUN 6 5*  CREATININE 0.81 0.84  CALCIUM 8.6 9.0     Studies/Results: No results found.  Anti-infectives: Anti-infectives   Start     Dose/Rate Route Frequency Ordered Stop   09/30/12 0600  cefoTEtan (CEFOTAN) 2 g in dextrose 5 % 50 mL IVPB     2 g 100 mL/hr over 30 Minutes Intravenous On call to O.R. 09/29/12 1918 09/30/12 0843      Assessment/Plan: s/p Procedure(s): TAKEDOWN OF HARTMAN COLOSTOMY Doing very well. Okay for discharge.   LOS: 4 days    Waddell Iten T 10/04/2012

## 2012-10-06 ENCOUNTER — Telehealth: Payer: Self-pay | Admitting: Internal Medicine

## 2012-10-06 NOTE — Telephone Encounter (Signed)
Pt aware and samples left up front for pickup. 

## 2012-10-06 NOTE — Telephone Encounter (Signed)
Pt had his colostomy reversed 09/30/12 by Dr. Johna Sheriff. Pt states he has been having diarrhea since the surgery. Per pt Dr. Johna Sheriff told pt that the diarrhea would improve and eventually go away and pt states it has gotten better. Pt reports that his bottom is very red, sore, and uncomfortable. Pt wants to know if we have any advise regarding bottom. Would Resinol be an option for pt? We have samples. Please advise.

## 2012-10-06 NOTE — Telephone Encounter (Signed)
Yes, thanks

## 2012-10-14 ENCOUNTER — Telehealth: Payer: Self-pay | Admitting: Oncology

## 2012-10-14 NOTE — Telephone Encounter (Signed)
Pt cancelled appts and does not want to reschedule

## 2012-10-16 ENCOUNTER — Ambulatory Visit (INDEPENDENT_AMBULATORY_CARE_PROVIDER_SITE_OTHER): Payer: Managed Care, Other (non HMO) | Admitting: General Surgery

## 2012-10-16 ENCOUNTER — Encounter (INDEPENDENT_AMBULATORY_CARE_PROVIDER_SITE_OTHER): Payer: Self-pay | Admitting: General Surgery

## 2012-10-16 VITALS — BP 132/64 | HR 70 | Resp 18 | Ht 68.0 in | Wt 172.0 lb

## 2012-10-16 DIAGNOSIS — Z09 Encounter for follow-up examination after completed treatment for conditions other than malignant neoplasm: Secondary | ICD-10-CM

## 2012-10-16 NOTE — Patient Instructions (Addendum)
No lifting or strenuous exercise for 3 more weeks and then begin very cautiously and slowly

## 2012-10-16 NOTE — Progress Notes (Signed)
History: Patient returns approximately 2 weeks following colostomy takedown. He is getting along quite well. Bowel movements are slowing down and he has no trouble with continence or extreme urgency. No fever or abdominal pain or wound concerns. Energy is increasing.  Exam: BP 132/64  Pulse 70  Resp 18  Ht 5\' 8"  (1.727 m)  Wt 172 lb (78.019 kg)  BMI 26.16 kg/m2 General: Appears well Abdomen: Soft and nontender. Wounds are well healed. I removed the staples and Steri-Stripped his wound.  Assessment and plan: Doing very well following [name] colostomy takedown with history of sigmoid colectomy for diverticulitis with abscess. We discussed activity limitations over the next few weeks Will see him back in one month for what should be a final check.

## 2012-11-12 ENCOUNTER — Ambulatory Visit (INDEPENDENT_AMBULATORY_CARE_PROVIDER_SITE_OTHER): Payer: Managed Care, Other (non HMO) | Admitting: General Surgery

## 2012-11-12 ENCOUNTER — Encounter (INDEPENDENT_AMBULATORY_CARE_PROVIDER_SITE_OTHER): Payer: Self-pay | Admitting: General Surgery

## 2012-11-12 VITALS — BP 127/82 | HR 66 | Temp 98.6°F | Resp 12 | Ht 68.0 in | Wt 173.4 lb

## 2012-11-12 DIAGNOSIS — Z09 Encounter for follow-up examination after completed treatment for conditions other than malignant neoplasm: Secondary | ICD-10-CM

## 2012-11-12 NOTE — Progress Notes (Signed)
Chief complaint: Followup colostomy takedown  History: Patient returns for more long-term followup status post takedown of his Gertie Gowda colostomy done for perforated diverticulitis with chronic abscess. He is getting along very well. Bowel movements have returned to essentially normal with just some slight frequency. No palpable pain or fever. No difficulty with his incision.  Exam: BP 127/82  Pulse 66  Temp(Src) 98.6 F (37 C) (Temporal)  Resp 12  Ht 5\' 8"  (1.727 m)  Wt 173 lb 6.4 oz (78.654 kg)  BMI 26.37 kg/m2 General: Appears well Abdomen: Soft and nontender. Incisions well-healed and no evidence of hernia or other complication.  Assessment and plan: Doing very well following colostomy takedown without complication identified. There are no activity limitations. He will return as needed.

## 2012-11-12 NOTE — Patient Instructions (Signed)
No limits on physical activity

## 2012-11-16 ENCOUNTER — Encounter (INDEPENDENT_AMBULATORY_CARE_PROVIDER_SITE_OTHER): Payer: Self-pay

## 2012-12-14 ENCOUNTER — Other Ambulatory Visit: Payer: Managed Care, Other (non HMO) | Admitting: Lab

## 2012-12-17 ENCOUNTER — Other Ambulatory Visit: Payer: Self-pay

## 2012-12-21 ENCOUNTER — Ambulatory Visit: Payer: Managed Care, Other (non HMO) | Admitting: Oncology

## 2012-12-28 ENCOUNTER — Other Ambulatory Visit (HOSPITAL_COMMUNITY): Payer: Self-pay | Admitting: Internal Medicine

## 2012-12-28 ENCOUNTER — Ambulatory Visit (INDEPENDENT_AMBULATORY_CARE_PROVIDER_SITE_OTHER): Payer: Managed Care, Other (non HMO) | Admitting: Vascular Surgery

## 2012-12-28 ENCOUNTER — Encounter: Payer: Self-pay | Admitting: Vascular Surgery

## 2012-12-28 ENCOUNTER — Ambulatory Visit (HOSPITAL_COMMUNITY)
Admission: RE | Admit: 2012-12-28 | Discharge: 2012-12-28 | Disposition: A | Discharge status: Home / Self Care | Payer: Managed Care, Other (non HMO) | Source: Ambulatory Visit | Attending: Vascular Surgery | Admitting: Vascular Surgery

## 2012-12-28 VITALS — BP 177/82 | HR 90 | Resp 16 | Ht 68.0 in | Wt 181.0 lb

## 2012-12-28 DIAGNOSIS — I83893 Varicose veins of bilateral lower extremities with other complications: Secondary | ICD-10-CM

## 2012-12-28 DIAGNOSIS — R229 Localized swelling, mass and lump, unspecified: Secondary | ICD-10-CM

## 2012-12-28 DIAGNOSIS — R2242 Localized swelling, mass and lump, left lower limb: Secondary | ICD-10-CM

## 2012-12-28 DIAGNOSIS — R2240 Localized swelling, mass and lump, unspecified lower limb: Secondary | ICD-10-CM | POA: Insufficient documentation

## 2012-12-28 NOTE — Progress Notes (Signed)
Subjective:     Patient ID: Martin Garnet., male   DOB: July 13, 1940, 72 y.o.   MRN: [id number]  HPI this 72 year old male is referred for evaluation for acute thrombophlebitis left leg. He has a long history of varicose veins in both lower extremities left greater than right. Today he noted a "knot" in his left proximal calf which was nontender and he is not noticed in the past. He has no history of DVT, thrombophlebitis, stasis ulcers, bleeding. He does have bilateral lower extremity edema which worsens as the day progresses with some discomfort in both calves. He takes Predaxa for chronic atrial fibrillation. He has no history of embolic events.  Past Medical History  Diagnosis Date  . CAD (coronary artery disease)   . Hyperlipidemia   . Chronic atrial fibrillation     Dr. Clifton Richy Spradley  . Anemia   . Hypertension   . Hemorrhoids   . Duodenal ulcer   . History of GI bleed   . Heart murmur   . History of blood transfusion   . Myocardial infarction     history of. 1983  . Heart attack   . Renal insufficiency   . Gout   . Difficult intubation 2005    2014 surgeries no intubation problems with  . AAA (abdominal aortic aneurysm) 02-25-2011    US abdomen 3.4 cm x 3 cm last US done per pt    History  Substance Use Topics  . Smoking status: Former Smoker -- 3.00 packs/day for 25 years    Types: Cigarettes    Quit date: 02/11/1981  . Smokeless tobacco: Never Used  . Alcohol Use: 12.6 oz/week    21 Shots of liquor per week     Comment: daily 2-3 per day    Family History  Problem Relation Age of Onset  . Heart attack Father   . Heart disease Father   . Colon cancer Neg Hx     Allergies  Allergen Reactions  . Eggs Or Egg-Derived Products Nausea And Vomiting    Current outpatient prescriptions:allopurinol (ZYLOPRIM) 100 MG tablet, Take 100 mg by mouth every morning. , Disp: , Rfl: ;  amLODipine (NORVASC) 5 MG tablet, Take 5 mg by mouth every morning. , Disp: , Rfl: ;   chlorhexidine (PERIDEX) 0.12 % solution, , Disp: , Rfl: ;  colchicine 0.6 MG tablet, Take 0.6 mg by mouth daily as needed (For gout.). , Disp: , Rfl:  dabigatran (PRADAXA) 150 MG CAPS, Take 1 capsule (150 mg total) by mouth every 12 (twelve) hours., Disp: 60 capsule, Rfl: 6;  ferrous sulfate 325 (65 FE) MG tablet, Take 325 mg by mouth every morning. , Disp: , Rfl: ;  neomycin (MYCIFRADIN) 500 MG tablet, , Disp: , Rfl: ;  omeprazole (PRILOSEC) 40 MG capsule, Take 40 mg by mouth every evening. , Disp: , Rfl:  oxyCODONE-acetaminophen (PERCOCET/ROXICET) 5-325 MG per tablet, Take 1-2 tablets by mouth every 4 (four) hours as needed., Disp: 30 tablet, Rfl: 0;  rosuvastatin (CRESTOR) 20 MG tablet, Take 20 mg by mouth 2 (two) times a week. He takes on Monday and Friday., Disp: , Rfl: ;  metoprolol succinate (TOPROL-XL) 100 MG 24 hr tablet, Take 100 mg by mouth daily after breakfast. , Disp: , Rfl:   BP 177/82  Pulse 90  Resp 16  Ht 5\' 8"  (1.727 m)  Wt 181 lb (82.101 kg)  BMI 27.53 kg/m2  Body mass index is 27.53 kg/(m^2).  Review of Systems denies chest pain, dyspnea on exertion, PND, orthopnea, hemoptysis. Does have a history of diverticulitis which required colonic resection and colostomy with takedown of colostomy one month ago by Dr. Johna Sheriff and is doing well from that standpoint . Also patient has history of bilateral carotid endarterectomies and remote history of coronary artery disease with PTCA performed in 1979 and 1983. Has no recurrent cardiac symptoms. All other systems negative and complete review of systems    Objective:   Physical Exam BP 177/82  Pulse 90  Resp 16  Ht 5\' 8"  (1.727 m)  Wt 181 lb (82.101 kg)  BMI 27.53 kg/m2  Gen.-alert and oriented x3 in no apparent distress HEENT normal for age Lungs no rhonchi or wheezing Cardiovascular regular rhythm no murmurs carotid pulses 3+ palpable no bruits audible Abdomen soft nontender no palpable masses Musculoskeletal  free of  major deformities Skin clear -no rashes Neurologic normal Lower extremities 3+ femoral and dorsalis pedis pulses palpable bilaterally with bilateral 1+ edema. Left leg with bulging varicosities in the medial thigh and medial calf with palpable thrombosed varix measuring 1-1/2-2 cm in diameter in proximal medial calf over great saphenous vein. Extensive network of reticular and spider veins in the malleolar area. No active ulcer noted. Right leg with bulging varicosities and medial thigh and medial calf over great saphenous system with 1+ edema and extensive reticular and spider veins in ankle area.  Today I ordered left leg venous duplex exam which I reviewed and interpreted. There is no DVT. There is superficial thrombophlebitis and branches of the left great saphenous vein in the left great saphenous vein has gross reflux throughout supplying these bulging varicosities.       Assessment:     Severe varicose veins bilaterally with symptoms of pain and acute onset thrombophlebitis left leg Patient on chronic anti-coagulation-Pradaxa for atrial fibrillation    Plan:         #1 long leg elastic compression stockings 20-30 mm gradient #2 elevate legs as much as possible #3 ibuprofen daily on a regular basis for pain #4 return in 3 months-if no significant improvement then patient will need laser ablation left great saphenous vein to be followed by 3 months of observation and possible stab phlebectomy secondary varicosities left leg Patient will also have formal venous duplex exam of right leg on return to see if laser ablation right leg is indicated

## 2013-01-06 ENCOUNTER — Other Ambulatory Visit: Payer: Self-pay

## 2013-01-06 DIAGNOSIS — I83893 Varicose veins of bilateral lower extremities with other complications: Secondary | ICD-10-CM

## 2013-01-11 ENCOUNTER — Encounter: Payer: Managed Care, Other (non HMO) | Admitting: Vascular Surgery

## 2013-02-15 ENCOUNTER — Telehealth: Payer: Self-pay | Admitting: Internal Medicine

## 2013-02-15 NOTE — Telephone Encounter (Signed)
Pt called to thank Dr. Henrene Pastor for all of his help and advice. States he is doing much better and Dr. Henrene Pastor was right with everything he told him and suggested. Pt also states that Dr. Henrene Pastor had placed him on Prilosec 40mg  daily. Pt states he is fine taking it but isn't sure if he really needs to be taking it still. Please advise.

## 2013-02-15 NOTE — Telephone Encounter (Signed)
Please continue once daily to protect stomach

## 2013-02-15 NOTE — Telephone Encounter (Signed)
Pt aware.

## 2013-04-02 ENCOUNTER — Encounter: Payer: Self-pay | Admitting: Vascular Surgery

## 2013-04-05 ENCOUNTER — Ambulatory Visit (INDEPENDENT_AMBULATORY_CARE_PROVIDER_SITE_OTHER): Payer: Managed Care, Other (non HMO) | Admitting: Vascular Surgery

## 2013-04-05 ENCOUNTER — Encounter: Payer: Self-pay | Admitting: Vascular Surgery

## 2013-04-05 ENCOUNTER — Ambulatory Visit (HOSPITAL_COMMUNITY)
Admission: RE | Admit: 2013-04-05 | Discharge: 2013-04-05 | Disposition: A | Discharge status: Home / Self Care | Payer: Managed Care, Other (non HMO) | Source: Ambulatory Visit | Attending: Vascular Surgery | Admitting: Vascular Surgery

## 2013-04-05 VITALS — BP 154/85 | HR 73 | Resp 16 | Ht 68.0 in | Wt 183.0 lb

## 2013-04-05 DIAGNOSIS — I872 Venous insufficiency (chronic) (peripheral): Secondary | ICD-10-CM | POA: Insufficient documentation

## 2013-04-05 DIAGNOSIS — I83893 Varicose veins of bilateral lower extremities with other complications: Secondary | ICD-10-CM

## 2013-04-05 NOTE — Progress Notes (Signed)
Subjective:     Patient ID: Martin Dan., male   DOB: 11-30-1940, 73 y.o.   MRN: [id number]  HPI this 73 year old male returns for continued followup regarding his severe varicose veins in both lower extremity which are painful. He has tried long-leg elastic compression stockings, elevation, and ibuprofen with no improvement in his symptomatology. He continues to have edema which worsens as the day progresses. This is affecting his daily living.  Past Medical History  Diagnosis Date  . CAD (coronary artery disease)   . Hyperlipidemia   . Chronic atrial fibrillation     Dr. Angelena Form  . Anemia   . Hypertension   . Hemorrhoids   . Duodenal ulcer   . History of GI bleed   . Heart murmur   . History of blood transfusion   . Myocardial infarction     history of. 1983  . Heart attack   . Renal insufficiency   . Gout   . Difficult intubation 2005    2014 surgeries no intubation problems with  . AAA (abdominal aortic aneurysm) 02-25-2011    US abdomen 3.4 cm x 3 cm last US done per pt    History  Substance Use Topics  . Smoking status: Former Smoker -- 3.00 packs/day for 25 years    Types: Cigarettes    Quit date: 02/11/1981  . Smokeless tobacco: Never Used  . Alcohol Use: 12.6 oz/week    21 Shots of liquor per week     Comment: daily 2-3 per day    Family History  Problem Relation Age of Onset  . Heart attack Father   . Heart disease Father   . Colon cancer Neg Hx     Allergies  Allergen Reactions  . Eggs Or Egg-Derived Products Nausea And Vomiting    Current outpatient prescriptions:allopurinol (ZYLOPRIM) 100 MG tablet, Take 100 mg by mouth every morning. , Disp: , Rfl: ;  amLODipine (NORVASC) 5 MG tablet, Take 5 mg by mouth every morning. , Disp: , Rfl: ;  chlorhexidine (PERIDEX) 0.12 % solution, , Disp: , Rfl: ;  colchicine 0.6 MG tablet, Take 0.6 mg by mouth daily as needed (For gout.). , Disp: , Rfl:  dabigatran (PRADAXA) 150 MG CAPS, Take 1 capsule (150 mg  total) by mouth every 12 (twelve) hours., Disp: 60 capsule, Rfl: 6;  ferrous sulfate 325 (65 FE) MG tablet, Take 325 mg by mouth every morning. , Disp: , Rfl: ;  metoprolol succinate (TOPROL-XL) 100 MG 24 hr tablet, Take 100 mg by mouth daily after breakfast. , Disp: , Rfl: ;  neomycin (MYCIFRADIN) 500 MG tablet, , Disp: , Rfl:  omeprazole (PRILOSEC) 40 MG capsule, Take 40 mg by mouth every evening. , Disp: , Rfl: ;  oxyCODONE-acetaminophen (PERCOCET/ROXICET) 5-325 MG per tablet, Take 1-2 tablets by mouth every 4 (four) hours as needed., Disp: 30 tablet, Rfl: 0;  rosuvastatin (CRESTOR) 20 MG tablet, Take 20 mg by mouth 2 (two) times a week. He takes on Monday and Friday., Disp: , Rfl:   BP 154/85  Pulse 73  Resp 16  Ht 5\' 8"  (1.727 m)  Wt 183 lb (83.008 kg)  BMI 27.83 kg/m2  Body mass index is 27.83 kg/(m^2).          Review of Systems last chest pain, dyspnea on exertion, PND, orthopnea. Also denies hemoptysis. Does have chronic atrial fibrillation and is on Pradaxa    Objective:   Physical Exam BP 154/85  Pulse 73  Resp 16  Ht 5\' 8"  (1.727 m)  Wt 183 lb (83.008 kg)  BMI 27.83 kg/m2  General well-developed well-nourished male no apparent stress alert and oriented x3 Both lower extremities have bulging varicosities beginning in the distal thigh extending in the medial calf with 1+ edema and extensive reticular veins around the ankle areas in the medial malleolar spot. No hyperpigmentation or ulcerations noted. 3+ dorsalis pedis pulse palpable bilaterally.  Today I ordered venous duplex exam of the right leg jar reviewed and interpreted. 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 He will return in 3 months to see if stab phlebectomy of residual painful varicosities will be indicated. Will stop Pradaxa 5 days prior to procedure and then resume post procedure

## 2013-04-07 ENCOUNTER — Other Ambulatory Visit: Payer: Self-pay | Admitting: *Deleted

## 2013-04-07 DIAGNOSIS — I83893 Varicose veins of bilateral lower extremities with other complications: Secondary | ICD-10-CM

## 2013-04-08 ENCOUNTER — Other Ambulatory Visit: Payer: Self-pay | Admitting: Cardiovascular Disease

## 2013-04-10 ENCOUNTER — Encounter: Payer: Self-pay | Admitting: *Deleted

## 2013-04-10 ENCOUNTER — Telehealth: Payer: Self-pay | Admitting: *Deleted

## 2013-04-10 NOTE — Telephone Encounter (Signed)
Former pt of Dr. Darnell Level. Letter printed

## 2013-04-16 ENCOUNTER — Encounter: Payer: Self-pay | Admitting: Vascular Surgery

## 2013-04-19 ENCOUNTER — Ambulatory Visit (INDEPENDENT_AMBULATORY_CARE_PROVIDER_SITE_OTHER): Payer: Managed Care, Other (non HMO) | Admitting: Vascular Surgery

## 2013-04-19 ENCOUNTER — Encounter: Payer: Self-pay | Admitting: Vascular Surgery

## 2013-04-19 VITALS — BP 159/89 | HR 89 | Resp 18 | Ht 68.0 in | Wt 183.0 lb

## 2013-04-19 DIAGNOSIS — I83893 Varicose veins of bilateral lower extremities with other complications: Secondary | ICD-10-CM | POA: Diagnosis not present

## 2013-04-19 NOTE — Progress Notes (Signed)
Subjective:     Patient ID: Martin Dan., male   DOB: Sep 23, 1940, 73 y.o.   MRN: [id number]  HPI 73 year old male had laser ablation left great saphenous vein from the knee to near the saphenofemoral junction performed under local tumescent anesthesia venous hypertension with painful varicosities. A total of 1921 J of energy was utilized. He tolerated the procedure well.   Review of Systems     Objective:   Physical Exam BP 159/89  Pulse 89  Resp 18  Ht 5\' 8"  (1.727 m)  Wt 183 lb (83.008 kg)  BMI 27.83 kg/m2       Assessment:     Well-tolerated laser ablation left great saphenous vein performed under local tumescent anesthesia for painful varicosities with venous hypertension    Plan:     Return in one week for venous duplex exam to confirm closure left great saphenous vein We'll then schedule for similar procedure on the contralateral right leg

## 2013-04-19 NOTE — Progress Notes (Signed)
   Laser Ablation Procedure      Date: 04/19/2013    [name]. DOB:1941-02-12  Consent signed: Yes  Surgeon:J.D. Kellie Simmering  Procedure: Laser Ablation: left Greater Saphenous Vein  BP 159/89  Pulse 89  Resp 18  Ht 5\' 8"  (1.727 m)  Wt 183 lb (83.008 kg)  BMI 27.83 kg/m2  Start time: 10:50   End time: 11:20  Tumescent Anesthesia: 325 cc 0.9% NaCl with 50 cc Lidocaine HCL with 1% Epi and 15 cc 8.4% NaHCO3  Local Anesthesia: 3 cc Lidocaine HCL and NaHCO3 (ratio 2:1)  Pulsed mode: 15 watts, 577ms delay, 1.0 duration Total energy: 1931, total pulses: 129, total time: 2:09     Patient tolerated procedure well: Yes  Notes:   Description of Procedure:  After marking the course of the saphenous vein and the secondary varicosities in the standing position, the patient was placed on the operating table in the supine position, and the left leg was prepped and draped in sterile fashion. Local anesthetic was administered, and under ultrasound guidance the saphenous vein was accessed with a micro needle and guide wire; then the micro puncture sheath was placed. A guide wire was inserted to the saphenofemoral junction, followed by a 5 french sheath.  The position of the sheath and then the laser fiber below the junction was confirmed using the ultrasound and visualization of the aiming beam.  Tumescent anesthesia was administered along the course of the saphenous vein using ultrasound guidance. Protective laser glasses were placed on the patient, and the laser was fired at 15 watt pulsed mode advancing 1-2 mm per sec.  For a total of 1931 joules.  A steri strip was applied to the puncture site.    ABD pads and thigh high compression stockings were applied.  Ace wrap bandages were applied over the phlebectomy sites and at the top of the saphenofemoral junction.  Blood loss was less than 15 cc.  The patient ambulated out of the operating room having tolerated the procedure well.

## 2013-04-20 ENCOUNTER — Telehealth: Payer: Self-pay | Admitting: *Deleted

## 2013-04-20 ENCOUNTER — Encounter: Payer: Self-pay | Admitting: Vascular Surgery

## 2013-04-20 NOTE — Telephone Encounter (Signed)
Patient doing well. Minimal discomfort. Following all instructions except taking his Indomethecin instead of the 3 Ibuprofens TID. Is taking one Advil here and there. Reminded him of his fu appts next week.

## 2013-04-23 ENCOUNTER — Encounter: Payer: Self-pay | Admitting: Vascular Surgery

## 2013-04-26 ENCOUNTER — Ambulatory Visit (INDEPENDENT_AMBULATORY_CARE_PROVIDER_SITE_OTHER): Payer: Managed Care, Other (non HMO) | Admitting: Vascular Surgery

## 2013-04-26 ENCOUNTER — Ambulatory Visit (HOSPITAL_COMMUNITY)
Admission: RE | Admit: 2013-04-26 | Discharge: 2013-04-26 | Disposition: A | Discharge status: Home / Self Care | Payer: Managed Care, Other (non HMO) | Source: Ambulatory Visit | Attending: Vascular Surgery | Admitting: Vascular Surgery

## 2013-04-26 ENCOUNTER — Encounter: Payer: Self-pay | Admitting: Vascular Surgery

## 2013-04-26 VITALS — BP 180/94 | HR 121 | Resp 16 | Ht 68.0 in | Wt 186.4 lb

## 2013-04-26 DIAGNOSIS — I83893 Varicose veins of bilateral lower extremities with other complications: Secondary | ICD-10-CM | POA: Insufficient documentation

## 2013-04-26 NOTE — Progress Notes (Signed)
Subjective:     Patient ID: Martin Dan., male   DOB: May 14, 1940, 73 y.o.   MRN: [id number]  HPI this 73 year old male returns 1 week post laser ablation left great saphenous vein for painful varicosities. He had some mild discomfort along the course of the great saphenous vein but this responded to indomethacin and one dose of ibuprofen. He has had decreased edema in the left ankle and no other specific complaints he has worn elastic compression stocking and elevated his legs as instructed. He continues to have discomfort in the contralateral right leg.  Past Medical History  Diagnosis Date  . CAD (coronary artery disease)   . Hyperlipidemia   . Chronic atrial fibrillation     Dr. Angelena Form  . Anemia   . Hypertension   . Hemorrhoids   . Duodenal ulcer   . History of GI bleed   . Heart murmur   . History of blood transfusion   . Myocardial infarction     history of. 1983  . Heart attack   . Renal insufficiency   . Gout   . Difficult intubation 2005    2014 surgeries no intubation problems with  . AAA (abdominal aortic aneurysm) 02-25-2011    US abdomen 3.4 cm x 3 cm last US done per pt    History  Substance Use Topics  . Smoking status: Former Smoker -- 3.00 packs/day for 25 years    Types: Cigarettes    Quit date: 02/11/1981  . Smokeless tobacco: Never Used  . Alcohol Use: 12.6 oz/week    21 Shots of liquor per week     Comment: daily 2-3 per day    Family History  Problem Relation Age of Onset  . Heart attack Father   . Heart disease Father   . Colon cancer Neg Hx     Allergies  Allergen Reactions  . Eggs Or Egg-Derived Products Nausea And Vomiting    Current outpatient prescriptions:allopurinol (ZYLOPRIM) 100 MG tablet, Take 100 mg by mouth every morning. , Disp: , Rfl: ;  amLODipine (NORVASC) 5 MG tablet, Take 5 mg by mouth every morning. , Disp: , Rfl: ;  chlorhexidine (PERIDEX) 0.12 % solution, , Disp: , Rfl: ;  colchicine 0.6 MG tablet, Take 0.6 mg by  mouth daily as needed (For gout.). , Disp: , Rfl:  ferrous sulfate 325 (65 FE) MG tablet, Take 325 mg by mouth every morning. , Disp: , Rfl: ;  metoprolol succinate (TOPROL-XL) 100 MG 24 hr tablet, Take 100 mg by mouth daily after breakfast. , Disp: , Rfl: ;  neomycin (MYCIFRADIN) 500 MG tablet, , Disp: , Rfl: ;  omeprazole (PRILOSEC) 40 MG capsule, Take 40 mg by mouth every evening. , Disp: , Rfl:  oxyCODONE-acetaminophen (PERCOCET/ROXICET) 5-325 MG per tablet, Take 1-2 tablets by mouth every 4 (four) hours as needed., Disp: 30 tablet, Rfl: 0;  PRADAXA 150 MG CAPS capsule, TAKE 1 CAPSULE (150 MG TOTAL) BY MOUTH EVERY 12 (TWELVE) HOURS., Disp: 60 capsule, Rfl: 0;  rosuvastatin (CRESTOR) 20 MG tablet, Take 20 mg by mouth 2 (two) times a week. He takes on Monday and Friday., Disp: , Rfl:   BP 180/94  Pulse 121  Resp 16  Ht 5\' 8"  (1.727 m)  Wt 186 lb 6.4 oz (84.55 kg)  BMI 28.35 kg/m2  Body mass index is 28.35 kg/(m^2).          Review of Systems denies chest pain, dyspnea on exertion, PND, orthopnea, hemoptysis.  Objective:   Physical Exam BP 180/94  Pulse 121  Resp 16  Ht 5\' 8"  (1.727 m)  Wt 186 lb 6.4 oz (84.55 kg)  BMI 28.35 kg/m2  General well-developed well-nourished male no apparent distress alert and oriented x3 Lungs no rhonchi or wheezing Left leg with no inflammation surrounding saphenous vein in the thigh and mild tenderness to palpation. One plus distal edema noted. Varicosities are less tense than previously.  Backordered a venous duplex exam left leg which are reviewed and interpreted. There is no DVT. There is complete closure of the left great saphenous vein from below the knee to near the saphenofemoral junction.     Assessment:     Successful laser ablation left great saphenous vein for painful varicosities due to reflux will proceed in 2 weeks with a similar procedure in the contralateral right leg and then patient will return in 3 months to see if stab  phlebectomy of residual varicosities will be indicated    Plan:

## 2013-04-27 ENCOUNTER — Other Ambulatory Visit: Payer: Self-pay | Admitting: *Deleted

## 2013-04-27 DIAGNOSIS — I83893 Varicose veins of bilateral lower extremities with other complications: Secondary | ICD-10-CM

## 2013-05-04 DIAGNOSIS — D1801 Hemangioma of skin and subcutaneous tissue: Secondary | ICD-10-CM | POA: Diagnosis not present

## 2013-05-04 DIAGNOSIS — L259 Unspecified contact dermatitis, unspecified cause: Secondary | ICD-10-CM | POA: Diagnosis not present

## 2013-05-04 DIAGNOSIS — L820 Inflamed seborrheic keratosis: Secondary | ICD-10-CM | POA: Diagnosis not present

## 2013-05-04 DIAGNOSIS — L819 Disorder of pigmentation, unspecified: Secondary | ICD-10-CM | POA: Diagnosis not present

## 2013-05-04 DIAGNOSIS — D239 Other benign neoplasm of skin, unspecified: Secondary | ICD-10-CM | POA: Diagnosis not present

## 2013-05-07 ENCOUNTER — Encounter: Payer: Self-pay | Admitting: Vascular Surgery

## 2013-05-07 DIAGNOSIS — E785 Hyperlipidemia, unspecified: Secondary | ICD-10-CM | POA: Diagnosis not present

## 2013-05-07 DIAGNOSIS — K5732 Diverticulitis of large intestine without perforation or abscess without bleeding: Secondary | ICD-10-CM | POA: Diagnosis not present

## 2013-05-07 DIAGNOSIS — R413 Other amnesia: Secondary | ICD-10-CM | POA: Diagnosis not present

## 2013-05-07 DIAGNOSIS — I10 Essential (primary) hypertension: Secondary | ICD-10-CM | POA: Diagnosis not present

## 2013-05-07 DIAGNOSIS — M109 Gout, unspecified: Secondary | ICD-10-CM | POA: Diagnosis not present

## 2013-05-07 DIAGNOSIS — I4891 Unspecified atrial fibrillation: Secondary | ICD-10-CM | POA: Diagnosis not present

## 2013-05-07 DIAGNOSIS — E46 Unspecified protein-calorie malnutrition: Secondary | ICD-10-CM | POA: Diagnosis not present

## 2013-05-07 DIAGNOSIS — D509 Iron deficiency anemia, unspecified: Secondary | ICD-10-CM | POA: Diagnosis not present

## 2013-05-10 ENCOUNTER — Encounter: Payer: Self-pay | Admitting: Vascular Surgery

## 2013-05-10 ENCOUNTER — Ambulatory Visit (INDEPENDENT_AMBULATORY_CARE_PROVIDER_SITE_OTHER): Payer: Managed Care, Other (non HMO) | Admitting: Vascular Surgery

## 2013-05-10 VITALS — BP 153/81 | HR 67 | Resp 16 | Ht 69.0 in | Wt 187.0 lb

## 2013-05-10 DIAGNOSIS — I83893 Varicose veins of bilateral lower extremities with other complications: Secondary | ICD-10-CM | POA: Diagnosis not present

## 2013-05-10 NOTE — Progress Notes (Signed)
Subjective:     Patient ID: Martin Dan., male   DOB: 05/13/1940, 73 y.o.   MRN: [id number]  HPI this 73 year old male had laser ablation of the right great saphenous vein from the proximal calf to near the saphenofemoral junction performed under local tumescent anesthesia. He tolerated the procedure well. Total of 2200 J of energy was utilized.   Review of Systems     Objective:   Physical Exam BP 153/81  Pulse 67  Resp 16  Ht 5\' 9"  (1.753 m)  Wt 187 lb (84.823 kg)  BMI 27.60 kg/m2 \      Assessment:     Well-tolerated laser ablation right great saphenous vein performed under local tumescent anesthesia for venous hypertension with pain and swelling    Plan:     Return in one week for venous duplex exam right leg to confirm closure right great saphenous vein. Patient and then return in 3 months to see if stab phlebectomy of secondary varicosities will be indicated

## 2013-05-10 NOTE — Progress Notes (Signed)
   Laser Ablation Procedure      Date: 05/10/2013    [name]. DOB:1940-04-01  Consent signed: Yes  Surgeon:J.D. Kellie Simmering  Procedure: Laser Ablation: right Greater Saphenous Vein  BP 153/81  Pulse 67  Resp 16  Ht 5\' 9"  (1.753 m)  Wt 187 lb (84.823 kg)  BMI 27.60 kg/m2  Start time: 1:10   End time: 1:50  Tumescent Anesthesia: 250 cc 0.9% NaCl with 50 cc Lidocaine HCL with 1% Epi and 15 cc 8.4% NaHCO3  Local Anesthesia: 5 cc Lidocaine HCL and NaHCO3 (ratio 2:1)  Pulsed mode: 15 watts, 500 ms delay, 1.0 duration Total energy: 2320, total pulses: 155, total time: 2:35     Patient tolerated procedure well: Yes  Notes:   Description of Procedure:  After marking the course of the saphenous vein and the secondary varicosities in the standing position, the patient was placed on the operating table in the supine position, and the right leg was prepped and draped in sterile fashion. Local anesthetic was administered, and under ultrasound guidance the saphenous vein was accessed with a micro needle and guide wire; then the micro puncture sheath was placed. A guide wire was inserted to the saphenofemoral junction, followed by a 5 french sheath.  The position of the sheath and then the laser fiber below the junction was confirmed using the ultrasound and visualization of the aiming beam.  Tumescent anesthesia was administered along the course of the saphenous vein using ultrasound guidance. Protective laser glasses were placed on the patient, and the laser was fired at 15 watt pulsed mode advancing 1-2 mm per sec.  For a total of 2320 joules.  A steri strip was applied to the puncture site.   ABD pads and thigh high compression stockings were applied.  Ace wrap bandages were applied over the phlebectomy sites and at the top of the saphenofemoral junction.  Blood loss was less than 15 cc.  The patient ambulated out of the operating room having tolerated the procedure well.

## 2013-05-11 ENCOUNTER — Telehealth: Payer: Self-pay | Admitting: *Deleted

## 2013-05-11 ENCOUNTER — Encounter: Payer: Self-pay | Admitting: Vascular Surgery

## 2013-05-11 NOTE — Telephone Encounter (Signed)
[place] with patient's wife. He went to work. Not having any problems.

## 2013-05-17 ENCOUNTER — Encounter: Payer: Self-pay | Admitting: Vascular Surgery

## 2013-05-18 ENCOUNTER — Ambulatory Visit (INDEPENDENT_AMBULATORY_CARE_PROVIDER_SITE_OTHER): Payer: Managed Care, Other (non HMO) | Admitting: Vascular Surgery

## 2013-05-18 ENCOUNTER — Encounter: Payer: Self-pay | Admitting: Vascular Surgery

## 2013-05-18 ENCOUNTER — Ambulatory Visit (HOSPITAL_COMMUNITY)
Admission: RE | Admit: 2013-05-18 | Discharge: 2013-05-18 | Disposition: A | Discharge status: Home / Self Care | Payer: Managed Care, Other (non HMO) | Source: Ambulatory Visit | Attending: Vascular Surgery | Admitting: Vascular Surgery

## 2013-05-18 VITALS — BP 156/63 | HR 60 | Resp 18 | Ht 69.0 in | Wt 189.0 lb

## 2013-05-18 DIAGNOSIS — I83893 Varicose veins of bilateral lower extremities with other complications: Secondary | ICD-10-CM

## 2013-05-18 NOTE — Progress Notes (Signed)
Here today for followup of staged bilateral great saphenous vein laser ablation. Most recently left great saphenous vein one week ago by Dr. Kellie Simmering. He has little more discomfort on this side he reports that on the left side. This is been moderate. He is walking without difficulty. He does have the usual amount of bruising at the tumescent injection sites.  Venous duplex was reviewed with the patient. This does show ablation of the saphenous vein from the entry site to just below the saphenofemoral junction. There is no evidence of DVT  Impression and plan successful staged laser ablation of great saphenous vein. She will follow up with Dr. Kellie Simmering in 3 months to determine if stab phlebectomy as required for additional treatment

## 2013-05-20 DIAGNOSIS — M109 Gout, unspecified: Secondary | ICD-10-CM | POA: Diagnosis not present

## 2013-05-26 DIAGNOSIS — R3129 Other microscopic hematuria: Secondary | ICD-10-CM | POA: Diagnosis not present

## 2013-05-26 DIAGNOSIS — L989 Disorder of the skin and subcutaneous tissue, unspecified: Secondary | ICD-10-CM | POA: Diagnosis not present

## 2013-06-15 ENCOUNTER — Encounter: Payer: Self-pay | Admitting: Cardiovascular Disease

## 2013-06-16 ENCOUNTER — Ambulatory Visit (INDEPENDENT_AMBULATORY_CARE_PROVIDER_SITE_OTHER): Payer: Managed Care, Other (non HMO) | Admitting: Cardiovascular Disease

## 2013-06-16 ENCOUNTER — Encounter: Payer: Self-pay | Admitting: Cardiovascular Disease

## 2013-06-16 VITALS — BP 126/74 | HR 70 | Ht 69.0 in | Wt 192.0 lb

## 2013-06-16 DIAGNOSIS — I10 Essential (primary) hypertension: Secondary | ICD-10-CM

## 2013-06-16 DIAGNOSIS — I2510 Atherosclerotic heart disease of native coronary artery without angina pectoris: Secondary | ICD-10-CM

## 2013-06-16 DIAGNOSIS — I4891 Unspecified atrial fibrillation: Secondary | ICD-10-CM

## 2013-06-16 DIAGNOSIS — I779 Disorder of arteries and arterioles, unspecified: Secondary | ICD-10-CM

## 2013-06-16 DIAGNOSIS — I739 Peripheral vascular disease, unspecified: Secondary | ICD-10-CM

## 2013-06-16 NOTE — Patient Instructions (Signed)
Your physician wants you to follow-up in:  12 months.  You will receive a reminder letter in the mail two months in advance. If you don't receive a letter, please call our office to schedule the follow-up appointment.   

## 2013-06-16 NOTE — Progress Notes (Signed)
History of Present Illness: 73 yo male with a past medical history significant for atrial fibrillation, hypertension, hyperlipidemia, coronary artery disease status post MI, and balloon angioplasty in 1980s, as well as peripheral vascular disease with bilateral carotid endarterectomies in 2005, who is here today for cardiac followup. He had been on coumadin in the past but he stopped taking because of a rash and did not wish to restart. He has refused cardioversion or ablation in the past. He has never been aware of his irregular heart rhythm. He has also refused to escalate his medical therapy with antiarrythmic drugs. I started Pradaxa September 2012. Carotid artery disease followed by Dr. Kellie Simmering in [facility]. He was admitted to the GI service on 04/05/2011 with an acute upper GI bleed. He underwent upper endoscopy with Dr. Benson Norway the following morning and was found to have multiple shallow clean-based duodenal ulcers felt to be NSAID-induced. He did not require any endoscopic therapy. His hemoglobin did drift to the 7 range and he was transfused 2 units of packed RBCs. Pradaxa was held. He began to note weight loss in October 2013 and was found to have a diverticular abscess requiring colectomy 04/07/12. 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 Dr. Ellene Route performed laminotomy with removal of cyst on 05/23/12. He has been maintained on Pradaxa without further bleeding issues.   He is here today for follow up. No dizziness, chest pain or SOB. He feels well. He continues to refuse screening stress tests for known CAD or screening carotid dopplers for known carotid artery disease. No further GI bleeding.   Primary Care Physician: Dr. Dagmar Hait  Last Lipid Profile:Lipid Panel     Component Value Date/Time   CHOL 134 04/16/2011 0923   TRIG 83.0 04/16/2011 0923   HDL 53.00 04/16/2011 0923   CHOLHDL 3 04/16/2011 0923   VLDL 16.6 04/16/2011 0923   LDLCALC 64 04/16/2011 0923     Past Medical History  Diagnosis Date  . CAD (coronary artery disease)   . Hyperlipidemia   . Chronic atrial fibrillation     Dr. Angelena Form  . Anemia   . Hypertension   . Hemorrhoids   . Duodenal ulcer   . History of GI bleed   . Heart murmur   . History of blood transfusion   . Myocardial infarction     history of. 1983  . Heart attack   . Renal insufficiency   . Gout   . Difficult intubation 2005    2014 surgeries no intubation problems with  . AAA (abdominal aortic aneurysm) 02-25-2011    US abdomen 3.4 cm x 3 cm last US done per pt    Past Surgical History  Procedure Laterality Date  . Cath/angioplasy      [id number], [place] by Dr. Gilda Crease [facility] of angioplasty)  . Carotid endarterectomy      bilaterally 2007  . No colonoscopy to date      ("I don't have a good excuse"). SOC reviewed  . Cataract extraction Bilateral 2-3 yrs ago  . Pyloric stenosis    . Esophagogastroduodenoscopy  04/06/2011    Procedure: ESOPHAGOGASTRODUODENOSCOPY (EGD);  Surgeon: Beryle Beams, MD;  Location: [facility];  Service: Endoscopy;  Laterality: N/A;  . Eye surgery  2011    bilateral  . 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 Partial colectomy N/A 04/07/2012    Procedure: PARTIAL COLECTOMY;  Surgeon: Edward Jolly, MD;  Location: [facility];  Service: General;  Laterality: N/A;  Sigmoid Colectomy Colostomy hartman procedure  . Laminectomy N/A 05/23/2012    Procedure: LUMBAR four-five LAMINECTOMY ;  Surgeon: Kristeen Miss, MD;  Location: [facility];  Service: Neurosurgery;  Laterality: N/A;  . Tonsillectomy  as child  . Tooth extraction  09/24/16    and dental implant  . Colostomy takedown N/A 09/30/2012    Procedure: TAKEDOWN OF HARTMAN COLOSTOMY;  Surgeon: Edward Jolly, MD;  Location: [facility];  Service: General;   Laterality: N/A;    Current Outpatient Prescriptions  Medication Sig Dispense Refill  . allopurinol (ZYLOPRIM) 100 MG tablet Take 100 mg by mouth every morning.       Marland Kitchen amLODipine (NORVASC) 5 MG tablet Take 5 mg by mouth every morning.       . colchicine 0.6 MG tablet Take 0.6 mg by mouth daily as needed (For gout.).       Marland Kitchen ferrous sulfate 325 (65 FE) MG tablet Take 325 mg by mouth every morning.       . metoprolol succinate (TOPROL-XL) 100 MG 24 hr tablet Take 100 mg by mouth daily after breakfast.       . neomycin (MYCIFRADIN) 500 MG tablet       . omeprazole (PRILOSEC) 40 MG capsule Take 40 mg by mouth every evening.       Marland Kitchen PRADAXA 150 MG CAPS capsule TAKE 1 CAPSULE (150 MG TOTAL) BY MOUTH EVERY 12 (TWELVE) HOURS.  60 capsule  0  . rosuvastatin (CRESTOR) 20 MG tablet Take 20 mg by mouth 2 (two) times a week. He takes on Monday and Friday.       No current facility-administered medications for this visit.    Allergies  Allergen Reactions  . Eggs Or Egg-Derived Products Nausea And Vomiting    History   Social History  . Marital Status: Married    Spouse Name: N/A    Number of Children: 3  . Years of Education: N/A   Occupational History  . Stockbroker    Social History Main Topics  . Smoking status: Former Smoker -- 3.00 packs/day for 25 years    Types: Cigarettes    Quit date: 02/11/1981  . Smokeless tobacco: Never Used  . Alcohol Use: 12.6 oz/week    21 Shots of liquor per week     Comment: daily 2-3 per day  . Drug Use: No  . Sexual Activity: Not Currently   Other Topics Concern  . Not on file   Social History Narrative  . No narrative on file    Family History  Problem Relation Age of Onset  . Heart attack Father   . Heart disease Father   . Colon cancer Neg Hx     Review of Systems:  As stated in the HPI and otherwise negative.   BP 126/74  Pulse 70  Ht 5\' 9"  (1.753 m)  Wt 192 lb (87.091 kg)  BMI 28.34 kg/m2  Physical Examination: General:  Well developed, well nourished, NAD HEENT: OP clear, mucus membranes moist SKIN: warm, dry. No rashes. Neuro: No focal deficits Musculoskeletal: Muscle strength 5/5 all ext Psychiatric: Mood and affect normal Neck: No JVD, no carotid bruits, no thyromegaly, no lymphadenopathy. Lungs:Clear bilaterally, no wheezes, rhonci, crackles Cardiovascular: Irregular, irregular. No murmurs, gallops or rubs. Abdomen:Soft.  Bowel sounds present. Non-tender.  Extremities: No lower extremity edema. Pulses are 2 + in the bilateral DP/PT.  EKG: Atrial fibrillation, rate 70 bpm.   Assessment and Plan:   1. CORONARY ARTERY DISEASE: Stable. No changes. Continue beta blocker, statin. He is not on ASA since he is on Pradaxa and had prior GI bleeding due to ulcers. He refuses surveillance stress testing.   2. ATRIAL FIBRILLATION: He has chronic, persistent atrial fibrillation. Will continue Toprol for rate control and Pradaxa for anti-coagulation.    3. Carotid artery disease:  Bilateral carotid endarterectomy in 2007. He has seen Dr. Kellie Simmering for his LE venous disease and the patient has refused carotid screening.   4. HTN: BP is controlled. No changes.

## 2013-07-08 ENCOUNTER — Other Ambulatory Visit: Payer: Self-pay | Admitting: Cardiovascular Disease

## 2013-08-16 ENCOUNTER — Encounter: Payer: Self-pay | Admitting: Vascular Surgery

## 2013-08-17 ENCOUNTER — Ambulatory Visit (INDEPENDENT_AMBULATORY_CARE_PROVIDER_SITE_OTHER): Payer: Managed Care, Other (non HMO) | Admitting: Vascular Surgery

## 2013-08-17 ENCOUNTER — Encounter: Payer: Self-pay | Admitting: Vascular Surgery

## 2013-08-17 VITALS — BP 153/72 | HR 80 | Resp 16 | Ht 68.0 in | Wt 190.0 lb

## 2013-08-17 DIAGNOSIS — I6529 Occlusion and stenosis of unspecified carotid artery: Secondary | ICD-10-CM | POA: Diagnosis not present

## 2013-08-17 DIAGNOSIS — I83893 Varicose veins of bilateral lower extremities with other complications: Secondary | ICD-10-CM

## 2013-08-17 NOTE — Progress Notes (Signed)
Subjective:     Patient ID: [name], male   DOB: 20-Jul-1940, 73 y.o.   MRN: [id number]  HPI this 73 year old male returns after having staged bilateral ablation procedure of the great saphenous veins for painful varicosities 3 months ago. He states that he is having no discomfort in the legs and has had a significant improvement in the distal edema. He does have one thrombosed varix in the left distal thigh area which is not painful. He states that the varicosities are much less noticeable. He also has a history of bilateral carotid endarterectomies in 2007. He denies any active neurologic symptoms including lateralizing weakness, aphasia, amaurosis fugax, diplopia, blurred vision, and syncope. He takes Nutritional therapist for atrial fibrillation. He is followed by Dr. Linus Mako  Past Medical History  Diagnosis Date  . CAD (coronary artery disease)   . Hyperlipidemia   . Chronic atrial fibrillation     Dr. Angelena Form  . Anemia   . Hypertension   . Hemorrhoids   . Duodenal ulcer   . History of GI bleed   . Heart murmur   . History of blood transfusion   . Myocardial infarction     history of. 1983  . Heart attack   . Renal insufficiency   . Gout   . Difficult intubation 2005    2014 surgeries no intubation problems with  . AAA (abdominal aortic aneurysm) 02-25-2011    US abdomen 3.4 cm x 3 cm last US done per pt    History  Substance Use Topics  . Smoking status: Former Smoker -- 3.00 packs/day for 25 years    Types: Cigarettes    Quit date: 02/11/1981  . Smokeless tobacco: Never Used  . Alcohol Use: 12.6 oz/week    21 Shots of liquor per week     Comment: daily 2-3 per day    Family History  Problem Relation Age of Onset  . Heart attack Father   . Heart disease Father   . Colon cancer Neg Hx     Allergies  Allergen Reactions  . Eggs Or Egg-Derived Products Nausea And Vomiting    Current outpatient prescriptions:allopurinol (ZYLOPRIM) 100 MG tablet, Take 100 mg by  mouth every morning. , Disp: , Rfl: ;  amLODipine (NORVASC) 5 MG tablet, Take 5 mg by mouth every morning. , Disp: , Rfl: ;  colchicine 0.6 MG tablet, Take 0.6 mg by mouth daily as needed (For gout.). , Disp: , Rfl: ;  ferrous sulfate 325 (65 FE) MG tablet, Take 325 mg by mouth every morning. , Disp: , Rfl:  metoprolol succinate (TOPROL-XL) 100 MG 24 hr tablet, Take 100 mg by mouth daily after breakfast. , Disp: , Rfl: ;  neomycin (MYCIFRADIN) 500 MG tablet, , Disp: , Rfl: ;  omeprazole (PRILOSEC) 40 MG capsule, Take 40 mg by mouth every evening. , Disp: , Rfl: ;  PRADAXA 150 MG CAPS capsule, TAKE 1 CAPSULE (150 MG TOTAL) BY MOUTH EVERY 12 (TWELVE) HOURS. **PATIENT NEEDS APPOINTMENT**, Disp: 60 capsule, Rfl: 5 rosuvastatin (CRESTOR) 20 MG tablet, Take 20 mg by mouth 2 (two) times a week. He takes on Monday and Friday., Disp: , Rfl:   BP 153/72  Pulse 80  Resp 16  Ht 5\' 8"  (1.727 m)  Wt 190 lb (86.183 kg)  BMI 28.90 kg/m2  Body mass index is 28.9 kg/(m^2).           Review of Systems see history of present illness-denies chest pain, dyspnea on  exertion, PND, orthopnea.     Objective:   Physical Exam BP 153/72  Pulse 80  Resp 16  Ht 5\' 8"  (1.727 m)  Wt 190 lb (86.183 kg)  BMI 28.90 kg/m2  General well-developed large male no apparent stress alert and oriented x3 HEENT-3+ carotid pulses with soft bruit on the right no bruit on the left. Neurologic exam normal Lower extremities with smaller bulging varicosities in the medial calf area but no distal edema. 3+ dorsalis pedis pulse palpable bilaterally.        Assessment:     Significant improvement in size and symptomatology of painful varicosities following staged bilateral great saphenous vein ablation procedures 8 years post bilateral carotid endarterectomy has not had followup carotid duplex exams    Plan:     Return in 6 months with carotid duplex exam for continued followup No plans for any further treatment of  residual varicosities

## 2013-08-20 ENCOUNTER — Other Ambulatory Visit: Payer: Self-pay

## 2013-08-20 DIAGNOSIS — I6529 Occlusion and stenosis of unspecified carotid artery: Secondary | ICD-10-CM

## 2013-08-20 DIAGNOSIS — Z48812 Encounter for surgical aftercare following surgery on the circulatory system: Secondary | ICD-10-CM

## 2013-09-24 HISTORY — PX: OTHER SURGICAL HISTORY: SHX169

## 2013-09-29 DIAGNOSIS — Z961 Presence of intraocular lens: Secondary | ICD-10-CM | POA: Diagnosis not present

## 2013-09-29 DIAGNOSIS — H353190 Nonexudative age-related macular degeneration, unspecified eye, stage unspecified: Secondary | ICD-10-CM | POA: Diagnosis not present

## 2014-02-08 ENCOUNTER — Encounter: Payer: Self-pay | Admitting: Vascular Surgery

## 2014-02-15 ENCOUNTER — Ambulatory Visit (HOSPITAL_COMMUNITY)
Admission: RE | Admit: 2014-02-15 | Discharge: 2014-02-15 | Disposition: A | Discharge status: Home / Self Care | Payer: Managed Care, Other (non HMO) | Source: Ambulatory Visit | Attending: Family | Admitting: Family

## 2014-02-15 ENCOUNTER — Encounter: Payer: Self-pay | Admitting: Family

## 2014-02-15 ENCOUNTER — Ambulatory Visit (INDEPENDENT_AMBULATORY_CARE_PROVIDER_SITE_OTHER): Payer: Managed Care, Other (non HMO) | Admitting: Family

## 2014-02-15 ENCOUNTER — Other Ambulatory Visit: Payer: Self-pay | Admitting: Vascular Surgery

## 2014-02-15 VITALS — BP 163/81 | HR 65 | Resp 16 | Ht 68.0 in | Wt 207.0 lb

## 2014-02-15 DIAGNOSIS — I6523 Occlusion and stenosis of bilateral carotid arteries: Secondary | ICD-10-CM

## 2014-02-15 DIAGNOSIS — R0989 Other specified symptoms and signs involving the circulatory and respiratory systems: Secondary | ICD-10-CM

## 2014-02-15 DIAGNOSIS — Z9889 Other specified postprocedural states: Secondary | ICD-10-CM

## 2014-02-15 DIAGNOSIS — Z48812 Encounter for surgical aftercare following surgery on the circulatory system: Secondary | ICD-10-CM | POA: Insufficient documentation

## 2014-02-15 DIAGNOSIS — I714 Abdominal aortic aneurysm, without rupture, unspecified: Secondary | ICD-10-CM

## 2014-02-15 NOTE — Progress Notes (Signed)
Established Carotid Patient   History of Present Illness  [name] is a 74 y.o. male patient of Dr. Kellie Simmering who is s/p staged bilateral carotid endarterectomies in 2007. He returns today for follow up surveillance post CEA. The patient denies any history of TIA or stroke symptoms, specifically the patient denies a history of amaurosis fugax or monocular blindness, denies a history unilateral  of facial drooping, denies a history of hemiplegia, and denies a history of receptive or expressive aphasia.  He denies tingling, numbness, weakness, or pain in either arm or hand, denies dizziness.   He denies any claudication symptoms with walking, denies non healing wounds. He takes Pradaxa for atrial fibrillation. He is followed by Dr. Linus Mako.  He is also s/p staged bilateral ablation procedure of the great saphenous veins for painful varicosities in April 2015. He states that he is having no discomfort in the legs and has had a significant improvement in the distal edema. He does have one thrombosed varix in the left distal thigh area which is not painful. He states that the varicosities are much less noticeable.  The patient reports New Medical or Surgical History: he has macular degeneration as told to him by his ophthalmologist.  Pt Diabetic: No Pt smoker: former smoker, quit in 1983 when he had an MI  Pt meds include: Statin : Yes ASA: No Other anticoagulants/antiplatelets: Pradaxa for atrial fib   Past Medical History  Diagnosis Date  . CAD (coronary artery disease)   . Hyperlipidemia   . Chronic atrial fibrillation     Dr. Angelena Form  . Anemia   . Hypertension   . Hemorrhoids   . Duodenal ulcer   . History of GI bleed   . Heart murmur   . History of blood transfusion   . Myocardial infarction     history of. 1983  . Heart attack   . Renal insufficiency   . Gout   . Difficult intubation 2005    2014 surgeries no intubation problems with  . AAA (abdominal aortic  aneurysm) 02-25-2011    US abdomen 3.4 cm x 3 cm last US done per pt    Social History History  Substance Use Topics  . Smoking status: Former Smoker -- 3.00 packs/day for 25 years    Types: Cigarettes    Quit date: 02/11/1981  . Smokeless tobacco: Never Used  . Alcohol Use: 12.6 oz/week    21 Shots of liquor per week     Comment: daily 2-3 per day    Family History Family History  Problem Relation Age of Onset  . Heart attack Father   . Heart disease Father     After age 43  . Cancer Father   . Colon cancer Neg Hx     Surgical History Past Surgical History  Procedure Laterality Date  . Cath/angioplasy      [id number], [facility] by Dr. Gilda Crease [facility] of angioplasty)  . Carotid endarterectomy      bilaterally 2007  . No colonoscopy to date      ("I don't have a good excuse"). SOC reviewed  . Cataract extraction Bilateral 2-3 yrs ago  . Pyloric stenosis    . Esophagogastroduodenoscopy  04/06/2011    Procedure: ESOPHAGOGASTRODUODENOSCOPY (EGD);  Surgeon: Beryle Beams, MD;  Location: [facility];  Service: Endoscopy;  Laterality: N/A;  . Eye surgery  2011    bilateral  . Colostomy N/A 04/07/2012    Procedure: COLOSTOMY;  Surgeon: Edward Jolly,  MD;  Location: [facility];  Service: General;  Laterality: N/A;  Sigmoid Colectomy Colostomy   . Partial colectomy N/A 04/07/2012    Procedure: PARTIAL COLECTOMY;  Surgeon: Edward Jolly, MD;  Location: [facility];  Service: General;  Laterality: N/A;  Sigmoid Colectomy Colostomy hartman procedure  . Laminectomy N/A 05/23/2012    Procedure: LUMBAR four-five LAMINECTOMY ;  Surgeon: Kristeen Miss, MD;  Location: [facility];  Service: Neurosurgery;  Laterality: N/A;  . Tonsillectomy  as child  . Tooth extraction  09/24/16    and dental implant  . Colostomy takedown N/A 09/30/2012    Procedure: TAKEDOWN OF HARTMAN COLOSTOMY;  Surgeon: Edward Jolly, MD;  [facility]: [facility];  Service: General;  Laterality: N/A;    Allergies   Allergen Reactions  . Eggs Or Egg-Derived Products Nausea And Vomiting    Current Outpatient Prescriptions  Medication Sig Dispense Refill  . allopurinol (ZYLOPRIM) 100 MG tablet Take 100 mg by mouth every morning.     Marland Kitchen amLODipine (NORVASC) 5 MG tablet Take 5 mg by mouth every morning.     . colchicine 0.6 MG tablet Take 0.6 mg by mouth daily as needed (For gout.).     Marland Kitchen ferrous sulfate 325 (65 FE) MG tablet Take 325 mg by mouth every morning.     . Multiple Vitamins-Minerals (PRESERVISION AREDS 2 PO) Take by mouth 2 (two) times daily.    Marland Kitchen omeprazole (PRILOSEC) 40 MG capsule Take 40 mg by mouth every evening.     Marland Kitchen PRADAXA 150 MG CAPS capsule TAKE 1 CAPSULE (150 MG TOTAL) BY MOUTH EVERY 12 (TWELVE) HOURS. **PATIENT NEEDS APPOINTMENT** 60 capsule 5  . rosuvastatin (CRESTOR) 20 MG tablet Take 20 mg by mouth 2 (two) times a week. He takes on Monday and Friday.    . metoprolol succinate (TOPROL-XL) 100 MG 24 hr tablet Take 100 mg by mouth daily after breakfast.     . neomycin (MYCIFRADIN) 500 MG tablet      No current facility-administered medications for this visit.    Review of Systems : See HPI for pertinent positives and negatives.  Physical Examination  Filed Vitals:   02/15/14 1555 02/15/14 1600  BP: 139/81 163/81  Pulse: 61 65  Resp:  16  Height:  5\' 8"  (1.727 m)  Weight:  207 lb (93.895 kg)  SpO2:  98%   Body mass index is 31.48 kg/(m^2).  General: WDWN male in NAD GAIT: normal Eyes: PERRLA Pulmonary:  Non-labored, CTAB, Negative  Rales, Negative rhonchi, & Negative wheezing.  Cardiac: irregular Rhythm, no detected murmur.  VASCULAR EXAM Carotid Bruits Right Left   Negative Negative    Aorta is not palpable. Radial pulses: 1+ palpable right, 2+ palpable left                                                                                                                          LE Pulses Right Left  POPLITEAL  not palpable   not palpable       POSTERIOR  TIBIAL  not palpable   not palpable        DORSALIS PEDIS      ANTERIOR TIBIAL  palpable   palpable     Gastrointestinal: soft, nontender, BS WNL, no r/g, no palpable masses.  Musculoskeletal: Negative muscle atrophy/wasting. M/S 5/5 throughout, Extremities without ischemic changes.  Neurologic: A&O X 3; Appropriate Affect, Speech is normal CN 2-12 intact, Pain and light touch intact in extremities, motor exam as listed above.   Non-Invasive Vascular Imaging CAROTID DUPLEX 02/15/2014   Right ICA: patent CEA site. Left ICA: patent CEA site. Right blood pressure gradient present with abnormal to-fro flow involving the right vertebral and monophasic right subclavian artery suggestive of subclavian steal syndrome.  No previous study on file to compare.   Assessment: [name] is a 74 y.o. male who presents with asymptomatic patent bilateral CEA sites. Right blood pressure gradient present with abnormal to-fro flow involving the right vertebral and monophasic right subclavian artery suggestive of subclavian steal syndrome. However, he has no subclavian steal symptoms. No previous study on file to compare.  Noted in his medical history that he has a history of a AAA; on review of records the last imaging of this was 03/04/12 by Fidel[facility] Salisbury requested by a Dr.  Adonis Housekeeper, indication was evaluation of diverticular abcess, no AAA Duplex on file. The result at that time was 3.6 cm x 3.2 cm infrarenal mild aneurysm, stable compared to 2013.  Plan: Follow-up in 1 year with Carotid Duplex and AAA Duplex.   I discussed in depth with the patient the nature of atherosclerosis, and emphasized the importance of maximal medical management including strict control of blood pressure, blood glucose, and lipid levels, obtaining regular exercise, and continued cessation of smoking.  The patient is aware that without maximal medical management the underlying atherosclerotic disease process will  progress, limiting the benefit of any interventions. The patient was given information about stroke prevention and what symptoms should prompt the patient to seek immediate medical care. Thank you for allowing [place] to participate in this patient's care.  Clemon Chambers, RN, MSN, FNP-C [facility] Office: [phone]  Clinic Physician: Kellie Simmering  02/15/2014 4:08 PM

## 2014-02-15 NOTE — Patient Instructions (Signed)
Stroke Prevention Some medical conditions and behaviors are associated with an increased chance of having a stroke. You may prevent a stroke by making healthy choices and managing medical conditions. HOW CAN I REDUCE MY RISK OF HAVING A STROKE?   Stay physically active. Get at least 30 minutes of activity on most or all days.  Do not smoke. It may also be helpful to avoid exposure to secondhand smoke.  Limit alcohol use. Moderate alcohol use is considered to be:  No more than 2 drinks per day for men.  No more than 1 drink per day for nonpregnant women.  Eat healthy foods. This involves:  Eating 5 or more servings of fruits and vegetables a day.  Making dietary changes that address high blood pressure (hypertension), high cholesterol, diabetes, or obesity.  Manage your cholesterol levels.  Making food choices that are high in fiber and low in saturated fat, trans fat, and cholesterol may control cholesterol levels.  Take any prescribed medicines to control cholesterol as directed by your health care provider.  Manage your diabetes.  Controlling your carbohydrate and sugar intake is recommended to manage diabetes.  Take any prescribed medicines to control diabetes as directed by your health care provider.  Control your hypertension.  Making food choices that are low in salt (sodium), saturated fat, trans fat, and cholesterol is recommended to manage hypertension.  Take any prescribed medicines to control hypertension as directed by your health care provider.  Maintain a healthy weight.  Reducing calorie intake and making food choices that are low in sodium, saturated fat, trans fat, and cholesterol are recommended to manage weight.  Stop drug abuse.  Avoid taking birth control pills.  Talk to your health care provider about the risks of taking birth control pills if you are over 35 years old, smoke, get migraines, or have ever had a blood clot.  Get evaluated for sleep  disorders (sleep apnea).  Talk to your health care provider about getting a sleep evaluation if you snore a lot or have excessive sleepiness.  Take medicines only as directed by your health care provider.  For some people, aspirin or blood thinners (anticoagulants) are helpful in reducing the risk of forming abnormal blood clots that can lead to stroke. If you have the irregular heart rhythm of atrial fibrillation, you should be on a blood thinner unless there is a good reason you cannot take them.  Understand all your medicine instructions.  Make sure that other conditions (such as anemia or atherosclerosis) are addressed. SEEK IMMEDIATE MEDICAL CARE IF:   You have sudden weakness or numbness of the face, arm, or leg, especially on one side of the body.  Your face or eyelid droops to one side.  You have sudden confusion.  You have trouble speaking (aphasia) or understanding.  You have sudden trouble seeing in one or both eyes.  You have sudden trouble walking.  You have dizziness.  You have a loss of balance or coordination.  You have a sudden, severe headache with no known cause.  You have new chest pain or an irregular heartbeat. Any of these symptoms may represent a serious problem that is an emergency. Do not wait to see if the symptoms will go away. Get medical help at once. Call your local emergency services (911 in [place] Do not drive yourself to the hospital. Document Released: 03/07/2004 Document Revised: 06/14/2013 Document Reviewed: 07/31/2012 Exit[employer] Patient Information 2015 [employer]. This information is not intended to replace advice given   to you by your health care provider. Make sure you discuss any questions you have with your health care provider.  

## 2014-02-16 ENCOUNTER — Other Ambulatory Visit: Payer: Self-pay | Admitting: *Deleted

## 2014-02-16 ENCOUNTER — Encounter: Payer: Self-pay | Admitting: *Deleted

## 2014-02-16 DIAGNOSIS — I714 Abdominal aortic aneurysm, without rupture, unspecified: Secondary | ICD-10-CM

## 2014-02-17 ENCOUNTER — Telehealth: Payer: Self-pay | Admitting: Family

## 2014-02-17 NOTE — Addendum Note (Signed)
Addended by: Mena Goes on: 02/17/2014 09:20 AM   Modules accepted: Orders

## 2014-02-17 NOTE — Telephone Encounter (Signed)
-----   Message from Viann Fish, NP sent at 02/15/2014  6:49 PM EST ----- Regarding: add AAA Duplex on pt's return Kay, Please call pt and let him know that a AAA Duplex will also be done when he returns in a year for his carotid Duplex. I found after he left that he has a hx of a small AAA; the last time this was imaged was in January 2014 by [facility] during evaluation of his diverticulitis.  No one has been following this, it has just been incidentally commented on when evaluating his diverticulitis. By the time he returns in a year it will be 3 years since he has had any testing for this. Thank you for letting pt know and for entering the order.  Vinnie Level

## 2014-02-17 NOTE — Telephone Encounter (Addendum)
-----   Message from Viann Fish, NP sent at 02/15/2014  6:49 PM EST ----- Regarding: add AAA Duplex on pt's return Kay, Please call pt and let him know that a AAA Duplex will also be done when he returns in a year for his carotid Duplex. I found after he left that he has a hx of a small AAA; the last time this was imaged was in January 2014 by [facility] during evaluation of his diverticulitis.  No one has been following this, it has just been incidentally commented on when evaluating his diverticulitis. By the time he returns in a year it will be 3 years since he has had any testing for this. Thank you for letting pt know and for entering the order.  Vinnie Level  notified patient of change in his appt.

## 2014-03-10 ENCOUNTER — Other Ambulatory Visit: Payer: Self-pay

## 2014-03-10 MED ORDER — DABIGATRAN ETEXILATE MESYLATE 150 MG PO CAPS: ORAL_CAPSULE | ORAL | Status: DC

## 2014-03-30 DIAGNOSIS — Z961 Presence of intraocular lens: Secondary | ICD-10-CM | POA: Diagnosis not present

## 2014-03-30 DIAGNOSIS — H3531 Nonexudative age-related macular degeneration: Secondary | ICD-10-CM | POA: Diagnosis not present

## 2014-04-26 DIAGNOSIS — I739 Peripheral vascular disease, unspecified: Secondary | ICD-10-CM | POA: Diagnosis not present

## 2014-04-26 DIAGNOSIS — M4800 Spinal stenosis, site unspecified: Secondary | ICD-10-CM | POA: Diagnosis not present

## 2014-04-26 DIAGNOSIS — E785 Hyperlipidemia, unspecified: Secondary | ICD-10-CM | POA: Diagnosis not present

## 2014-04-26 DIAGNOSIS — D696 Thrombocytopenia, unspecified: Secondary | ICD-10-CM | POA: Diagnosis not present

## 2014-04-26 DIAGNOSIS — Z1389 Encounter for screening for other disorder: Secondary | ICD-10-CM | POA: Diagnosis not present

## 2014-04-26 DIAGNOSIS — I480 Paroxysmal atrial fibrillation: Secondary | ICD-10-CM | POA: Diagnosis not present

## 2014-04-26 DIAGNOSIS — K270 Acute peptic ulcer, site unspecified, with hemorrhage: Secondary | ICD-10-CM | POA: Diagnosis not present

## 2014-04-26 DIAGNOSIS — M109 Gout, unspecified: Secondary | ICD-10-CM | POA: Diagnosis not present

## 2014-04-26 DIAGNOSIS — I8390 Asymptomatic varicose veins of unspecified lower extremity: Secondary | ICD-10-CM | POA: Diagnosis not present

## 2014-05-10 DIAGNOSIS — Z86018 Personal history of other benign neoplasm: Secondary | ICD-10-CM | POA: Diagnosis not present

## 2014-05-10 DIAGNOSIS — L820 Inflamed seborrheic keratosis: Secondary | ICD-10-CM | POA: Diagnosis not present

## 2014-05-10 DIAGNOSIS — L309 Dermatitis, unspecified: Secondary | ICD-10-CM | POA: Diagnosis not present

## 2014-05-10 DIAGNOSIS — L821 Other seborrheic keratosis: Secondary | ICD-10-CM | POA: Diagnosis not present

## 2014-06-24 ENCOUNTER — Ambulatory Visit: Payer: Managed Care, Other (non HMO) | Admitting: Cardiovascular Disease

## 2014-07-24 ENCOUNTER — Other Ambulatory Visit: Payer: Self-pay | Admitting: Cardiovascular Disease

## 2014-08-10 ENCOUNTER — Ambulatory Visit (INDEPENDENT_AMBULATORY_CARE_PROVIDER_SITE_OTHER): Payer: Managed Care, Other (non HMO) | Admitting: Cardiovascular Disease

## 2014-08-10 ENCOUNTER — Encounter: Payer: Self-pay | Admitting: Cardiovascular Disease

## 2014-08-10 VITALS — BP 142/72 | HR 70 | Ht 68.0 in | Wt 204.0 lb

## 2014-08-10 DIAGNOSIS — I4819 Other persistent atrial fibrillation: Secondary | ICD-10-CM

## 2014-08-10 DIAGNOSIS — I10 Essential (primary) hypertension: Secondary | ICD-10-CM

## 2014-08-10 DIAGNOSIS — I481 Persistent atrial fibrillation: Secondary | ICD-10-CM | POA: Diagnosis not present

## 2014-08-10 DIAGNOSIS — I2510 Atherosclerotic heart disease of native coronary artery without angina pectoris: Secondary | ICD-10-CM

## 2014-08-10 DIAGNOSIS — I6523 Occlusion and stenosis of bilateral carotid arteries: Secondary | ICD-10-CM | POA: Diagnosis not present

## 2014-08-10 NOTE — Progress Notes (Signed)
Chief Complaint  Patient presents with  . Bleeding/Bruising     History of Present Illness: 74 yo male with a past medical history significant for atrial fibrillation, hypertension, hyperlipidemia, coronary artery disease status post MI, and balloon angioplasty in 1980s, as well as peripheral vascular disease with bilateral carotid endarterectomies in 2005, who is here today for cardiac followup. He had been on coumadin in the past but he stopped taking because of a rash and did not wish to restart. He has refused cardioversion or ablation in the past. He has never been aware of his irregular heart rhythm. He has also refused to escalate his medical therapy with antiarrythmic drugs. I started Pradaxa September 2012. Carotid artery disease followed by Dr. Kellie Simmering in [facility] with stable carotid dopplers January 2016. He was admitted to the GI service on 04/05/2011 with an acute upper GI bleed. He underwent upper endoscopy with Dr. Benson Norway the following morning and was found to have multiple shallow clean-based duodenal ulcers felt to be NSAID-induced. He did not require any endoscopic therapy. His hemoglobin did drift to the 7 range and he was transfused 2 units of packed RBCs. Pradaxa was held. He began to note weight loss in October 2013 and was found to have a diverticular abscess requiring colectomy 04/07/12. 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 Dr. Ellene Route performed laminotomy with removal of cyst on 05/23/12. He has been maintained on Pradaxa without further bleeding issues.   He is here today for follow up. No dizziness, chest pain or SOB. He feels well. He continues to refuse screening stress tests for known CAD. He has been taking Pradaxa in the am and having three liquor drinks at night while holding Pradaxa at night.   Primary Care Physician: Dr. Dagmar Hait  Last Lipid Profile:Lipid Panel     Component Value Date/Time   CHOL 134 04/16/2011 0923   TRIG 83.0 04/16/2011 0923   HDL 53.00 04/16/2011 0923   CHOLHDL 3 04/16/2011 0923   VLDL 16.6 04/16/2011 0923   LDLCALC 64 04/16/2011 0923     Past Medical History  Diagnosis Date  . CAD (coronary artery disease)   . Hyperlipidemia   . Chronic atrial fibrillation     Dr. Angelena Form  . Anemia   . Hypertension   . Hemorrhoids   . Duodenal ulcer   . History of GI bleed   . Heart murmur   . History of blood transfusion   . Myocardial infarction     history of. 1983  . Heart attack   . Renal insufficiency   . Gout   . Difficult intubation 2005    2014 surgeries no intubation problems with  . AAA (abdominal aortic aneurysm) 02-25-2011    US abdomen 3.4 cm x 3 cm last US done per pt    Past Surgical History  Procedure Laterality Date  . Cath/angioplasy      [id number], [facility] by Dr. Gilda Crease [facility] of angioplasty)  . Carotid endarterectomy      bilaterally 2007  . No colonoscopy to date      ("I don't have a good excuse"). SOC reviewed  . Cataract extraction Bilateral 2-3 yrs ago  . Pyloric stenosis    . Esophagogastroduodenoscopy  04/06/2011    Procedure: ESOPHAGOGASTRODUODENOSCOPY (EGD);  Surgeon: Beryle Beams, MD;  Location: [facility];  Service: Endoscopy;  Laterality: N/A;  . Eye surgery  2011  bilateral  . 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 Partial colectomy N/A 04/07/2012    Procedure: PARTIAL COLECTOMY;  Surgeon: Edward Jolly, MD;  Location[facility];  Service: General;  Laterality: N/A;  Sigmoid Colectomy Colostomy hartman procedure  . Laminectomy N/A 05/23/2012    Procedure: LUMBAR four-five LAMINECTOMY ;  Surgeon: Kristeen Miss, MD;  Location: [facility];  Service: Neurosurgery;  Laterality: N/A;  . Tonsillectomy  as child  . Tooth extraction  09/24/16    and dental implant  . Colostomy  takedown N/A 09/30/2012    Procedure: TAKEDOWN OF HARTMAN COLOSTOMY;  Surgeon: Edward Jolly, MD;  [facility];  Service: General;  Laterality: N/A;    Current Outpatient Prescriptions  Medication Sig Dispense Refill  . allopurinol (ZYLOPRIM) 100 MG tablet Take 100 mg by mouth every morning.     Marland Kitchen amLODipine (NORVASC) 5 MG tablet Take 5 mg by mouth every morning.     . colchicine 0.6 MG tablet Take 0.6 mg by mouth daily as needed (For gout.).     Marland Kitchen dabigatran (PRADAXA) 150 MG CAPS capsule TAKE 1 CAPSULE (150 MG TOTAL) BY MOUTH EVERY 12 (TWELVE) HOURS. 60 capsule 5  . ferrous sulfate 325 (65 FE) MG tablet Take 325 mg by mouth every morning.     . metoprolol succinate (TOPROL-XL) 100 MG 24 hr tablet Take 100 mg by mouth daily after breakfast.     . Multiple Vitamins-Minerals (PRESERVISION AREDS 2 PO) Take by mouth 2 (two) times daily.    Marland Kitchen omeprazole (PRILOSEC) 40 MG capsule Take 40 mg by mouth every evening.     . rosuvastatin (CRESTOR) 20 MG tablet Take 20 mg by mouth 2 (two) times a week. He takes on Monday and Friday.     No current facility-administered medications for this visit.    Allergies  Allergen Reactions  . Eggs Or Egg-Derived Products Nausea And Vomiting    History   Social History  . Marital Status: Married    Spouse Name: N/A  . Number of Children: 3  . Years of Education: N/A   Occupational History  . Stockbroker    Social History Main Topics  . Smoking status: Former Smoker -- 3.00 packs/day for 25 years    Types: Cigarettes    Quit date: 02/11/1981  . Smokeless tobacco: Never Used  . Alcohol Use: 12.6 oz/week    21 Shots of liquor per week     Comment: daily 2-3 per day  . Drug Use: No  . Sexual Activity: Not Currently   Other Topics Concern  . Not on file   Social History Narrative    Family History  Problem Relation Age of Onset  . Heart attack Father   . Heart disease Father     After age 58  . Cancer Father   . Colon cancer Neg  Hx     Review of Systems:  As stated in the HPI and otherwise negative.   BP 142/72 mmHg  Pulse 70  Ht 5\' 8"  (1.727 m)  Wt 204 lb (92.534 kg)  BMI 31.03 kg/m2  Physical Examination: General: Well developed, well nourished, NAD HEENT: OP clear, mucus membranes moist SKIN: warm, dry. No rashes. Neuro: No focal deficits Musculoskeletal: Muscle strength 5/5 all ext Psychiatric: Mood and affect normal Neck: No JVD, no carotid  bruits, no thyromegaly, no lymphadenopathy. Lungs:Clear bilaterally, no wheezes, rhonci, crackles Cardiovascular: Irregular, irregular. No murmurs, gallops or rubs. Abdomen:Soft. Bowel sounds present. Non-tender.  Extremities: No lower extremity edema. Pulses are 2 + in the bilateral DP/PT.  EKG:  EKG is ordered today. The ekg ordered today demonstrates Atrial fibrillation, rate 70 bpm  Recent Labs: No results found for requested labs within last 365 days.   Lipid Panel    Component Value Date/Time   CHOL 134 04/16/2011 0923   TRIG 83.0 04/16/2011 0923   HDL 53.00 04/16/2011 0923   CHOLHDL 3 04/16/2011 0923   VLDL 16.6 04/16/2011 0923   LDLCALC 64 04/16/2011 0923     Wt Readings from Last 3 Encounters:  08/10/14 204 lb (92.534 kg)  02/15/14 207 lb (93.895 kg)  08/17/13 190 lb (86.183 kg)     Other studies Reviewed: Additional studies/ records that were reviewed today include: . Review of the above records demonstrates:    Assessment and Plan:   1. CORONARY ARTERY DISEASE: Stable. No changes. Continue beta blocker, statin. He is not on ASA since he is on Pradaxa and had prior GI bleeding due to ulcers. He refuses surveillance stress testing or repeat echo.   2. ATRIAL FIBRILLATION: He has chronic, persistent atrial fibrillation. Will continue Toprol for rate control and Pradaxa for anti-coagulation. I have advised him to take the Pradaxa twice daily.    3. Carotid artery disease:  Bilateral carotid endarterectomy in 2007. Followed by Dr.  Kellie Simmering.    4. HTN: BP is controlled. No changes.  Current medicines are reviewed at length with the patient today.  The patient does not have concerns regarding medicines.  The following changes have been made:  no change  Labs/ tests ordered today include:   Orders Placed This Encounter  Procedures  . EKG 12-Lead    Disposition:   FU with me in 12  months  Signed, Lauree Chandler, MD 08/10/2014 3:07 PM    [facility] [place], [place], [place]  [address] Phone: [phone]; Fax: [phone]

## 2014-08-10 NOTE — Patient Instructions (Signed)
Medication Instructions:  Your physician recommends that you continue on your current medications as directed. Please refer to the Current Medication list given to you today.   Labwork: none  Testing/Procedures: none  Follow-Up: Your physician wants you to follow-up in:  12 months.  You will receive a reminder letter in the mail two months in advance. If you don't receive a letter, please call our office to schedule the follow-up appointment.        

## 2014-09-08 ENCOUNTER — Other Ambulatory Visit: Payer: Self-pay | Admitting: Cardiovascular Disease

## 2014-09-11 ENCOUNTER — Other Ambulatory Visit: Payer: Self-pay | Admitting: Cardiovascular Disease

## 2014-11-06 ENCOUNTER — Other Ambulatory Visit: Payer: Self-pay | Admitting: Cardiovascular Disease

## 2014-12-08 ENCOUNTER — Encounter: Payer: Self-pay | Admitting: Cardiovascular Disease

## 2015-02-16 ENCOUNTER — Telehealth: Payer: Self-pay | Admitting: *Deleted

## 2015-02-16 NOTE — Telephone Encounter (Signed)
Received letter from [place] stating Pradaxa would be non-formulary soon.  Covered options are warfarin, Eliquis or Xarelto.  I reviewed with Dr. Angelena Form and pt can start either Eliquis 5 mg twice daily or Xarelto 20 mg daily.  I checked with Ronnald Collum, PharmD and he will not need lab work at this time.  I placed call to pt and left message to call back

## 2015-02-21 ENCOUNTER — Other Ambulatory Visit (HOSPITAL_COMMUNITY): Payer: Managed Care, Other (non HMO)

## 2015-02-21 ENCOUNTER — Ambulatory Visit: Payer: Managed Care, Other (non HMO) | Admitting: Family

## 2015-02-21 ENCOUNTER — Encounter (HOSPITAL_COMMUNITY): Payer: Managed Care, Other (non HMO)

## 2015-03-01 ENCOUNTER — Telehealth: Payer: Self-pay | Admitting: Cardiovascular Disease

## 2015-03-01 NOTE — Telephone Encounter (Signed)
Spoke with pt. He reports he has changed insurance. He also no longer gets medication from [facility].  He states Pradaxa is covered and he would like to continue.  He will contact us if problems with coverage.

## 2015-03-01 NOTE — Telephone Encounter (Signed)
New message ° ° °Patient returning call back to nurse.  °

## 2015-03-01 NOTE — Telephone Encounter (Signed)
See previous phone note dated 02/16/15 that addresses reason for this call.

## 2015-04-24 ENCOUNTER — Telehealth: Payer: Self-pay | Admitting: Cardiovascular Disease

## 2015-04-24 NOTE — Telephone Encounter (Signed)
Spoke with pt's wife who reports she thinks pt had a TIA. Having aphasia. Knows the word but can't say it. Weak and has no energy. Symptoms come and go.  I recommended to pt's wife pt go to ED due to ongoing symptoms.

## 2015-04-24 NOTE — Telephone Encounter (Signed)
I spoke to Ms. Laviolette. She has asked him to go to the ED but instead he went to work and refused to seek attention. Symptoms for one week. He has trouble finding certain words. He may have had a stroke. Symptoms persist now one week in.  I have advised to go to the ED or call primary care for further advice. She agrees. cdm

## 2015-04-24 NOTE — Telephone Encounter (Signed)
I returned call and spoke with pt's wife. I told her Dr. Angelena Form agreed with recommendation of ED.  Pt's wife reports pt is refusing to go to ED and wife is requesting call from Dr. Angelena Form.  I told wife I would send message to Dr. Angelena Form.  I let her know Dr. Angelena Form is in the hospital today and may not be able to get back with them until later today.

## 2015-04-24 NOTE — Telephone Encounter (Signed)
Agree. cdm 

## 2015-04-24 NOTE — Telephone Encounter (Signed)
New message      Pt talked to the nurse this am.  Nurse suggested pt go to the ER.  Wife says pt will not go to the ER unless Dr Angelena Form says so.  Is there any way Dr Angelena Form can call the pt and talk to him?  Pt is aware the doctor is not in the office.

## 2015-04-24 NOTE — Telephone Encounter (Signed)
Please call asap,she thinks her husband had a stroke last week.

## 2015-04-25 DIAGNOSIS — R4182 Altered mental status, unspecified: Secondary | ICD-10-CM | POA: Diagnosis not present

## 2015-04-25 DIAGNOSIS — Z1389 Encounter for screening for other disorder: Secondary | ICD-10-CM | POA: Diagnosis not present

## 2015-04-25 DIAGNOSIS — I7389 Other specified peripheral vascular diseases: Secondary | ICD-10-CM | POA: Diagnosis not present

## 2015-04-25 DIAGNOSIS — H34219 Partial retinal artery occlusion, unspecified eye: Secondary | ICD-10-CM | POA: Diagnosis not present

## 2015-04-25 DIAGNOSIS — I2510 Atherosclerotic heart disease of native coronary artery without angina pectoris: Secondary | ICD-10-CM | POA: Diagnosis not present

## 2015-04-25 DIAGNOSIS — I10 Essential (primary) hypertension: Secondary | ICD-10-CM | POA: Diagnosis not present

## 2015-04-25 DIAGNOSIS — I480 Paroxysmal atrial fibrillation: Secondary | ICD-10-CM | POA: Diagnosis not present

## 2015-04-25 DIAGNOSIS — Z6828 Body mass index (BMI) 28.0-28.9, adult: Secondary | ICD-10-CM | POA: Diagnosis not present

## 2015-05-01 DIAGNOSIS — M109 Gout, unspecified: Secondary | ICD-10-CM | POA: Diagnosis not present

## 2015-05-01 DIAGNOSIS — Z125 Encounter for screening for malignant neoplasm of prostate: Secondary | ICD-10-CM | POA: Diagnosis not present

## 2015-05-01 DIAGNOSIS — R8299 Other abnormal findings in urine: Secondary | ICD-10-CM | POA: Diagnosis not present

## 2015-05-01 DIAGNOSIS — I10 Essential (primary) hypertension: Secondary | ICD-10-CM | POA: Diagnosis not present

## 2015-05-01 DIAGNOSIS — E784 Other hyperlipidemia: Secondary | ICD-10-CM | POA: Diagnosis not present

## 2015-05-01 DIAGNOSIS — N390 Urinary tract infection, site not specified: Secondary | ICD-10-CM | POA: Diagnosis not present

## 2015-05-01 DIAGNOSIS — R7309 Other abnormal glucose: Secondary | ICD-10-CM | POA: Diagnosis not present

## 2015-05-02 DIAGNOSIS — Z0000 Encounter for general adult medical examination without abnormal findings: Secondary | ICD-10-CM | POA: Diagnosis not present

## 2015-05-02 DIAGNOSIS — R3129 Other microscopic hematuria: Secondary | ICD-10-CM | POA: Diagnosis not present

## 2015-05-02 DIAGNOSIS — R310 Gross hematuria: Secondary | ICD-10-CM | POA: Diagnosis not present

## 2015-05-09 DIAGNOSIS — M4800 Spinal stenosis, site unspecified: Secondary | ICD-10-CM | POA: Diagnosis not present

## 2015-05-09 DIAGNOSIS — I739 Peripheral vascular disease, unspecified: Secondary | ICD-10-CM | POA: Diagnosis not present

## 2015-05-09 DIAGNOSIS — Z0000 Encounter for general adult medical examination without abnormal findings: Secondary | ICD-10-CM | POA: Diagnosis not present

## 2015-05-09 DIAGNOSIS — R4182 Altered mental status, unspecified: Secondary | ICD-10-CM | POA: Diagnosis not present

## 2015-05-09 DIAGNOSIS — Z1389 Encounter for screening for other disorder: Secondary | ICD-10-CM | POA: Diagnosis not present

## 2015-05-09 DIAGNOSIS — I10 Essential (primary) hypertension: Secondary | ICD-10-CM | POA: Diagnosis not present

## 2015-05-09 DIAGNOSIS — I2510 Atherosclerotic heart disease of native coronary artery without angina pectoris: Secondary | ICD-10-CM | POA: Diagnosis not present

## 2015-05-09 DIAGNOSIS — E784 Other hyperlipidemia: Secondary | ICD-10-CM | POA: Diagnosis not present

## 2015-05-09 DIAGNOSIS — I8390 Asymptomatic varicose veins of unspecified lower extremity: Secondary | ICD-10-CM | POA: Diagnosis not present

## 2015-05-09 DIAGNOSIS — I480 Paroxysmal atrial fibrillation: Secondary | ICD-10-CM | POA: Diagnosis not present

## 2015-05-09 DIAGNOSIS — K270 Acute peptic ulcer, site unspecified, with hemorrhage: Secondary | ICD-10-CM | POA: Diagnosis not present

## 2015-05-09 DIAGNOSIS — H34219 Partial retinal artery occlusion, unspecified eye: Secondary | ICD-10-CM | POA: Diagnosis not present

## 2015-05-10 DIAGNOSIS — R310 Gross hematuria: Secondary | ICD-10-CM | POA: Diagnosis not present

## 2015-05-10 DIAGNOSIS — N200 Calculus of kidney: Secondary | ICD-10-CM | POA: Diagnosis not present

## 2015-05-12 ENCOUNTER — Other Ambulatory Visit: Payer: Self-pay | Admitting: Urology

## 2015-05-15 ENCOUNTER — Other Ambulatory Visit: Payer: Self-pay | Admitting: Urology

## 2015-05-15 DIAGNOSIS — R310 Gross hematuria: Secondary | ICD-10-CM | POA: Diagnosis not present

## 2015-05-15 DIAGNOSIS — R3129 Other microscopic hematuria: Secondary | ICD-10-CM | POA: Diagnosis not present

## 2015-05-16 ENCOUNTER — Other Ambulatory Visit: Payer: Self-pay | Admitting: Urology

## 2015-05-17 ENCOUNTER — Encounter (HOSPITAL_COMMUNITY)
Admission: RE | Admit: 2015-05-17 | Discharge: 2015-05-17 | Disposition: A | Discharge status: Home / Self Care | Payer: PPO | Source: Ambulatory Visit | Attending: Urology | Admitting: Urology

## 2015-05-17 ENCOUNTER — Encounter: Payer: Self-pay | Admitting: Nurse Practitioner

## 2015-05-17 ENCOUNTER — Encounter (HOSPITAL_COMMUNITY): Payer: Self-pay

## 2015-05-17 ENCOUNTER — Ambulatory Visit (INDEPENDENT_AMBULATORY_CARE_PROVIDER_SITE_OTHER): Payer: PPO | Admitting: Nurse Practitioner

## 2015-05-17 VITALS — BP 142/78 | HR 68 | Ht 68.0 in | Wt 196.1 lb

## 2015-05-17 DIAGNOSIS — Z9119 Patient's noncompliance with other medical treatment and regimen: Secondary | ICD-10-CM | POA: Diagnosis not present

## 2015-05-17 DIAGNOSIS — C679 Malignant neoplasm of bladder, unspecified: Secondary | ICD-10-CM | POA: Diagnosis not present

## 2015-05-17 DIAGNOSIS — Z01812 Encounter for preprocedural laboratory examination: Secondary | ICD-10-CM | POA: Insufficient documentation

## 2015-05-17 DIAGNOSIS — I2510 Atherosclerotic heart disease of native coronary artery without angina pectoris: Secondary | ICD-10-CM

## 2015-05-17 DIAGNOSIS — I6523 Occlusion and stenosis of bilateral carotid arteries: Secondary | ICD-10-CM

## 2015-05-17 DIAGNOSIS — I10 Essential (primary) hypertension: Secondary | ICD-10-CM | POA: Diagnosis not present

## 2015-05-17 DIAGNOSIS — Z91199 Patient's noncompliance with other medical treatment and regimen due to unspecified reason: Secondary | ICD-10-CM

## 2015-05-17 HISTORY — DX: Malignant (primary) neoplasm, unspecified: C80.1

## 2015-05-17 HISTORY — DX: Cardiac arrhythmia, unspecified: I49.9

## 2015-05-17 HISTORY — DX: Hematuria, unspecified: R31.9

## 2015-05-17 LAB — BASIC METABOLIC PANEL
Anion gap: 8 (ref 5–15)
BUN: 19 mg/dL (ref 6–20)
CO2: 28 mmol/L (ref 22–32)
CREATININE: 1.44 mg/dL — AB (ref 0.61–1.24)
Calcium: 9.4 mg/dL (ref 8.9–10.3)
Chloride: 108 mmol/L (ref 101–111)
GFR calc Af Amer: 53 mL/min — ABNORMAL LOW (ref >60)
GFR calc non Af Amer: 46 mL/min — ABNORMAL LOW (ref >60)
GLUCOSE: 136 mg/dL — AB (ref 65–99)
POTASSIUM: 4.7 mmol/L (ref 3.5–5.1)
Sodium: 144 mmol/L (ref 135–145)

## 2015-05-17 LAB — SURGICAL PCR SCREEN
MRSA, PCR: NEGATIVE
Staphylococcus aureus: NEGATIVE

## 2015-05-17 LAB — CBC
HEMATOCRIT: 40.5 % (ref 39.0–52.0)
Hemoglobin: 13.2 g/dL (ref 13.0–17.0)
MCH: 34.3 pg — ABNORMAL HIGH (ref 26.0–34.0)
MCHC: 32.6 g/dL (ref 30.0–36.0)
MCV: 105.2 fL — AB (ref 78.0–100.0)
PLATELETS: 98 10*3/uL — AB (ref 150–400)
RBC: 3.85 MIL/uL — ABNORMAL LOW (ref 4.22–5.81)
RDW: 15.1 % (ref 11.5–15.5)
WBC: 5.7 10*3/uL (ref 4.0–10.5)

## 2015-05-17 NOTE — Patient Instructions (Signed)
[name]  05/17/2015   Your procedure is scheduled on: 05-26-15  Report to [facility]  Entrance take [facility]  elevators to 3rd floor to  [place] at   [place] AM.  Call this number if you have problems the morning of surgery [phone]   Remember: ONLY 1 PERSON MAY GO WITH YOU TO SHORT STAY TO GET  READY MORNING OF [place].  Do not eat food or drink liquids :After Midnight.     Take these medicines the morning of surgery with A SIP OF WATER: Allopurinol. Amlodipine. Metoprolol. Rosuvastatin. / Pradaxa per  MD instructions. DO NOT TAKE ANY DIABETIC MEDICATIONS DAY OF YOUR SURGERY                               You may not have any metal on your body including hair pins and              piercings  Do not wear jewelry, make-up, lotions, powders or perfumes, deodorant             Do not wear nail polish.  Do not shave  48 hours prior to surgery.              Men may shave face and neck.   Do not bring valuables to the hospital. [place].  Contacts, dentures or bridgework may not be worn into surgery.  Leave suitcase in the car. After surgery it may be brought to your room.     Patients discharged the day of surgery will not be allowed to drive home.  Name and phone number of your driver: Suhail Poppleton [phone] cell  Special Instructions: N/A              Please read over the following fact sheets you were given: _____________________________________________________________________             [facility] - Preparing for Surgery Before surgery, you can play an important role.  Because skin is not sterile, your skin needs to be as free of germs as possible.  You can reduce the number of germs on your skin by washing with CHG (chlorahexidine gluconate) soap before surgery.  CHG is an antiseptic cleaner which kills germs and bonds with the skin to continue killing germs even after  washing. Please DO NOT use if you have an allergy to CHG or antibacterial soaps.  If your skin becomes reddened/irritated stop using the CHG and inform your nurse when you arrive at Short Stay. Do not shave (including legs and underarms) for at least 48 hours prior to the first CHG shower.  You may shave your face/neck. Please follow these instructions carefully:  1.  Shower with CHG Soap the night before surgery and the  morning of Surgery.  2.  If you choose to wash your hair, wash your hair first as usual with your  normal  shampoo.  3.  After you shampoo, rinse your hair and body thoroughly to remove the  shampoo.                           4.  Use CHG as you would any other  liquid soap.  You can apply chg directly  to the skin and wash                       Gently with a scrungie or clean washcloth.  5.  Apply the CHG Soap to your body ONLY FROM THE NECK DOWN.   Do not use on face/ open                           Wound or open sores. Avoid contact with eyes, ears mouth and genitals (private parts).                       Wash face,  Genitals (private parts) with your normal soap.             6.  Wash thoroughly, paying special attention to the area where your surgery  will be performed.  7.  Thoroughly rinse your body with warm water from the neck down.  8.  DO NOT shower/wash with your normal soap after using and rinsing off  the CHG Soap.                9.  Pat yourself dry with a clean towel.            10.  Wear clean pajamas.            11.  Place clean sheets on your bed the night of your first shower and do not  sleep with pets. Day of Surgery : Do not apply any lotions/deodorants the morning of surgery.  Please wear clean clothes to the hospital/surgery center.  FAILURE TO FOLLOW THESE INSTRUCTIONS MAY RESULT IN THE CANCELLATION OF YOUR SURGERY PATIENT SIGNATURE_________________________________  NURSE  SIGNATURE__________________________________  ________________________________________________________________________

## 2015-05-17 NOTE — Pre-Procedure Instructions (Addendum)
EKG 6'16 Epic. CT abd/pelvis 11'13 Epic Pt states seeing Dr. Angelena Form today for clearance. 05-17-15 0935 Dr. Lysle Pearl - aware of hx difficult intubation with no issues since 2014 procedures- will see AM of. Last CT abdomen 11'13- Epic(hx. Infra renal aortic aneurysm) - Dr. Angelena Form follows. 05-17-15 1400 Epic note of visit with cardiology today.

## 2015-05-17 NOTE — Progress Notes (Signed)
CARDIOLOGY OFFICE NOTE  Date:  05/17/2015    [name] Date of Birth: 1940/02/24 Medical Record [id number]  PCP:  Tivis Ringer, MD  Cardiologist:  [facility]    Chief Complaint  Patient presents with  . Pre-op Exam    Seen for Dr. Angelena Form    History of Present Illness: [name] is a 75 y.o. male who presents today for a pre op clearance visit. Seen for Dr. Angelena Form.   He has a past medical history significant for chronic atrial fibrillation, hypertension, hyperlipidemia, coronary artery disease status post MI, with balloon angioplasty in 1980s, as well as peripheral vascular disease with bilateral carotid endarterectomies in 2005. He had been on coumadin in the past but he stopped taking because of a rash and did not wish to restart. He has refused cardioversion or ablation in the past. He has never been aware of his irregular heart rhythm. He has also refused to escalate his medical therapy with antiarrythmic drugs. Pradaxa was started in September 2012. Carotid artery disease followed by Dr. Kellie Simmering in [facility] with stable carotid dopplers January 2016. He was admitted to the GI service on 04/05/2011 with an acute upper GI bleed. He underwent upper endoscopy with Dr. Benson Norway and was found to have multiple shallow clean-based duodenal ulcers felt to be NSAID-induced. He did not require any endoscopic therapy. His hemoglobin did drift to the 7 range and he was transfused 2 units of packed RBCs. Pradaxa was held. He began to note weight loss in October 2013 and was found to have a diverticular abscess requiring colectomy 04/07/12. 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 Dr. Ellene Route performed laminotomy with removal of cyst on 05/23/12. He has been maintained on Pradaxa without further bleeding issues.   He was last seen here in June of 2016 and denied symptoms. He refused screening tests for his CAD. He  was only taking his Pradaxa in the am and having three liquor drinks at night while holding Pradaxa at night despite prior recommendation.  Phone call last month - had had some difficulty finding his words - was advised to go to the ER - he did not comply.   Comes back today. Here alone. Needing pre op clearance for a cysto, TURBT, right JJ stent and TURP with GU - scheduled for next week.  No chest pain. Not short of breath. No syncope. 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 Does note more fatigue over the past 2 weeks but attributes this to his recent diagnosis of bladder cancer. He is anxious to proceed. He has no cardiac concerns. He is taking his Pradaxa twice a day. He says he has not had any more spells of where his words were mixed up.   Past Medical History  Diagnosis Date  . CAD (coronary artery disease)   . Hyperlipidemia   . Chronic atrial fibrillation (HCC)     Dr. Angelena Form  . Anemia   . Hypertension   . Hemorrhoids   . Duodenal ulcer   . History of GI bleed   . Heart murmur   . History of blood transfusion     last -5 yrs ago "ulcer'  . Myocardial infarction [facility])     history of. 1983  . Heart attack ([place])   . Renal insufficiency   . Gout   . Difficult intubation 2005  2014 surgeries no intubation problems with  . AAA (abdominal aortic aneurysm) ([place]) 02-25-2011    US abdomen 3.4 cm x 3 cm last US done per pt  . Cancer [facility])     Bladder cancer  . Dysrhythmia     Atrial fibrillation-Dr. Angelena Form follows  . Hematuria     "bladder cancer"    Past Surgical History  Procedure Laterality Date  . Cath/angioplasy      [id number], [facility] by Dr. Gilda Crease [facility] of angioplasty)  . Carotid endarterectomy      bilaterally 2007  . No colonoscopy to date      ("I don't have a good excuse"). SOC reviewed  . Cataract extraction Bilateral 2-3 yrs ago  . Pyloric stenosis    . Esophagogastroduodenoscopy  04/06/2011    Procedure:  ESOPHAGOGASTRODUODENOSCOPY (EGD);  Surgeon: Beryle Beams, MD;  Location: [facility];  Service: Endoscopy;  Laterality: N/A;  . Eye surgery  2011    bilateral  . 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 Partial colectomy N/A 04/07/2012    Procedure: PARTIAL COLECTOMY;  Surgeon: Edward Jolly, MD;  Location: [facility] ORS;  Service: General;  Laterality: N/A;  Sigmoid Colectomy Colostomy hartman procedure  . Laminectomy N/A 05/23/2012    Procedure: LUMBAR four-five LAMINECTOMY ;  Surgeon: Kristeen Miss, MD;  Location: [place] [facility]URO ORS;  Service: Neurosurgery;  Laterality: N/A;  . Tonsillectomy  as child  . Tooth extraction  09/24/16    and dental implant  . Colostomy takedown N/A 09/30/2012    Procedure: TAKEDOWN OF HARTMAN COLOSTOMY;  Surgeon: Edward Jolly, MD;  Location: [facility] ORS;  Service: General;  Laterality: N/A;  . Cardiac catheterization      '83  . Cystoscopy      done in Office -Dr. Gaynelle Arabian 05-15-15     Medications: Current Outpatient Prescriptions  Medication Sig Dispense Refill  . amLODipine (NORVASC) 5 MG tablet Take 5 mg by mouth every morning.     . dabigatran (PRADAXA) 150 MG CAPS capsule Take 1 capsule (150 mg total) by mouth every 12 (twelve) hours. 60 capsule 5  . metoprolol succinate (TOPROL-XL) 100 MG 24 hr tablet Take 100 mg by mouth daily after breakfast.     . omeprazole (PRILOSEC) 40 MG capsule Take 40 mg by mouth every evening.     . rosuvastatin (CRESTOR) 20 MG tablet Take 20 mg by mouth 2 (two) times a week. He takes on Monday and Friday.     No current facility-administered medications for this visit.    Allergies: Allergies  Allergen Reactions  . Eggs Or Egg-Derived Products Nausea And Vomiting    Social History: The patient  reports that he quit smoking about 34 years ago. His smoking use included Cigarettes. He has a  75 pack-year smoking history. He has never used smokeless tobacco. He reports that he drinks about 12.6 oz of alcohol per week. He reports that he does not use illicit drugs.   Family History: The patient's family history includes Cancer in his father; Heart attack in his father; Heart disease in his father. There is no history of Colon cancer.   Review of Systems: Please see the history of present illness.   Otherwise, the review of systems is positive for none.   All other systems are reviewed and negative.   Physical Exam: VS:  BP 142/78 mmHg  Pulse 68  Ht 5\' 8"  (1.727 m)  Wt 196 lb 1.9 oz (88.959 kg)  BMI 29.83 kg/m2 .  BMI Body mass index is 29.83 kg/(m^2).  Wt Readings from Last 3 Encounters:  05/17/15 196 lb 1.9 oz (88.959 kg)  05/17/15 196 lb (88.905 kg)  08/10/14 204 lb (92.534 kg)    General: Pleasant. Well developed, well nourished and in no acute distress.  HEENT: Normal. Neck: Supple, no JVD, carotid bruits, or masses noted.  Cardiac: Irregular irregular rhythm. Rate is ok. No murmurs, rubs, or gallops. No edema.  Respiratory:  Lungs are clear to auscultation bilaterally with normal work of breathing.  GI: Soft and nontender.  MS: No deformity or atrophy. Gait and ROM intact. Skin: Warm and dry. Color is normal.  Neuro:  Strength and sensation are intact and no gross focal deficits noted.  Psych: Alert, appropriate and with normal affect.   LABORATORY DATA:  EKG:  EKG is ordered today. This demonstrates chronic atrial fib - rate is 68.  Lab Results  Component Value Date   WBC 5.7 05/17/2015   HGB 13.2 05/17/2015   HCT 40.5 05/17/2015   PLT 98* 05/17/2015   GLUCOSE 136* 05/17/2015   CHOL 134 04/16/2011   TRIG 83.0 04/16/2011   HDL 53.00 04/16/2011   LDLCALC 64 04/16/2011   ALT 10 05/23/2012   AST 23 05/23/2012   NA 144 05/17/2015   K 4.7 05/17/2015   CL 108 05/17/2015   CREATININE 1.44* 05/17/2015   BUN 19 05/17/2015   CO2 28 05/17/2015   TSH 0.51  04/16/2011   PSA 1.24 08/23/2010   INR 1.04 05/23/2012   HGBA1C 5.1 04/16/2011   MICROALBUR 0.9 09/04/2006    BNP (last 3 results) No results for input(s): BNP in the last 8760 hours.  ProBNP (last 3 results) No results for input(s): PROBNP in the last 8760 hours.   Other Studies Reviewed Today:  ECHO SUMMARY 2009 - Overall left ventricular systolic function was normal. Left    ventricular ejection fraction was estimated to be 60 %. - The aortic valve was mildly calcified. There was trivial aortic    valvular regurgitation. - There was mild mitral valvular regurgitation. The effective    orifice of mitral regurgitation by proximal isovelocity    surface area was 0.04 cm^2. The volume of mitral    regurgitation by proximal isovelocity surface area was 5 cc. - The left atrium was mildly dilated. - The right ventricle was mildly dilated. - The right atrium was mildly dilated.   Assessment/Plan:  1. CORONARY ARTERY DISEASE: Stable. No changes. Continue beta blocker, statin. He is not on ASA since he is on Pradaxa and had prior GI bleeding due to ulcers. He has continued to refuse surveillance stress testing or repeat echo and fortunately at this time, he is not have symptoms.   2. ATRIAL FIBRILLATION: He has chronic, persistent atrial fibrillation. Will continue Toprol for rate control and Pradaxa for anti-coagulation. Would only hold the Pradaxa for 3 days prior to his surgery. This has been discussed with Dr. Angelena Form today who is in agreement.   3. Carotid artery disease: Bilateral carotid endarterectomy in 2007. Followed by Dr. Kellie Simmering.   4. HTN: BP is controlled. No changes.  5. Thrombocytopenia noted on recent lab. Would defer follow up to PCP.   6. Pre op clearance - he is felt to be at increased risk for procedures given the nature of his medical issues, ?  Recent possible TIA/stroke - but now with bladder cancer and needs to proceed.  Would hold the Pradaxa for just 3 days. He is cleared from a cardiac standpoint. Will be available as needed.   Current medicines are reviewed with the patient today.  The patient does not have concerns regarding medicines other than what has been noted above.  The following changes have been made:  See above.  Labs/ tests ordered today include:   No orders of the defined types were placed in this encounter.     Disposition:   FU with Dr. Angelena Form as planned in June.    Patient is agreeable to this plan and will call if any problems develop in the interim.   Signed: Burtis Junes, RN, ANP-C 05/17/2015 11:49 AM  [facility] [address] [place], [place]  [address] Phone: [phone] Fax: [phone]

## 2015-05-17 NOTE — Patient Instructions (Addendum)
We will be checking the following labs today - NONE   Medication Instructions:    Continue with your current medicines.   Dr. Angelena Form and I only want you to hold your Pradaxa 3 days prior to your surgery.     Testing/Procedures To Be Arranged:  N/A  Follow-Up:   See Dr. Angelena Form in June (recall visit)    Other Special Instructions:   I will send a note to Dr. Gaynelle Arabian    If you need a refill on your cardiac medications before your next appointment, please call your pharmacy.   Call the [place] office at [phone] if you have any questions, problems or concerns.

## 2015-05-18 ENCOUNTER — Other Ambulatory Visit: Payer: Self-pay | Admitting: Urology

## 2015-05-18 MED ORDER — DOUBLE ANTIBIOTIC 500-10000 UNIT/GM EX OINT: TOPICAL_OINTMENT | Freq: Two times a day (BID) | CUTANEOUS | Status: AC

## 2015-05-25 NOTE — Anesthesia Preprocedure Evaluation (Addendum)
Anesthesia Evaluation  Patient identified by MRN, date of birth, ID band Patient awake    Reviewed: Allergy & Precautions, H&P , NPO status , Patient's Chart, lab work & pertinent test results  History of Anesthesia Complications (+) DIFFICULT AIRWAY and history of anesthetic complications  Airway Mallampati: II  TM Distance: >3 FB Neck ROM: Full    Dental no notable dental hx. (+) Dental Advisory Given   Pulmonary neg pulmonary ROS, former smoker,    Pulmonary exam normal breath sounds clear to auscultation       Cardiovascular hypertension, Pt. on medications and Pt. on home beta blockers (-) angina+ CAD and + Past MI  (-) Peripheral Vascular Disease Normal cardiovascular exam+ dysrhythmias Atrial Fibrillation + Valvular Problems/Murmurs  Rhythm:Regular Rate:Normal     Neuro/Psych negative neurological ROS  negative psych ROS   GI/Hepatic Neg liver ROS, PUD, GERD  Medicated,  Endo/Other  negative endocrine ROS  Renal/GU Renal InsufficiencyRenal disease     Musculoskeletal negative musculoskeletal ROS (+)   Abdominal   Peds  Hematology negative hematology ROS (+)   Anesthesia Other Findings   Reproductive/Obstetrics                           Anesthesia Physical  Anesthesia Plan  ASA: III  Anesthesia Plan: General   Post-op Pain Management:    Induction: Intravenous  Airway Management Planned: LMA, Video Laryngoscope Planned and Oral ETT  Additional Equipment:   Intra-op Plan:   Post-operative Plan: Extubation in OR  Informed Consent: I have reviewed the patients History and Physical, chart, labs and discussed the procedure including the risks, benefits and alternatives for the proposed anesthesia with the patient or authorized representative who has indicated his/her understanding and acceptance.   Dental Advisory Given and Dental advisory given  Plan Discussed with: CRNA  and Surgeon  Anesthesia Plan Comments: (Discussed GA with LMA, possible sore throat, potential need to switch to ETT using glidescope as before. Discussed N/V, pulmonary aspiration. Questions answered. )        Anesthesia Quick Evaluation

## 2015-05-26 ENCOUNTER — Observation Stay (HOSPITAL_COMMUNITY)
Admission: RE | Admit: 2015-05-26 | Discharge: 2015-05-27 | Disposition: A | Discharge status: Home / Self Care | Payer: PPO | Source: Ambulatory Visit | Attending: Urology | Admitting: Urology

## 2015-05-26 ENCOUNTER — Ambulatory Visit (HOSPITAL_COMMUNITY): Payer: PPO | Admitting: Anesthesiology

## 2015-05-26 ENCOUNTER — Encounter (HOSPITAL_COMMUNITY): Payer: Self-pay | Admitting: *Deleted

## 2015-05-26 ENCOUNTER — Encounter (HOSPITAL_COMMUNITY)
Admission: RE | Disposition: A | Discharge status: Home / Self Care | Payer: Self-pay | Source: Ambulatory Visit | Attending: Urology

## 2015-05-26 DIAGNOSIS — C678 Malignant neoplasm of overlapping sites of bladder: Secondary | ICD-10-CM | POA: Diagnosis not present

## 2015-05-26 DIAGNOSIS — I2510 Atherosclerotic heart disease of native coronary artery without angina pectoris: Secondary | ICD-10-CM | POA: Insufficient documentation

## 2015-05-26 DIAGNOSIS — N1330 Unspecified hydronephrosis: Secondary | ICD-10-CM | POA: Insufficient documentation

## 2015-05-26 DIAGNOSIS — I252 Old myocardial infarction: Secondary | ICD-10-CM | POA: Insufficient documentation

## 2015-05-26 DIAGNOSIS — R8290 Unspecified abnormal findings in urine: Secondary | ICD-10-CM | POA: Diagnosis not present

## 2015-05-26 DIAGNOSIS — Z87891 Personal history of nicotine dependence: Secondary | ICD-10-CM | POA: Insufficient documentation

## 2015-05-26 DIAGNOSIS — Z79899 Other long term (current) drug therapy: Secondary | ICD-10-CM | POA: Insufficient documentation

## 2015-05-26 DIAGNOSIS — N3289 Other specified disorders of bladder: Secondary | ICD-10-CM | POA: Diagnosis not present

## 2015-05-26 DIAGNOSIS — C670 Malignant neoplasm of trigone of bladder: Secondary | ICD-10-CM

## 2015-05-26 DIAGNOSIS — I129 Hypertensive chronic kidney disease with stage 1 through stage 4 chronic kidney disease, or unspecified chronic kidney disease: Secondary | ICD-10-CM | POA: Diagnosis not present

## 2015-05-26 DIAGNOSIS — D494 Neoplasm of unspecified behavior of bladder: Secondary | ICD-10-CM | POA: Diagnosis not present

## 2015-05-26 DIAGNOSIS — Z7901 Long term (current) use of anticoagulants: Secondary | ICD-10-CM | POA: Insufficient documentation

## 2015-05-26 DIAGNOSIS — I4891 Unspecified atrial fibrillation: Secondary | ICD-10-CM | POA: Insufficient documentation

## 2015-05-26 DIAGNOSIS — C679 Malignant neoplasm of bladder, unspecified: Secondary | ICD-10-CM | POA: Diagnosis not present

## 2015-05-26 DIAGNOSIS — Z9861 Coronary angioplasty status: Secondary | ICD-10-CM | POA: Diagnosis not present

## 2015-05-26 DIAGNOSIS — N189 Chronic kidney disease, unspecified: Secondary | ICD-10-CM | POA: Diagnosis not present

## 2015-05-26 DIAGNOSIS — I10 Essential (primary) hypertension: Secondary | ICD-10-CM | POA: Insufficient documentation

## 2015-05-26 HISTORY — PX: TRANSURETHRAL RESECTION OF BLADDER TUMOR: SHX2575

## 2015-05-26 HISTORY — PX: CYSTOSCOPY W/ URETERAL STENT PLACEMENT: SHX1429

## 2015-05-26 LAB — PROTIME-INR
INR: 1.05 (ref 0.00–1.49)
Prothrombin Time: 13.9 seconds (ref 11.6–15.2)

## 2015-05-26 SURGERY — CYSTOSCOPY, WITH RETROGRADE PYELOGRAM AND URETERAL STENT INSERTION
Anesthesia: General | Laterality: Right

## 2015-05-26 MED ORDER — PROPOFOL 10 MG/ML IV BOLUS
INTRAVENOUS | Status: DC | PRN
  Administered 2015-05-26: 40 mg via INTRAVENOUS
  Administered 2015-05-26: 110 mg via INTRAVENOUS

## 2015-05-26 MED ORDER — ONDANSETRON HCL 4 MG/2ML IJ SOLN
INTRAMUSCULAR | Status: AC
  Filled 2015-05-26: qty 2

## 2015-05-26 MED ORDER — KETOROLAC TROMETHAMINE 30 MG/ML IJ SOLN
INTRAMUSCULAR | Status: AC
  Filled 2015-05-26: qty 1

## 2015-05-26 MED ORDER — METOPROLOL SUCCINATE ER 100 MG PO TB24
100.0000 mg | ORAL_TABLET | Freq: Every day | ORAL | Status: DC
  Administered 2015-05-27: 100 mg via ORAL
  Filled 2015-05-26: qty 1

## 2015-05-26 MED ORDER — DEXAMETHASONE SODIUM PHOSPHATE 10 MG/ML IJ SOLN
INTRAMUSCULAR | Status: AC
  Filled 2015-05-26: qty 1

## 2015-05-26 MED ORDER — ACETAMINOPHEN 500 MG PO TABS
1000.0000 mg | ORAL_TABLET | Freq: Four times a day (QID) | ORAL | Status: DC | PRN
  Administered 2015-05-26: 1000 mg via ORAL
  Filled 2015-05-26: qty 2

## 2015-05-26 MED ORDER — SODIUM CHLORIDE 0.9 % IR SOLN
Status: DC | PRN
  Administered 2015-05-26: 12000 mL via INTRAVESICAL

## 2015-05-26 MED ORDER — EPHEDRINE SULFATE 50 MG/ML IJ SOLN
INTRAMUSCULAR | Status: AC
  Filled 2015-05-26: qty 1

## 2015-05-26 MED ORDER — KETOROLAC TROMETHAMINE 30 MG/ML IJ SOLN
INTRAMUSCULAR | Status: DC | PRN
  Administered 2015-05-26: 30 mg via INTRAVENOUS

## 2015-05-26 MED ORDER — LIDOCAINE HCL (CARDIAC) 20 MG/ML IV SOLN
INTRAVENOUS | Status: AC
  Filled 2015-05-26: qty 5

## 2015-05-26 MED ORDER — IOPAMIDOL (ISOVUE-300) INJECTION 61%
INTRAVENOUS | Status: AC
  Filled 2015-05-26: qty 50

## 2015-05-26 MED ORDER — FENTANYL CITRATE (PF) 100 MCG/2ML IJ SOLN
INTRAMUSCULAR | Status: DC | PRN
  Administered 2015-05-26 (×4): 50 ug via INTRAVENOUS

## 2015-05-26 MED ORDER — FENTANYL CITRATE (PF) 100 MCG/2ML IJ SOLN
INTRAMUSCULAR | Status: AC
  Filled 2015-05-26: qty 2

## 2015-05-26 MED ORDER — BELLADONNA ALKALOIDS-OPIUM 16.2-60 MG RE SUPP
RECTAL | Status: DC | PRN
  Administered 2015-05-26: 1 via RECTAL

## 2015-05-26 MED ORDER — DEXAMETHASONE SODIUM PHOSPHATE 10 MG/ML IJ SOLN
INTRAMUSCULAR | Status: DC | PRN
  Administered 2015-05-26: 5 mg via INTRAVENOUS

## 2015-05-26 MED ORDER — LACTATED RINGERS IV SOLN
INTRAVENOUS | Status: DC | PRN
  Administered 2015-05-26 (×2): via INTRAVENOUS

## 2015-05-26 MED ORDER — CIPROFLOXACIN IN D5W 400 MG/200ML IV SOLN
400.0000 mg | INTRAVENOUS | Status: AC
  Administered 2015-05-26: 400 mg via INTRAVENOUS

## 2015-05-26 MED ORDER — SENNA 8.6 MG PO TABS
1.0000 | ORAL_TABLET | Freq: Two times a day (BID) | ORAL | Status: DC
  Administered 2015-05-26 – 2015-05-27 (×3): 8.6 mg via ORAL
  Filled 2015-05-26 (×3): qty 1

## 2015-05-26 MED ORDER — DEXTROSE-NACL 5-0.45 % IV SOLN
INTRAVENOUS | Status: DC
  Administered 2015-05-26 – 2015-05-27 (×3): via INTRAVENOUS

## 2015-05-26 MED ORDER — MIRABEGRON ER 25 MG PO TB24
25.0000 mg | ORAL_TABLET | Freq: Every day | ORAL | Status: DC
  Administered 2015-05-26 – 2015-05-27 (×2): 25 mg via ORAL
  Filled 2015-05-26 (×2): qty 1

## 2015-05-26 MED ORDER — MEPERIDINE HCL 50 MG/ML IJ SOLN
6.2500 mg | INTRAMUSCULAR | Status: DC | PRN
Start: 2015-05-26 — End: 2015-05-26

## 2015-05-26 MED ORDER — ONDANSETRON HCL 4 MG/2ML IJ SOLN: 4.0000 mg | INTRAMUSCULAR | Status: DC | PRN

## 2015-05-26 MED ORDER — TAMSULOSIN HCL 0.4 MG PO CAPS
0.4000 mg | ORAL_CAPSULE | Freq: Every day | ORAL | Status: DC
  Administered 2015-05-26 – 2015-05-27 (×2): 0.4 mg via ORAL
  Filled 2015-05-26 (×2): qty 1

## 2015-05-26 MED ORDER — AMLODIPINE BESYLATE 5 MG PO TABS
5.0000 mg | ORAL_TABLET | Freq: Every morning | ORAL | Status: DC
  Administered 2015-05-27: 5 mg via ORAL
  Filled 2015-05-26: qty 1

## 2015-05-26 MED ORDER — ONDANSETRON HCL 4 MG/2ML IJ SOLN: 4.0000 mg | Freq: Once | INTRAMUSCULAR | Status: DC | PRN

## 2015-05-26 MED ORDER — BELLADONNA ALKALOIDS-OPIUM 16.2-60 MG RE SUPP
RECTAL | Status: AC
  Filled 2015-05-26: qty 1

## 2015-05-26 MED ORDER — ONDANSETRON HCL 4 MG/2ML IJ SOLN
INTRAMUSCULAR | Status: DC | PRN
  Administered 2015-05-26 (×4): 2 mg via INTRAVENOUS

## 2015-05-26 MED ORDER — PHENYLEPHRINE 40 MCG/ML (10ML) SYRINGE FOR IV PUSH (FOR BLOOD PRESSURE SUPPORT)
PREFILLED_SYRINGE | INTRAVENOUS | Status: AC
  Filled 2015-05-26: qty 10

## 2015-05-26 MED ORDER — HYDROCODONE-ACETAMINOPHEN 5-325 MG PO TABS
1.0000 | ORAL_TABLET | ORAL | Status: DC | PRN
  Administered 2015-05-26 (×2): 1 via ORAL
  Filled 2015-05-26 (×2): qty 1

## 2015-05-26 MED ORDER — FINASTERIDE 5 MG PO TABS
5.0000 mg | ORAL_TABLET | Freq: Every day | ORAL | Status: DC
  Administered 2015-05-26 – 2015-05-27 (×2): 5 mg via ORAL
  Filled 2015-05-26 (×2): qty 1

## 2015-05-26 MED ORDER — LIDOCAINE HCL (CARDIAC) 20 MG/ML IV SOLN
INTRAVENOUS | Status: DC | PRN
  Administered 2015-05-26: 80 mg via INTRAVENOUS

## 2015-05-26 MED ORDER — PANTOPRAZOLE SODIUM 40 MG PO TBEC
40.0000 mg | DELAYED_RELEASE_TABLET | Freq: Every day | ORAL | Status: DC
  Administered 2015-05-26 – 2015-05-27 (×2): 40 mg via ORAL
  Filled 2015-05-26 (×2): qty 1

## 2015-05-26 MED ORDER — MORPHINE SULFATE (PF) 2 MG/ML IV SOLN: 2.0000 mg | INTRAVENOUS | Status: DC | PRN

## 2015-05-26 MED ORDER — BELLADONNA ALKALOIDS-OPIUM 16.2-60 MG RE SUPP: 1.0000 | Freq: Four times a day (QID) | RECTAL | Status: DC | PRN

## 2015-05-26 MED ORDER — CIPROFLOXACIN IN D5W 400 MG/200ML IV SOLN
INTRAVENOUS | Status: AC
  Filled 2015-05-26: qty 200

## 2015-05-26 MED ORDER — DOCUSATE SODIUM 100 MG PO CAPS
100.0000 mg | ORAL_CAPSULE | Freq: Two times a day (BID) | ORAL | Status: DC
  Administered 2015-05-26 – 2015-05-27 (×3): 100 mg via ORAL
  Filled 2015-05-26 (×5): qty 1

## 2015-05-26 MED ORDER — PROPOFOL 10 MG/ML IV BOLUS
INTRAVENOUS | Status: AC
  Filled 2015-05-26: qty 20

## 2015-05-26 MED ORDER — EPHEDRINE SULFATE 50 MG/ML IJ SOLN
INTRAMUSCULAR | Status: DC | PRN
  Administered 2015-05-26: 5 mg via INTRAVENOUS
  Administered 2015-05-26 (×2): 10 mg via INTRAVENOUS

## 2015-05-26 MED ORDER — FENTANYL CITRATE (PF) 100 MCG/2ML IJ SOLN: 25.0000 ug | INTRAMUSCULAR | Status: DC | PRN

## 2015-05-26 MED ORDER — FLUORESCEIN SODIUM 10 % IJ SOLN
500.0000 mg | Freq: Once | INTRAMUSCULAR | Status: AC
  Administered 2015-05-26 (×2): 50 mg via INTRAVENOUS
  Filled 2015-05-26: qty 5

## 2015-05-26 SURGICAL SUPPLY — 22 items
BAG URINE DRAINAGE (UROLOGICAL SUPPLIES) ×4 IMPLANT
BAG URO CATCHER STRL LF (MISCELLANEOUS) ×4 IMPLANT
CATH FOLEY 2WAY SLVR 18FR 30CC (CATHETERS) ×4 IMPLANT
CATH HEMA 3WAY 30CC 22FR COUDE (CATHETERS) IMPLANT
CATH INTERMIT  6FR 70CM (CATHETERS) ×4 IMPLANT
CLOTH BEACON ORANGE TIMEOUT ST (SAFETY) ×4 IMPLANT
GLOVE BIOGEL M STRL SZ7.5 (GLOVE) ×4 IMPLANT
GOWN STRL REUS W/TWL LRG LVL3 (GOWN DISPOSABLE) ×4 IMPLANT
GOWN STRL REUS W/TWL XL LVL3 (GOWN DISPOSABLE) ×4 IMPLANT
GUIDEWIRE STR DUAL SENSOR (WIRE) ×4 IMPLANT
HOLDER FOLEY CATH W/STRAP (MISCELLANEOUS) IMPLANT
IV NS IRRIG 3000ML ARTHROMATIC (IV SOLUTION) ×8 IMPLANT
KIT ASPIRATION TUBING (SET/KITS/TRAYS/PACK) ×4 IMPLANT
LOOP CUT BIPOLAR 24F LRG (ELECTROSURGICAL) ×4 IMPLANT
MANIFOLD NEPTUNE II (INSTRUMENTS) ×4 IMPLANT
NS IRRIG 1000ML POUR BTL (IV SOLUTION) ×4 IMPLANT
PACK CYSTO (CUSTOM PROCEDURE TRAY) ×4 IMPLANT
SCRUB PCMX 4 OZ (MISCELLANEOUS) ×4 IMPLANT
SYR 30ML LL (SYRINGE) ×4 IMPLANT
SYRINGE IRR TOOMEY STRL 70CC (SYRINGE) ×4 IMPLANT
TUBING CONNECTING 10 (TUBING) ×6 IMPLANT
TUBING CONNECTING 10' (TUBING) ×2

## 2015-05-26 NOTE — Interval H&P Note (Signed)
History and Physical Interval Note:  05/26/2015 7:09 AM  [name]  has presented today for surgery, with the diagnosis of POSITIVE CYTOLOGY, RIGHT BLADDER MASS, RIGHT HYDRONEPHROSIS  The various methods of treatment have been discussed with the patient and family. After consideration of risks, benefits and other options for treatment, the patient has consented to  Procedure(s): CYSTOSCOPY WITH RETROGRADE PYELOGRAM/URETERAL POSSIBLE JJ STENT PLACEMENT (Right) TRANSURETHRAL RESECTION OF BLADDER TUMOR (TURBT) (N/A) TRANSURETHRAL RESECTION OF THE PROSTATE (TURP) (N/A) as a surgical intervention .  The patient's history has been reviewed, patient examined, no change in status, stable for surgery.  I have reviewed the patient's chart and labs.  Questions were answered to the patient's satisfaction.     [name] I [name]

## 2015-05-26 NOTE — H&P (Signed)
Reason For Visit Cystoscopy, flowrate, PVR & PUS   Active Problems Problems  1. Gross hematuria (R31.0) 2. Lesions Penile Shaft 3. Microscopic hematuria (R31.29)  History of Present Illness    75 yo male returns today for a cystoscopy, flowrate, PVR & prostate u/s. Hx of asymptomatic gross hematuria that was noted on 05/01/15, but resolved next morning. 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 No dysuria, no flank pain, no hx of tobacco use, no fever or chills, no abdominal pain. He denies any problem voiding.       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 . He has normal bowel movements now. No abdominal pain.   Past Medical History Problems  1. History of acute myocardial infarction (I25.2) 2. History of atrial fibrillation (Z86.79) 3. History of cardiac disorder (Z86.79) 4. History of gastric ulcer (Z87.19) 5. History of hypertension (Z86.79)  Surgical History Problems  1. History of Back Surgery 2. History of Carotid Artery Exploration 3. History of Colostomy 4. History of Colostomy Closure 5. History of Gastric Surgery  Current Meds 1. Allopurinol TABS;  Therapy: (Recorded:21Mar2017) to Recorded 2. AmLODIPine Besylate 5 MG Oral Tablet;  Therapy: (Recorded:15Apr2015) to Recorded 3. Crestor 20 MG Oral Tablet;  Therapy: (Recorded:15Apr2015) to Recorded 4. DiazePAM 10 MG Oral Tablet; Take tablet 1 hour prior to procedure;  Therapy: [id number] to (Last Rx:31Mar2017) Ordered 5. Metoprolol Tartrate 100 MG Oral Tablet;  Therapy: (Recorded:15Apr2015) to Recorded 6. Pradaxa 150 MG Oral Capsule;  Therapy: (Recorded:15Apr2015) to Recorded  Allergies Medication  1. No Known Drug Allergies  Family History Problems  1. Family history of malignant neoplasm (Z80.9) :  Father  Social History Problems  1. Alcohol use (Z78.9)   3 per day 2. Caffeine use (F15.90)   2 per day 3. Father deceased 72. Former smoker (Z87.891)   3 ppd x 42 yrs 5. Former smoker [id number] 6. Mother deceased 59. Number of children   3 sons 68. Occupation   Teacher, music  Review of Systems Genitourinary, constitutional, skin, eye, otolaryngeal, hematologic/lymphatic, cardiovascular, pulmonary, endocrine, musculoskeletal, gastrointestinal, neurological and psychiatric system(s) were reviewed and pertinent findings if present are noted and are otherwise negative.  Genitourinary: nocturia, difficulty starting the urinary stream, weak urinary stream, urinary stream starts and stops, incomplete emptying of bladder, hematuria and initiating urination requires straining, but no urinary frequency, no feelings of urinary urgency, no dysuria, no incontinence and no post-void dribbling.  Gastrointestinal: no nausea, no vomiting, no flank pain, no diarrhea and no constipation.  Integumentary: pruritus.  Respiratory: no shortness of breath, no cough and no shortness of breath during exertion.    Vitals Vital Signs [Data Includes: Last 1 Day]  Recorded: 03Apr2017 01:06PM  Blood Pressure: 138 / 76 Temperature: 98.2 F Heart Rate: 71  Physical Exam Constitutional: Well nourished and well developed . No acute distress.  ENT:. The ears and nose are normal in appearance.  Neck: The appearance of the neck is normal and no neck mass is present.  Pulmonary: No respiratory distress.  Cardiovascular: Heart rate and rhythm are normal.  Abdomen: The abdomen is rounded. Bowel sounds are normal.  Rectal: Rectal exam demonstrates normal sphincter tone, no tenderness and no masses. Estimated prostate size is 3+. The prostate has no nodularity, is not indurated and  is not tender. The left seminal vesicle is nonpalpable. The right seminal vesicle is nonpalpable. The perineum is normal on inspection.   Genitourinary: Examination of the penis demonstrates no discharge, no masses, no lesions and a normal meatus. The penis is circumcised. The scrotum is without lesions. The right epididymis is palpably normal and non-tender. The left epididymis is palpably normal and non-tender. The right testis is non-tender and without masses. The left testis is non-tender and without masses.  Skin: Normal skin turgor, no visible rash and no visible skin lesions.  Neuro/Psych:. Mood and affect are appropriate.    Results/Data  Flow Rate: Voided 229 ml. A peak flow rate of 21m/s and mean flow rate of 337ms.  PVR: Ultrasound PVR 67 ml. Selected Results  AU CT-HEMATURIA PROTOCOL [id number] TaCarolan Clines Test Name Result Flag Reference  AU CT-HEMATURIA PROTOCOL (Report)    ** RADIOLOGY REPORT BY [facility] **   CLINICAL DATA: Gross and microhematuria  EXAM: CT ABDOMEN AND PELVIS WITHOUT AND WITH CONTRAST  TECHNIQUE: Multidetector CT imaging of the abdomen and pelvis was performed following the standard protocol before and following the bolus administration of intravenous contrast.  CONTRAST: 125 mL Isovue 300 IV  COMPARISON: 06/01/2013  FINDINGS: Lower chest: Subpleural reticulation/ fibrosis at the lung bases.  Cardiomegaly. Three vessel coronary atherosclerosis.  Hepatobiliary: Liver is within normal limits. No suspicious/enhancing hepatic lesions.  Gallbladder is unremarkable. No intrahepatic or extrahepatic ductal dilatation.  Pancreas: Within normal limits.  Spleen: Within normal limits.  Adrenals/Urinary Tract: Adrenal glands are within normal limits.  2 mm nonobstructing left upper pole renal calculus (series 2/image 28). No ureteral or bladder calculi.  Two left lower pole renal cysts measuring up to 8 mm (series 8/ image 38). No enhancing renal lesions.  Mild right hydroureteronephrosis. Abrupt narrowing/stricture of the distal right ureter  (series 4/image 35).  Bladder is mildly thick-walled although underdistended. Suspected 6 x 13 mm enhancing lesion along the right posterolateral bladder, adjacent to the right ureteral orifice (series 4/ image 81).  On delayed imaging, there are no filling defects in the bilateral proximal collecting systems or ureters. Distal right ureter remains suboptimally opacified. Suspected lesion in the right posterolateral bladder (series 10/image 58).  Stomach/Bowel: Stomach is within normal limits.  No evidence of bowel obstruction.  Status post left hemicolectomy, with suture line in the left pelvis (series 4/image 70).  Vascular/Lymphatic: 3.3 x 3.4 cm infrarenal abdominal aortic aneurysm (series 4/ image 34), unchanged. Atherosclerotic calcifications of the abdominal aorta and branch vessels.  No suspicious abdominopelvic lymphadenopathy.  Reproductive: Prostatomegaly, with enlargement of the central gland which indents the base of the bladder.  Other: No abdominopelvic ascites.  Small fat containing right inguinal hernia.  Musculoskeletal: Degenerative changes of the visualized thoracolumbar spine.  IMPRESSION: Suspected 6 x 13 mm enhancing lesion along the right posterolateral bladder, adjacent to the right ureteral orifice, raising the possibility of primary bladder neoplasm. Cystoscopic correlation is suggested.  Mild right hydroureteronephrosis. Abrupt narrowing/stricture of the distal right ureter. Although urothelial neoplasm is difficult to exclude by imaging, no definite urothelial mass is visualized.  2 mm nonobstructing left upper pole renal calculus. No ureteral or bladder calculi.  Two left lower pole renal cyst measuring up to 8 mm. No enhancing renal lesions.   Electronically Signed  By: SrJulian Hy.D.  On: 05/10/2015 16:32   URINE CYTOLOGY W/REFLEX FISH [id number] TaCarolan ClinesSPECIMEN TYPE: URINE   Test Name Result Flag  Reference  FINAL DIAGNOSIS:  A   - POSITIVE FOR MALIGNANCY. HIGH-GRADE UROTHELIAL CARCINOMA. RBC'S AND WBC'S ARE PRESENT. DUE TO THE DIAGNOSIS RENDERED ON THIS PATIENT WE REQUEST THAT THE CYTOLOGY LABORATORY BE PROVIDED WITH ADDITIONAL FOLLOW UP ON THE PATIENT WHEN AVAILABLE.  NUMBER OF SLIDES     1 Container Submitted  SOURCE:     Urine: Not Otherwise Specified  40CC IN CUP1, 40CC IN CUP2 OF CLEAR YELLOW URI RECEIVED IN FIXATIVE 1 SLIDE PREPARED 700174 EM  Relevant Clinical Info GROSS HEMATURIA    CYTOTECHNOLOGIST:     MJS, BA CT(ASCP)  PATHOLOGIST:     REVIEWED BY VALERIE J. FIELDS, MD, FCAP (ELECTRONIC SIGNATURE ON FILE)   CREATININE with eGFR 21Mar2017 09:33AM [name]  SPECIMEN TYPE: BLOOD   Test Name Result Flag Reference  CREATININE 1.21 mg/dL  0.50-1.50  Est GFR, African American 67 mL/min  >=60  Est GFR, NonAfrican American 58 mL/min L >=60  THE ESTIMATED GFR IS A CALCULATION VALID FOR ADULTS (>=70 YEARS OLD) THAT USES THE CKD-EPI ALGORITHM TO ADJUST FOR AGE AND SEX. IT IS   NOT TO BE USED FOR CHILDREN, PREGNANT WOMEN, HOSPITALIZED PATIENTS,    PATIENTS ON DIALYSIS, OR WITH RAPIDLY CHANGING KIDNEY FUNCTION. ACCORDING TO THE NKDEP, EGFR >89 IS NORMAL, 60-89 SHOWS MILD IMPAIRMENT, 30-59 SHOWS MODERATE IMPAIRMENT, 15-29 SHOWS SEVERE IMPAIRMENT AND <15 IS ESRD.   Procedure  Procedure: Cystoscopy  Chaperone Present: Hillary Bow, CMA.  Indication: Hematuria.  Informed Consent: Risks, benefits, and potential adverse events were discussed and informed consent was obtained from the patient.  Premedication: Diazepam 10 mg.  Prep: The patient was prepped with betadine.  Anesthesia:. Local anesthesia was administered intraurethrally with 2% lidocaine jelly.  Antibiotic prophylaxis: Ciprofloxacin.  Procedure Note:  Prostatic urethra:1 . The lateral prostatic lobes were enlarged1 . No intravesical median lobe was visualized1 .  Bladder: Visulization was1  clear1 .  Examination of the bladder demonstrated1  trabeculation1  , but  no diverticulum1  no cellules1 . multiple tumors were identified in the bladder1  A  papillary1  tumor was seen in the bladder1  measuring approximately 2 cm in size1 . This tumor was located  on the right side1  ,  on the anterior aspect1  ,  near the [place]  of the bladder1 . Another  papillary1  tumor was seen in the bladder1  measuring approximately 2 cm in size1 . This tumor was located  on the right side1  ,  on the anterior aspect1  ,  at the neck1  of the bladder1 . The patient tolerated the procedure well1   Complications:1  None1 .     1 Amended By: [name]; May 18 2015 12:40 PM EST  Assessment Assessed  1. Gross hematuria (R31.0) 2. Microscopic hematuria (R31.29)  Multiple bladder tumors at 12 and 6 o'clock positions. He will need TUR-BT. He will need pre-op clearance from Dr. Blanchard Mane. Will arrange surgery for next Friday.   Plan Gross hematuria  1. PROSTATE U/S; Status:Canceled;   Surgery for next Friday.   Discussion/Summary cc: Berneta Sages. MD   cc: Marius Ditch, MD     Signatures Electronically signed by : [name], M.D.; May 18 2015 12:40PM EST

## 2015-05-26 NOTE — Transfer of Care (Signed)
Immediate Anesthesia Transfer of Care Note  Patient: [name]  Procedure(s) Performed: Procedure(s): CYSTOSCOPY WITH LEFT RETROGRADE PYELOGRAM/URETERAL  (Right) TRANSURETHRAL RESECTION OF BLADDER TUMOR (TURBT) (N/A)  Patient Location: PACU  Anesthesia Type:General  Level of Consciousness:  sedated, patient cooperative and responds to stimulation  Airway & Oxygen Therapy:Patient Spontanous Breathing and Patient connected to face mask oxgen  Post-op Assessment:  Report given to PACU RN and Post -op Vital signs reviewed and stable  Post vital signs:  Reviewed and stable  Last Vitals:  Filed Vitals:   05/26/15 0529  BP: 155/75  Pulse: 78  Temp: 36.3 C  Resp: 16    Complications: No apparent anesthesia complications

## 2015-05-26 NOTE — Anesthesia Postprocedure Evaluation (Signed)
Anesthesia Post Note  Patient: [name]  Procedure(s) Performed: Procedure(s) (LRB): CYSTOSCOPY WITH LEFT RETROGRADE PYELOGRAM/URETERAL  (Right) TRANSURETHRAL RESECTION OF BLADDER TUMOR (TURBT) (N/A)  Patient location during evaluation: PACU Anesthesia Type: General Level of consciousness: sedated Pain management: satisfactory to patient Vital Signs Assessment: post-procedure vital signs reviewed and stable Respiratory status: spontaneous breathing Cardiovascular status: stable Anesthetic complications: no    Last Vitals:  Filed Vitals:   05/26/15 0945 05/26/15 1006  BP: 126/76 133/69  Pulse: 67 66  Temp: 36.5 C 36.6 C  Resp: 13 14    Last Pain:  Filed Vitals:   05/26/15 1056  PainSc: 4                  Uchenna Rappaport EDWARD

## 2015-05-26 NOTE — Op Note (Deleted)
1. Bladder tumor (> 5cm cm)  Postoperative diagnosis:  1. Bladder tumor (>5 cm)  Procedure:  1. Cystoscopy 2. Transurethral resection of bladder tumor (large) 3. Left retrograde pyelography with interpretation  Surgeon: Pryor Curia. M.D.  Anesthesia: General  Complications: None  Intraoperative findings:  1. Bladder tumor: was large and encompassed the entire trigone to the bladder neck. The left UO was only partially involved while the right UO was completely obscured, consistent with clinical T3 disease causing hydronephrosis.  2. Retrograde pyelography: on the left demonstrated a normal ureter and delicate collecting system with no hydro.   EBL: Minimal  Specimens: 1. Bladder tumor  Disposition of specimens: Pathology  Indication: [name] is a patient who was found to have a bladder tumor. After reviewing the management options for treatment, he elected to proceed with the above surgical procedure(s). We have discussed the potential benefits and risks of the procedure, side effects of the proposed treatment, the likelihood of the patient achieving the goals of the procedure, and any potential problems that might occur during the procedure or recuperation. Informed consent has been obtained.  Description of procedure:  The patient was taken to the operating room and general anesthesia was induced.  The patient was placed in the dorsal lithotomy position, prepped and draped in the usual sterile fashion, and preoperative antibiotics were administered. A preoperative time-out was performed.   Cystourethroscopy was performed and showed a high ridding prostate with bladder tumor along the entir trigone, particularly on the right side. We switched to our 26 [place] resectoscope with Beatrix Fetters working element and electrocautery loop with which we performed transurethral resection of the bladder tumor. We carefully resected down to a depth of muscularis propria. The UOs  were likely involved bilaterally and were resected across with cut current only. Fluorescein dye was injected IV and we were able to see eflux from the left UO but never from the right consistent with the obstruction seen on CT. We achieved satisfactory hemostasis along the way using bipolar electrocautery. Once complete, we irrigated and evacuated the patient's bladder several times using To[place]y syringe and bladder filling and evacuationand all resection chips were sent for Pathology labeled bladder tumor. We then fulgurated theperiphery of our resection site circumferentially. We then ensuredsatisfactory hemostasis with the bladder relatively empty. At this point, bladder was left full and all instrumentation was removed. We inserted a 18 French 2 way foley catheter with 10 mL water in the balloon. Catheter was placed to drainage and CBI was attached but left in off position. Draining urine was clear and without clot. The patient was then awoken from anesthesia and taken to recovery area.

## 2015-05-26 NOTE — Anesthesia Procedure Notes (Signed)
Procedure Name: LMA Insertion Date/Time: 05/26/2015 8:08 AM Performed by: Freddie Breech Pre-anesthesia Checklist: Patient identified, Emergency Drugs available, Suction available, Patient being monitored and Timeout performed Patient Re-evaluated:Patient Re-evaluated prior to inductionOxygen Delivery Method: Circle system utilized Preoxygenation: Pre-oxygenation with 100% oxygen Intubation Type: IV induction LMA: LMA inserted LMA Size: 4.0 Number of attempts: 1 Airway Equipment and Method: Patient positioned with wedge pillow Placement Confirmation: positive ETCO2,  CO2 detector and breath sounds checked- equal and bilateral Tube secured with: Tape Dental Injury: Teeth and Oropharynx as per pre-operative assessment

## 2015-05-26 NOTE — Progress Notes (Signed)
CCMD called and reported one 2 second pause and one 2.43 second pause.  Pt was asymptomatic.  Dr. Risa Grill made aware.  No new orders.  Will continue to monitor closely.

## 2015-05-27 DIAGNOSIS — C678 Malignant neoplasm of overlapping sites of bladder: Secondary | ICD-10-CM | POA: Diagnosis not present

## 2015-05-27 LAB — BASIC METABOLIC PANEL
ANION GAP: 10 (ref 5–15)
BUN: 21 mg/dL — AB (ref 6–20)
CALCIUM: 8.4 mg/dL — AB (ref 8.9–10.3)
CO2: 23 mmol/L (ref 22–32)
Chloride: 100 mmol/L — ABNORMAL LOW (ref 101–111)
Creatinine, Ser: 1.56 mg/dL — ABNORMAL HIGH (ref 0.61–1.24)
GFR calc Af Amer: 48 mL/min — ABNORMAL LOW (ref >60)
GFR, EST NON AFRICAN AMERICAN: 42 mL/min — AB (ref >60)
Glucose, Bld: 183 mg/dL — ABNORMAL HIGH (ref 65–99)
POTASSIUM: 4.7 mmol/L (ref 3.5–5.1)
SODIUM: 133 mmol/L — AB (ref 135–145)

## 2015-05-27 LAB — CBC
HCT: 34.7 % — ABNORMAL LOW (ref 39.0–52.0)
Hemoglobin: 11.6 g/dL — ABNORMAL LOW (ref 13.0–17.0)
MCH: 35.4 pg — ABNORMAL HIGH (ref 26.0–34.0)
MCHC: 33.4 g/dL (ref 30.0–36.0)
MCV: 105.8 fL — ABNORMAL HIGH (ref 78.0–100.0)
PLATELETS: 84 10*3/uL — AB (ref 150–400)
RBC: 3.28 MIL/uL — ABNORMAL LOW (ref 4.22–5.81)
RDW: 15.2 % (ref 11.5–15.5)
WBC: 6.9 10*3/uL (ref 4.0–10.5)

## 2015-05-27 MED ORDER — DUTASTERIDE 0.5 MG PO CAPS: 0.5000 mg | ORAL_CAPSULE | Freq: Every day | ORAL | Status: DC

## 2015-05-27 MED ORDER — TAMSULOSIN HCL 0.4 MG PO CAPS: 0.4000 mg | ORAL_CAPSULE | Freq: Every day | ORAL | Status: DC

## 2015-05-27 MED ORDER — TRIMETHOPRIM 100 MG PO TABS
100.0000 mg | ORAL_TABLET | ORAL | Status: DC
Start: 2015-05-27 — End: 2016-03-08

## 2015-05-27 MED ORDER — TRAMADOL-ACETAMINOPHEN 37.5-325 MG PO TABS: 1.0000 | ORAL_TABLET | Freq: Four times a day (QID) | ORAL | Status: DC | PRN

## 2015-05-27 MED ORDER — TRIMETHOPRIM 100 MG PO TABS: 100.0000 mg | ORAL_TABLET | ORAL | Status: DC

## 2015-05-27 MED ORDER — URELLE 81 MG PO TABS
1.0000 | ORAL_TABLET | Freq: Three times a day (TID) | ORAL | Status: DC
Start: 2015-05-27 — End: 2015-07-11

## 2015-05-27 MED ORDER — URIBEL 118 MG PO CAPS: 1.0000 | ORAL_CAPSULE | Freq: Two times a day (BID) | ORAL | Status: DC | PRN

## 2015-05-27 NOTE — Discharge Instructions (Signed)
Bladder Cancer Bladder cancer is an abnormal growth of tissue in your bladder. Your bladder is the balloon-like sac in your pelvis. It collects and stores urine that comes from the kidneys through the ureters. The bladder wall is made of layers. If cancer spreads into these layers and through the wall of the bladder, it becomes more difficult to treat.  There are four stages of bladder cancer:  Stage I. Cancer at this stage occurs in the bladder's inner lining but has not invaded the muscular bladder wall.  Stage II. At this stage, cancer has invaded the bladder wall but is still confined to the bladder.  Stage III. By this stage, the cancer cells have spread through the bladder wall to surrounding tissue. They may also have spread to the prostate in men or the uterus or vagina in women.  Stage IV. By this stage, cancer cells may have spread to the lymph nodes and other organs, such as your lungs, bones, or liver. RISK FACTORS Although the cause of bladder cancer is not known, the following risk factors can increase your chances of getting bladder cancer:   Smoking.   Occupational exposures, such as rubber, leather, textile, dyes, chemicals, and paint.  Being white.  Age.   Being male.   Having chronic bladder inflammation.   Having a bladder cancer history.   Having a family history of bladder cancer (heredity).   Having had chemotherapy or radiation therapy to the pelvis.   Being exposed to arsenic.  SYMPTOMS   Blood in the urine.   Pain with urination.   Frequent bladder or urine infections.  Increase in urgency and frequency of urination. DIAGNOSIS  Your health care provider may suspect bladder cancer based on your description of urinary symptoms or based on the finding of blood or infection in the urine (especially if this has recurred several times). Other tests or procedures that may be performed include:   A narrow tube being inserted into your bladder  through your urethra (cystoscopy) in order to view the lining of your bladder for tumors.   A biopsy to sample the tumor to see if cancer is present.  If cancer is present, it will then be staged to determine its severity and extent. It is important to know how deeply into the bladder wall the cancer has grown and whether the cancer has spread to any other parts of your body. Staging may require blood tests or special scans such as a CT scan, MRI, bone scan, or chest X-ray.  TREATMENT  Once your cancer has been diagnosed and staged, you should discuss a treatment plan with your health care provider. Based on the stage of the cancer, one treatment or a combination of treatments may be recommended. The most common forms of treatment are:   Surgery. Procedures that may be done include transurethral resection and cystectomy.  Radiation therapy. This is infrequently used to treat bladder cancer.   Chemotherapy. During this treatment, drugs are used to kill cancer cells.  Immunotherapy. This is usually administered directly into the bladder. HOME CARE INSTRUCTIONS  Take medicines only as directed by your health care provider.   Maintain a healthy diet.   Consider joining a support group. This may help you learn to cope with the stress of having bladder cancer.   Seek advice to help you manage treatment side effects.   Keep all follow-up visits as directed by your health care provider.   Inform your cancer specialist if you are  stress of having bladder cancer.    Seek advice to help you manage treatment side effects.    Keep all follow-up visits as directed by your health care provider.    Inform your cancer specialist if you are admitted to the hospital.   SEEK MEDICAL CARE IF:   There is blood in your urine.   You have symptoms of a urinary tract infection. These include:    Tiredness.    Shakiness.    Weakness.    Muscle aches.    Abdominal pain.    Frequent and intense urge to urinate (in young women).    Burning feeling in the bladder or urethra during urination (in young women).  SEEK IMMEDIATE MEDICAL CARE IF:   You are unable to urinate.      This information is not intended to replace advice given to you by your health care provider. Make sure you discuss any questions you have with your health care provider.     Document Released: 01/31/2003 Document Revised: 02/18/2014 Document Reviewed: 07/21/2012  Elsevier Interactive Patient Education 2016 [employer]

## 2015-05-27 NOTE — Progress Notes (Signed)
Urology Progress Note  1 Day Post-Op   Subjective: Post op TURP     No acute urologic events overnight. Ambulation:   positive Flatus:    positive Bowel movement  negative  Pain: complete resolution  Objective:  Blood pressure 120/74, pulse 115, temperature 97.5 F (36.4 C), temperature source Oral, resp. rate 16, height 5\' 8"  (1.727 m), weight 88.905 kg (196 lb), SpO2 99 %.  Physical Exam:  General:  No acute distress, awake  Genitourinary:  Normal FU exam Foley: urine pink    I/O last 3 completed shifts: In: 4925.4 [P.O.:840; I.V.:4085.4] Out: 1630 [Urine:1600; Blood:30]  Recent Labs     05/27/15  0505  HGB  11.6*  WBC  6.9  PLT  84*    Recent Labs     05/27/15  0505  NA  133*  K  4.7  CL  100*  CO2  23  BUN  21*  CREATININE  1.56*  CALCIUM  8.4*  GFRNONAA  42*  GFRAA  48*     Recent Labs     05/26/15  0605  INR  1.05     Invalid input(s): ABG  Assessment/Plan:  Catheter removed. D/c  Pt to RTC Monday for path report. He will need u/s to see if his right hydro has resolved.

## 2015-05-27 NOTE — Progress Notes (Signed)
After foley d/c, pt voided a total of 200cc's of pink urine.    Went over all discharge information with patient and wife.  All questions answered.  Prescriptions and discharge information given to wife.  Pt wheeled out by NT.

## 2015-05-27 NOTE — Discharge Summary (Signed)
Physician Discharge Summary  Patient ID: [name] MRN: [id number] DOB/AGE: [birth year] 75 y.o.  Admit date: 05/26/2015 Discharge date: 05/27/2015  Admission Diagnoses: POSITIVE CYTOLOGY  Discharge Diagnoses:  Bladder Cancer: Trigone, bladder neck, > 5cm  Discharged Condition: Stable  Hospital Course: TUR-BT  Significant Diagnostic Studies: No results found.  Discharge Exam: Blood pressure 120/74, pulse 115, temperature 97.5 F (36.4 C), temperature source Oral, resp. rate 16, height 5\' 8"  (1.727 m), weight 88.905 kg (196 lb), SpO2 99 %.   Disposition: 01-Home or Self Care  Discharge Instructions    Discontinue IV    Complete by:  As directed      Foley catheter - discontinue    Complete by:  As directed             Medication List    STOP taking these medications        dabigatran 150 MG Caps capsule  Commonly known as:  PRADAXA      TAKE these medications        amLODipine 5 MG tablet  Commonly known as:  NORVASC  Take 5 mg by mouth every morning.     dutasteride 0.5 MG capsule  Commonly known as:  AVODART  Take 1 capsule (0.5 mg total) by mouth daily.     ICAPS Tabs  Take by mouth.     metoprolol succinate 100 MG 24 hr tablet  Commonly known as:  TOPROL-XL  Take 100 mg by mouth daily after breakfast.     omeprazole 40 MG capsule  Commonly known as:  PRILOSEC  Take 40 mg by mouth every evening.     rosuvastatin 20 MG tablet  Commonly known as:  CRESTOR  Take 20 mg by mouth 2 (two) times a week. He takes on Monday and Friday.     tamsulosin 0.4 MG Caps capsule  Commonly known as:  FLOMAX  Take 1 capsule (0.4 mg total) by mouth daily.     trimethoprim 100 MG tablet  Commonly known as:  TRIMPEX  Take 1 tablet (100 mg total) by mouth 1 day or 1 dose.     URIBEL 118 MG Caps  Take 1 capsule (118 mg total) by mouth 3 times/day as needed-between meals & bedtime. NB: Will turn urine blue-green NB: may stain clothing       Foley  out D/C Signed: Keyden Pavlov I [name] 05/27/2015, 8:53 AM

## 2015-06-05 ENCOUNTER — Other Ambulatory Visit: Payer: Self-pay | Admitting: Urology

## 2015-06-05 DIAGNOSIS — Z0000 Encounter for general adult medical examination without abnormal findings: Secondary | ICD-10-CM | POA: Diagnosis not present

## 2015-06-05 DIAGNOSIS — C672 Malignant neoplasm of lateral wall of bladder: Secondary | ICD-10-CM | POA: Diagnosis not present

## 2015-06-05 DIAGNOSIS — C670 Malignant neoplasm of trigone of bladder: Secondary | ICD-10-CM | POA: Diagnosis not present

## 2015-06-05 NOTE — Op Note (Signed)
1. Bladder tumor (> 5cm cm)  Postoperative diagnosis:  1. Bladder tumor (>5 cm)  Procedure:  1. Cystoscopy 2. Transurethral resection of bladder tumor (large) 3. Left retrograde pyelography with interpretation  Surgeon: Katrine Coho, MD Resident: Jearld Adjutant, MD  Anesthesia: General  Complications: None  Intraoperative findings:  1. Bladder tumor: was large and encompassed the entire trigone to the bladder neck. The left UO was only partially involved while the right UO was completely obscured, consistent with clinical T3 disease causing hydronephrosis.  2. Retrograde pyelography: on the left demonstrated a normal ureter and delicate collecting system with no hydro.   EBL: Minimal  Specimens: 1. Bladder tumor  Disposition of specimens: Pathology  Indication: [name] is a patient who was found to have a bladder tumor. After reviewing the management options for treatment, he elected to proceed with the above surgical procedure(s). We have discussed the potential benefits and risks of the procedure, side effects of the proposed treatment, the likelihood of the patient achieving the goals of the procedure, and any potential problems that might occur during the procedure or recuperation. Informed consent has been obtained.  Description of procedure:  The patient was taken to the operating room and general anesthesia was induced.  The patient was placed in the dorsal lithotomy position, prepped and draped in the usual sterile fashion, and preoperative antibiotics were administered. A preoperative time-out was performed.   Cystourethroscopy was performed and showed a high ridding prostate with bladder tumor along the entir trigone, particularly on the right side. We switched to our 26 [place] resectoscope with Beatrix Fetters working element and electrocautery loop with which we performed transurethral resection of the bladder tumor. We carefully resected down to a depth of muscularis  propria. The UOs were likely involved bilaterally and were resected across with cut current only. Fluorescein dye was injected IV and we were able to see eflux from the left UO but never from the right consistent with the obstruction seen on CT. We achieved satisfactory hemostasis along the way using bipolar electrocautery. Once complete, we irrigated and evacuated the patient's bladder several times using [place] syringe and bladder filling and evacuationand all resection chips were sent for Pathology labeled bladder tumor. We then fulgurated theperiphery of our resection site circumferentially. We then ensuredsatisfactory hemostasis with the bladder relatively empty. At this point, bladder was left full and all instrumentation was removed. We inserted a 18 French 2 way foley catheter with 10 mL water in the balloon. Catheter was placed to drainage and CBI was attached but left in off position. Draining urine was clear and without clot. The patient was then awoken from anesthesia and taken to recovery area.

## 2015-07-21 ENCOUNTER — Other Ambulatory Visit: Payer: Self-pay | Admitting: Cardiovascular Disease

## 2015-07-21 NOTE — Patient Instructions (Signed)
[name]  07/21/2015   Your procedure is scheduled on: 07/28/2015    Report to [facility] Main  Entrance take [place]  elevators to 3rd floor to  [place] at    0700 AM.  Call this number if you have problems the morning of surgery [phone]   Remember: ONLY 1 PERSON MAY GO WITH YOU TO SHORT STAY TO GET  READY MORNING OF [place].  Do not eat food or drink liquids :After Midnight.     Take these medicines the morning of surgery with A SIP OF WATER: Amlodipine ( Norvasc), Avodart, Metoprolol ( Toprol), Flomax                                 You may not have any metal on your body including hair pins and              piercings  Do not wear jewelry, , lotions, powders or perfumes, deodorant                      Men may shave face and neck.   Do not bring valuables to the hospital. [place].  Contacts, dentures or bridgework may not be worn into surgery.      Patients discharged the day of surgery will not be allowed to drive home.  Name and phone number of your driver:  Special Instructions: coughing and deep breathing exercises, leg exercises               Please read over the following fact sheets you were given: _____________________________________________________________________             [facility] - Preparing for Surgery Before surgery, you can play an important role.  Because skin is not sterile, your skin needs to be as free of germs as possible.  You can reduce the number of germs on your skin by washing with CHG (chlorahexidine gluconate) soap before surgery.  CHG is an antiseptic cleaner which kills germs and bonds with the skin to continue killing germs even after washing. Please DO NOT use if you have an allergy to CHG or antibacterial soaps.  If your skin becomes reddened/irritated stop using the CHG and inform your nurse when you arrive at Short Stay. Do not shave  (including legs and underarms) for at least 48 hours prior to the first CHG shower.  You may shave your face/neck. Please follow these instructions carefully:  1.  Shower with CHG Soap the night before surgery and the  morning of Surgery.  2.  If you choose to wash your hair, wash your hair first as usual with your  normal  shampoo.  3.  After you shampoo, rinse your hair and body thoroughly to remove the  shampoo.                           4.  Use CHG as you would any other liquid soap.  You can apply chg directly  to the skin and wash                       Gently with  a scrungie or clean washcloth.  5.  Apply the CHG Soap to your body ONLY FROM THE NECK DOWN.   Do not use on face/ open                           Wound or open sores. Avoid contact with eyes, ears mouth and genitals (private parts).                       Wash face,  Genitals (private parts) with your normal soap.             6.  Wash thoroughly, paying special attention to the area where your surgery  will be performed.  7.  Thoroughly rinse your body with warm water from the neck down.  8.  DO NOT shower/wash with your normal soap after using and rinsing off  the CHG Soap.                9.  Pat yourself dry with a clean towel.            10.  Wear clean pajamas.            11.  Place clean sheets on your bed the night of your first shower and do not  sleep with pets. Day of Surgery : Do not apply any lotions/deodorants the morning of surgery.  Please wear clean clothes to the hospital/surgery center.  FAILURE TO FOLLOW THESE INSTRUCTIONS MAY RESULT IN THE CANCELLATION OF YOUR SURGERY PATIENT SIGNATURE_________________________________  NURSE SIGNATURE__________________________________  ________________________________________________________________________

## 2015-07-24 ENCOUNTER — Encounter (HOSPITAL_COMMUNITY): Payer: Self-pay

## 2015-07-24 ENCOUNTER — Encounter (HOSPITAL_COMMUNITY)
Admission: RE | Admit: 2015-07-24 | Discharge: 2015-07-24 | Disposition: A | Discharge status: Home / Self Care | Payer: PPO | Source: Ambulatory Visit | Attending: Urology | Admitting: Urology

## 2015-07-24 DIAGNOSIS — Z01812 Encounter for preprocedural laboratory examination: Secondary | ICD-10-CM | POA: Diagnosis not present

## 2015-07-24 DIAGNOSIS — C679 Malignant neoplasm of bladder, unspecified: Secondary | ICD-10-CM | POA: Insufficient documentation

## 2015-07-24 LAB — BASIC METABOLIC PANEL
ANION GAP: 6 (ref 5–15)
BUN: 21 mg/dL — ABNORMAL HIGH (ref 6–20)
CALCIUM: 9.2 mg/dL (ref 8.9–10.3)
CO2: 27 mmol/L (ref 22–32)
CREATININE: 2.01 mg/dL — AB (ref 0.61–1.24)
Chloride: 105 mmol/L (ref 101–111)
GFR calc Af Amer: 36 mL/min — ABNORMAL LOW (ref >60)
GFR, EST NON AFRICAN AMERICAN: 31 mL/min — AB (ref >60)
GLUCOSE: 119 mg/dL — AB (ref 65–99)
Potassium: 4.6 mmol/L (ref 3.5–5.1)
Sodium: 138 mmol/L (ref 135–145)

## 2015-07-24 LAB — CBC
HEMATOCRIT: 36 % — AB (ref 39.0–52.0)
Hemoglobin: 11.9 g/dL — ABNORMAL LOW (ref 13.0–17.0)
MCH: 34.8 pg — ABNORMAL HIGH (ref 26.0–34.0)
MCHC: 33.1 g/dL (ref 30.0–36.0)
MCV: 105.3 fL — AB (ref 78.0–100.0)
PLATELETS: 89 10*3/uL — AB (ref 150–400)
RBC: 3.42 MIL/uL — AB (ref 4.22–5.81)
RDW: 15.5 % (ref 11.5–15.5)
WBC: 6.4 10*3/uL (ref 4.0–10.5)

## 2015-07-24 NOTE — Progress Notes (Signed)
CBC and BMP done 07/24/15 faxed via EPIC to Dr Gaynelle Arabian.

## 2015-07-24 NOTE — Progress Notes (Signed)
EKG- 05/17/15- EPIC  05/17/15- LOV- Cardiology- EPIC

## 2015-07-27 NOTE — Anesthesia Preprocedure Evaluation (Addendum)
Anesthesia Evaluation  Patient identified by MRN, date of birth, ID band Patient awake    Reviewed: Allergy & Precautions, H&P , NPO status , Patient's Chart, lab work & pertinent test results, reviewed documented beta blocker date and time   Airway Mallampati: III  TM Distance: >3 FB Neck ROM: Full    Dental no notable dental hx. (+) Teeth Intact, Dental Advisory Given   Pulmonary neg pulmonary ROS, former smoker,    Pulmonary exam normal breath sounds clear to auscultation       Cardiovascular hypertension, Pt. on medications and Pt. on home beta blockers + CAD and + Past MI  + dysrhythmias Atrial Fibrillation  Rhythm:Regular Rate:Normal     Neuro/Psych negative neurological ROS  negative psych ROS   GI/Hepatic negative GI ROS, Neg liver ROS,   Endo/Other  negative endocrine ROS  Renal/GU Renal InsufficiencyRenal disease  negative genitourinary   Musculoskeletal   Abdominal   Peds  Hematology negative hematology ROS (+) anemia ,   Anesthesia Other Findings   Reproductive/Obstetrics negative OB ROS                            Anesthesia Physical Anesthesia Plan  ASA: III  Anesthesia Plan: General   Post-op Pain Management:    Induction: Intravenous  Airway Management Planned: LMA  Additional Equipment:   Intra-op Plan:   Post-operative Plan: Extubation in OR  Informed Consent: I have reviewed the patients History and Physical, chart, labs and discussed the procedure including the risks, benefits and alternatives for the proposed anesthesia with the patient or authorized representative who has indicated his/her understanding and acceptance.   Dental advisory given  Plan Discussed with: CRNA  Anesthesia Plan Comments:        Anesthesia Quick Evaluation

## 2015-07-28 ENCOUNTER — Encounter (HOSPITAL_COMMUNITY)
Admission: RE | Disposition: A | Discharge status: Home / Self Care | Payer: Self-pay | Source: Ambulatory Visit | Attending: Urology

## 2015-07-28 ENCOUNTER — Ambulatory Visit (HOSPITAL_COMMUNITY): Payer: PPO | Admitting: Anesthesiology

## 2015-07-28 ENCOUNTER — Ambulatory Visit (HOSPITAL_COMMUNITY)
Admission: RE | Admit: 2015-07-28 | Discharge: 2015-07-28 | Disposition: A | Discharge status: Home / Self Care | Payer: PPO | Source: Ambulatory Visit | Attending: Urology | Admitting: Urology

## 2015-07-28 ENCOUNTER — Encounter (HOSPITAL_COMMUNITY): Payer: Self-pay | Admitting: *Deleted

## 2015-07-28 DIAGNOSIS — I2510 Atherosclerotic heart disease of native coronary artery without angina pectoris: Secondary | ICD-10-CM | POA: Insufficient documentation

## 2015-07-28 DIAGNOSIS — I129 Hypertensive chronic kidney disease with stage 1 through stage 4 chronic kidney disease, or unspecified chronic kidney disease: Secondary | ICD-10-CM | POA: Diagnosis not present

## 2015-07-28 DIAGNOSIS — D090 Carcinoma in situ of bladder: Secondary | ICD-10-CM | POA: Insufficient documentation

## 2015-07-28 DIAGNOSIS — I252 Old myocardial infarction: Secondary | ICD-10-CM | POA: Diagnosis not present

## 2015-07-28 DIAGNOSIS — Z87891 Personal history of nicotine dependence: Secondary | ICD-10-CM | POA: Insufficient documentation

## 2015-07-28 DIAGNOSIS — C670 Malignant neoplasm of trigone of bladder: Secondary | ICD-10-CM | POA: Diagnosis not present

## 2015-07-28 DIAGNOSIS — Z7901 Long term (current) use of anticoagulants: Secondary | ICD-10-CM | POA: Diagnosis not present

## 2015-07-28 DIAGNOSIS — Z8551 Personal history of malignant neoplasm of bladder: Secondary | ICD-10-CM | POA: Insufficient documentation

## 2015-07-28 DIAGNOSIS — Z79899 Other long term (current) drug therapy: Secondary | ICD-10-CM | POA: Insufficient documentation

## 2015-07-28 DIAGNOSIS — D494 Neoplasm of unspecified behavior of bladder: Secondary | ICD-10-CM

## 2015-07-28 DIAGNOSIS — I10 Essential (primary) hypertension: Secondary | ICD-10-CM | POA: Diagnosis not present

## 2015-07-28 DIAGNOSIS — I4891 Unspecified atrial fibrillation: Secondary | ICD-10-CM | POA: Diagnosis not present

## 2015-07-28 DIAGNOSIS — Z9861 Coronary angioplasty status: Secondary | ICD-10-CM | POA: Diagnosis not present

## 2015-07-28 DIAGNOSIS — C679 Malignant neoplasm of bladder, unspecified: Secondary | ICD-10-CM | POA: Diagnosis not present

## 2015-07-28 DIAGNOSIS — N189 Chronic kidney disease, unspecified: Secondary | ICD-10-CM | POA: Diagnosis not present

## 2015-07-28 HISTORY — PX: CYSTOSCOPY: SHX5120

## 2015-07-28 HISTORY — PX: TRANSURETHRAL RESECTION OF BLADDER TUMOR: SHX2575

## 2015-07-28 LAB — APTT: APTT: 32 s (ref 24–37)

## 2015-07-28 SURGERY — CYSTOSCOPY
Anesthesia: General

## 2015-07-28 MED ORDER — PHENYLEPHRINE HCL 10 MG/ML IJ SOLN
INTRAMUSCULAR | Status: DC | PRN
  Administered 2015-07-28: 80 ug via INTRAVENOUS

## 2015-07-28 MED ORDER — EPHEDRINE SULFATE 50 MG/ML IJ SOLN
INTRAMUSCULAR | Status: DC | PRN
  Administered 2015-07-28: 10 mg via INTRAVENOUS

## 2015-07-28 MED ORDER — CEFAZOLIN SODIUM-DEXTROSE 2-4 GM/100ML-% IV SOLN
2.0000 g | INTRAVENOUS | Status: AC
  Administered 2015-07-28: 2 g via INTRAVENOUS

## 2015-07-28 MED ORDER — LIDOCAINE HCL (CARDIAC) 20 MG/ML IV SOLN
INTRAVENOUS | Status: DC | PRN
  Administered 2015-07-28: 80 mg via INTRATRACHEAL

## 2015-07-28 MED ORDER — URIBEL 118 MG PO CAPS: 1.0000 | ORAL_CAPSULE | Freq: Two times a day (BID) | ORAL | Status: DC | PRN

## 2015-07-28 MED ORDER — BELLADONNA ALKALOIDS-OPIUM 16.2-60 MG RE SUPP
RECTAL | Status: AC
  Filled 2015-07-28: qty 1

## 2015-07-28 MED ORDER — LACTATED RINGERS IV SOLN
INTRAVENOUS | Status: DC | PRN
  Administered 2015-07-28: 09:00:00 via INTRAVENOUS

## 2015-07-28 MED ORDER — PROPOFOL 10 MG/ML IV BOLUS
INTRAVENOUS | Status: DC | PRN
Start: 2015-07-28 — End: 2015-07-28
  Administered 2015-07-28: 150 mg via INTRAVENOUS

## 2015-07-28 MED ORDER — FENTANYL CITRATE (PF) 100 MCG/2ML IJ SOLN
INTRAMUSCULAR | Status: DC | PRN
  Administered 2015-07-28: 25 ug via INTRAVENOUS
  Administered 2015-07-28: 50 ug via INTRAVENOUS
  Administered 2015-07-28: 25 ug via INTRAVENOUS
  Administered 2015-07-28: 50 ug via INTRAVENOUS

## 2015-07-28 MED ORDER — KETOROLAC TROMETHAMINE 30 MG/ML IJ SOLN: 15.0000 mg | Freq: Once | INTRAMUSCULAR | Status: DC

## 2015-07-28 MED ORDER — PHENYLEPHRINE 40 MCG/ML (10ML) SYRINGE FOR IV PUSH (FOR BLOOD PRESSURE SUPPORT)
PREFILLED_SYRINGE | INTRAVENOUS | Status: AC
  Filled 2015-07-28: qty 10

## 2015-07-28 MED ORDER — EPHEDRINE SULFATE 50 MG/ML IJ SOLN
INTRAMUSCULAR | Status: AC
  Filled 2015-07-28: qty 1

## 2015-07-28 MED ORDER — BELLADONNA ALKALOIDS-OPIUM 16.2-60 MG RE SUPP
RECTAL | Status: DC | PRN
  Administered 2015-07-28: 1 via RECTAL

## 2015-07-28 MED ORDER — PROPOFOL 10 MG/ML IV BOLUS
INTRAVENOUS | Status: AC
  Filled 2015-07-28: qty 20

## 2015-07-28 MED ORDER — ONDANSETRON HCL 4 MG/2ML IJ SOLN
INTRAMUSCULAR | Status: AC
  Filled 2015-07-28: qty 2

## 2015-07-28 MED ORDER — LACTATED RINGERS IV SOLN
INTRAVENOUS | Status: DC
  Administered 2015-07-28: 09:00:00 via INTRAVENOUS

## 2015-07-28 MED ORDER — ACETAMINOPHEN 500 MG PO TABS
1000.0000 mg | ORAL_TABLET | Freq: Once | ORAL | Status: AC | PRN
  Administered 2015-07-28: 1000 mg via ORAL
  Filled 2015-07-28: qty 2

## 2015-07-28 MED ORDER — SODIUM CHLORIDE 0.9 % IR SOLN
Status: DC | PRN
  Administered 2015-07-28: 6000 mL

## 2015-07-28 MED ORDER — DABIGATRAN ETEXILATE MESYLATE 150 MG PO CAPS
ORAL_CAPSULE | ORAL | Status: DC
Start: 2015-07-28 — End: 2016-03-12

## 2015-07-28 MED ORDER — FENTANYL CITRATE (PF) 100 MCG/2ML IJ SOLN
INTRAMUSCULAR | Status: AC
  Filled 2015-07-28: qty 2

## 2015-07-28 MED ORDER — KETOROLAC TROMETHAMINE 30 MG/ML IJ SOLN
INTRAMUSCULAR | Status: AC
  Filled 2015-07-28: qty 1

## 2015-07-28 MED ORDER — KETOROLAC TROMETHAMINE 30 MG/ML IJ SOLN
INTRAMUSCULAR | Status: DC | PRN
  Administered 2015-07-28: 30 mg via INTRAVENOUS

## 2015-07-28 MED ORDER — FENTANYL CITRATE (PF) 100 MCG/2ML IJ SOLN: 25.0000 ug | INTRAMUSCULAR | Status: DC | PRN

## 2015-07-28 MED ORDER — ONDANSETRON HCL 4 MG/2ML IJ SOLN
INTRAMUSCULAR | Status: DC | PRN
  Administered 2015-07-28: 4 mg via INTRAVENOUS

## 2015-07-28 MED ORDER — TRAMADOL-ACETAMINOPHEN 37.5-325 MG PO TABS: 1.0000 | ORAL_TABLET | Freq: Four times a day (QID) | ORAL | Status: DC | PRN

## 2015-07-28 MED ORDER — LIDOCAINE HCL (CARDIAC) 20 MG/ML IV SOLN
INTRAVENOUS | Status: AC
  Filled 2015-07-28: qty 5

## 2015-07-28 MED ORDER — CEFAZOLIN SODIUM-DEXTROSE 2-4 GM/100ML-% IV SOLN
INTRAVENOUS | Status: AC
  Filled 2015-07-28: qty 100

## 2015-07-28 MED ORDER — FLUORESCEIN SODIUM 10 % IV SOLN
INTRAVENOUS | Status: AC
  Filled 2015-07-28: qty 5

## 2015-07-28 SURGICAL SUPPLY — 18 items
BAG URINE DRAINAGE (UROLOGICAL SUPPLIES) IMPLANT
BAG URO CATCHER STRL LF (MISCELLANEOUS) ×3 IMPLANT
CATH INTERMIT  6FR 70CM (CATHETERS) ×3 IMPLANT
CLOTH BEACON ORANGE TIMEOUT ST (SAFETY) ×3 IMPLANT
GLOVE BIOGEL M STRL SZ7.5 (GLOVE) ×3 IMPLANT
GOWN STRL REUS W/TWL LRG LVL3 (GOWN DISPOSABLE) ×3 IMPLANT
GOWN STRL REUS W/TWL XL LVL3 (GOWN DISPOSABLE) ×3 IMPLANT
GUIDEWIRE STR DUAL SENSOR (WIRE) ×3 IMPLANT
HOLDER FOLEY CATH W/STRAP (MISCELLANEOUS) IMPLANT
IV NS IRRIG 3000ML ARTHROMATIC (IV SOLUTION) ×6 IMPLANT
LOOP CUT BIPOLAR 24F LRG (ELECTROSURGICAL) ×3 IMPLANT
MANIFOLD NEPTUNE II (INSTRUMENTS) ×3 IMPLANT
NS IRRIG 1000ML POUR BTL (IV SOLUTION) ×3 IMPLANT
PACK CYSTO (CUSTOM PROCEDURE TRAY) ×3 IMPLANT
SCRUB PCMX 4 OZ (MISCELLANEOUS) ×3 IMPLANT
SYRINGE IRR TOOMEY STRL 70CC (SYRINGE) ×3 IMPLANT
TUBING CONNECTING 10 (TUBING) ×4 IMPLANT
TUBING CONNECTING 10' (TUBING) ×2

## 2015-07-28 NOTE — Op Note (Signed)
Pre-operative diagnosis :  TCC bladder base, trigone, high grade, non-invasive  Postoperative diagnosis:  Same  Operation:  2nd look, cystoscopy, Trans urethral bladder biopsies   Surgeon:  S. Gaynelle Arabian, MD  First assistant:  None  Anesthesia: General LMA  Preparation:  After appropriate preanesthesia, the patient was brought to the operative room, placed on the operating table in the dorsal supine position where general LMA anesthesia was introduced. He was replaced in the dorsal lithotomy position with pubis was prepped with Betadine solution and draped in usual fashion.  Review history:  Active Problems Problems  1. 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 He is s/p cysto/TURBT on 05/26/15. Hx of asymptomatic gross hematuria that was noted on 05/01/15, but resolved next morning. He has been on Pradaxa for 18 months as a cardiac preventative (Hx of MI 34, Angioplasty, 1979, 1983). No dysuria, no flank pain, no hx of tobacco use, no fever or chills, no abdominal pain. He denies any problem voiding.      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 . He has normal bowel movements now. No abdominal pain.   Statement of  Likelihood of Success: Excellent. TIME-OUT observed.:  Procedure:  Cystourethroscopy was accomplished, showing normal-appearing urethra. The prostate itself showed bilobar BPH, with elevated bladder neck. Trabeculation was noted within the bladder, which is noted to be mild. There was no evidence of cellule formation. There was no bladder stone. The trigone was scarred, as was the  bladder base. Some edema was identified. The patient was given 1/2 mL of flow or seen die, and immediate yellow color was identified. The left ureteral orifice was identified as a large slit on the left trigone. The right ureteral orifice was poorly identified, however. Areas of previous tumor resection were rebiopsied. Areas of edema were also biopsied. These were biopsied with the electric loop. Any bleeding was electrocoagulated. The bladder was irrigated free from biopsies, which were sent the laboratory for evaluation. Some blood clots were identified and these were irrigated free as and discarded.  No catheter was left per the patient's wishes.  The patient was given IV Toradol, and awakened, and taken to recovery room in good condition. He also had AV and O suppository placed. He will be observed for voiding prior to discharge.

## 2015-07-28 NOTE — Anesthesia Procedure Notes (Signed)
Procedure Name: LMA Insertion Date/Time: 07/28/2015 9:37 AM Performed by: [place] Marguarite Markov C Pre-anesthesia Checklist: Patient identified, Emergency Drugs available, Suction available, Patient being monitored and Timeout performed Patient Re-evaluated:Patient Re-evaluated prior to inductionOxygen Delivery Method: Circle system utilized Preoxygenation: Pre-oxygenation with 100% oxygen Intubation Type: IV induction LMA: LMA inserted LMA Size: 4.0 Tube type: Oral Number of attempts: 1 Placement Confirmation: positive ETCO2,  CO2 detector and breath sounds checked- equal and bilateral Tube secured with: Tape Dental Injury: Teeth and Oropharynx as per pre-operative assessment

## 2015-07-28 NOTE — H&P (Signed)
Reason For Visit 1 week f/u   Active Problems Problems  1. 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 He is s/p cysto/TURBT on 05/26/15. Hx of asymptomatic gross hematuria that was noted on 05/01/15, but resolved next morning. He has been on Pradaxa for 18 months as a cardiac preventative (Hx of MI 34, Angioplasty, 1979, 1983). No dysuria, no flank pain, no hx of tobacco use, no fever or chills, no abdominal pain. He denies any problem voiding.       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 . He has normal bowel movements now. No abdominal pain.   Past Medical History Problems  1. History of acute myocardial infarction (I25.2) 2. History of atrial fibrillation (Z86.79) 3. History of cardiac disorder (Z86.79) 4. History of gastric ulcer (Z87.19) 5. History of hypertension (Z86.79) 6. 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 History of Back Surgery 2. History of Carotid Artery Exploration 3. History of Colostomy 4. History of Colostomy Closure 5. History of Gastric Surgery  Current Meds 1. Allopurinol TABS;  Therapy: (Recorded:21Mar2017) to Recorded 2. AmLODIPine Besylate 5 MG Oral Tablet;  Therapy: (Recorded:15Apr2015) to Recorded 3. Crestor 20 MG Oral Tablet;  Therapy: (Recorded:15Apr2015) to Recorded 4. DiazePAM 10 MG Oral Tablet; Take tablet 1 hour prior to procedure;  Therapy: [id number] to (Last Rx:31Mar2017) Ordered 5. Dutasteride 0.5 MG Oral Capsule; TAKE 1 CAPSULE EVERY DAY;  Therapy: 06Apr2017 to (Evaluate:05Jul2017)   Requested for: 06Apr2017; Last  Rx:06Apr2017 Ordered 6. Metoprolol Tartrate 100 MG Oral Tablet;  Therapy: (Recorded:15Apr2015) to Recorded 7. Pradaxa 150 MG Oral Capsule;  Therapy: (Recorded:15Apr2015) to Recorded 8. Tamsulosin HCl - 0.4 MG Oral Capsule;  Therapy: (Recorded:24Apr2017) to Recorded 9. Tramadol-Acetaminophen 37.5-325 MG Oral Tablet;  Therapy: (Recorded:24Apr2017) to Recorded 10. Trimethoprim 100 MG Oral Tablet;   Therapy: (Recorded:24Apr2017) to Recorded 11. Uribel 118 MG Oral Capsule;   Therapy: (Recorded:24Apr2017) to Recorded  Allergies Medication  1. No Known Drug Allergies  Family History Problems  1. Family history of malignant neoplasm (Z80.9) : Father  Social History Problems  1. Alcohol use (Z78.9)   3 per day 2. Caffeine use (F15.90)   2 per day 3. Father deceased 10. Former smoker (Z87.891)   3 ppd x 42 yrs 5. Former smoker [phone]) 6. Mother deceased 22. Number of children   3 sons 15. Occupation   Teacher, music  Review of Systems Genitourinary, constitutional, skin, eye, otolaryngeal, hematologic/lymphatic, cardiovascular, pulmonary, endocrine, musculoskeletal, gastrointestinal, neurological and psychiatric system(s) were reviewed and pertinent findings if present are noted and are otherwise negative.  Genitourinary: nocturia, difficulty starting the urinary stream, weak urinary stream, urinary stream starts and stops, incomplete emptying of bladder, hematuria and initiating urination requires straining, but no urinary frequency, no feelings of urinary urgency, no dysuria, no incontinence and no post-void dribbling.  Gastrointestinal: no nausea, no vomiting, no flank pain, no diarrhea and no constipation.  Integumentary: pruritus.  Respiratory: no shortness of breath, no cough and no shortness of breath during exertion.    Physical Exam Constitutional: Well nourished and well  developed . No acute distress.  ENT:. The ears and nose are normal in  appearance.  Neck: The appearance of the neck is normal and no neck mass is present.  Pulmonary: No respiratory distress.  Cardiovascular: Heart rate and rhythm are normal.  Abdomen: The abdomen is rounded. Bowel sounds are normal.  Rectal: Rectal exam demonstrates normal sphincter tone, no tenderness and no masses. Estimated prostate size is 3+. The prostate has no nodularity, is not indurated and is not tender. The left seminal vesicle is nonpalpable. The right seminal vesicle is nonpalpable. The perineum is normal on inspection.  Genitourinary: Examination of the penis demonstrates no discharge, no masses, no lesions and a normal meatus. The penis is circumcised. The scrotum is without lesions. The right epididymis is palpably normal and non-tender. The left epididymis is palpably normal and non-tender. The right testis is non-tender and without masses. The left testis is non-tender and without masses.  Lymphatics: The femoral and inguinal nodes are not enlarged or tender.  Skin: Normal skin turgor, no visible rash and no visible skin lesions.  Neuro/Psych:. Mood and affect are appropriate.    Results/Data Urine [Data Includes: Last 1 Day]   24Apr2017  COLOR RED   APPEARANCE CLOUDY   SPECIFIC GRAVITY 1.010   pH 7.0   GLUCOSE NEGATIVE   BILIRUBIN NEGATIVE   KETONE NEGATIVE   BLOOD 3+   PROTEIN 3+   NITRITE POSITIVE   LEUKOCYTE ESTERASE 2+   SQUAMOUS EPITHELIAL/HPF 0-5 HPF  WBC 0-5 WBC/HPF  RBC >60 RBC/HPF  BACTERIA NONE SEEN HPF  CRYSTALS NONE SEEN HPF  CASTS NONE SEEN LPF  Yeast NONE SEEN HPF   Assessment Assessed  1. 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 He will have 2nd look, and will have repeat bx, followed by BCG induction.   Plan Health Maintenance  1. UA With REFLEX; [Do Not Release]; Status:Resulted - Requires Verification;   [id number]  04:07PM  Repeat cysto/bladder biopsy at 6 weeks post op .   Stop Pradaxa 3 days before next biopsy.   Discussion/Summary cc: Berneta Sages. MD   cc: Marius Ditch, MD     Signatures

## 2015-07-28 NOTE — Transfer of Care (Signed)
Immediate Anesthesia Transfer of Care Note  Patient: [name]  Procedure(s) Performed: Procedure(s): CYSTOSCOPY (N/A) TRANSURETHRAL RESECTION OF BLADDER  BIOPSY (N/A)  Patient Location: PACU  Anesthesia Type:General  Level of Consciousness: awake, alert  and oriented  Airway & Oxygen Therapy: Patient Spontanous Breathing and Patient connected to face mask oxygen  Post-op Assessment: Report given to RN and Post -op Vital signs reviewed and stable  Post vital signs: Reviewed and stable  Last Vitals:  Filed Vitals:   07/28/15 0708  BP: 119/72  Pulse: 74  Temp: 36.8 C  Resp: 18    Last Pain: There were no vitals filed for this visit.    Patients Stated Pain Goal: 4 ([id number])  Complications: No apparent anesthesia complications

## 2015-07-28 NOTE — Interval H&P Note (Signed)
History and Physical Interval Note:  07/28/2015 9:30 AM  [name]  has presented today for surgery, with the diagnosis of TRANSITIONAL CELL CARCINOMA BLADDER  The various methods of treatment have been discussed with the patient and family. After consideration of risks, benefits and other options for treatment, the patient has consented to  Procedure(s): CYSTOSCOPY (N/A) TRANSURETHRAL RESECTION OF BLADDER TUMOR (TURBT) WITH BIOPSY (N/A) as a surgical intervention .  The patient's history has been reviewed, patient examined, no change in status, stable for surgery.  I have reviewed the patient's chart and labs.  Questions were answered to the patient's satisfaction.     Dariusz Brase I Cena Bruhn

## 2015-07-28 NOTE — Discharge Instructions (Addendum)
Bladder Cancer Bladder cancer is an abnormal growth of tissue in your bladder. Your bladder is the balloon-like sac in your pelvis. It collects and stores urine that comes from the kidneys through the ureters. The bladder wall is made of layers. If cancer spreads into these layers and through the wall of the bladder, it becomes more difficult to treat.  There are four stages of bladder cancer:  Stage I. Cancer at this stage occurs in the bladder's inner lining but has not invaded the muscular bladder wall.  Stage II. At this stage, cancer has invaded the bladder wall but is still confined to the bladder.  Stage III. By this stage, the cancer cells have spread through the bladder wall to surrounding tissue. They may also have spread to the prostate in men or the uterus or vagina in women.  Stage IV. By this stage, cancer cells may have spread to the lymph nodes and other organs, such as your lungs, bones, or liver. RISK FACTORS Although the cause of bladder cancer is not known, the following risk factors can increase your chances of getting bladder cancer:   Smoking.   Occupational exposures, such as rubber, leather, textile, dyes, chemicals, and paint.  Being white.  Age.   Being male.   Having chronic bladder inflammation.   Having a bladder cancer history.   Having a family history of bladder cancer (heredity).   Having had chemotherapy or radiation therapy to the pelvis.   Being exposed to arsenic.  SYMPTOMS   Blood in the urine.   Pain with urination.   Frequent bladder or urine infections.  Increase in urgency and frequency of urination. DIAGNOSIS  Your health care provider may suspect bladder cancer based on your description of urinary symptoms or based on the finding of blood or infection in the urine (especially if this has recurred several times). Other tests or procedures that may be performed include:   A narrow tube being inserted into your bladder  through your urethra (cystoscopy) in order to view the lining of your bladder for tumors.   A biopsy to sample the tumor to see if cancer is present.  If cancer is present, it will then be staged to determine its severity and extent. It is important to know how deeply into the bladder wall the cancer has grown and whether the cancer has spread to any other parts of your body. Staging may require blood tests or special scans such as a CT scan, MRI, bone scan, or chest X-ray.  TREATMENT  Once your cancer has been diagnosed and staged, you should discuss a treatment plan with your health care provider. Based on the stage of the cancer, one treatment or a combination of treatments may be recommended. The most common forms of treatment are:   Surgery. Procedures that may be done include transurethral resection and cystectomy.  Radiation therapy. This is infrequently used to treat bladder cancer.   Chemotherapy. During this treatment, drugs are used to kill cancer cells.  Immunotherapy. This is usually administered directly into the bladder. HOME CARE INSTRUCTIONS  Take medicines only as directed by your health care provider.   Maintain a healthy diet.   Consider joining a support group. This may help you learn to cope with the stress of having bladder cancer.   Seek advice to help you manage treatment side effects.   Keep all follow-up visits as directed by your health care provider.   Inform your cancer specialist if you are  admitted to the hospital.  [place] IF:  There is blood in your urine.  You have symptoms of a urinary tract infection. These include:  Tiredness.  Shakiness.  Weakness.  Muscle aches.  Abdominal pain.  Frequent and intense urge to urinate (in young women).  Burning feeling in the bladder or urethra during urination (in young women). SEEK IMMEDIATE MEDICAL CARE IF:  You are unable to urinate.   This information is not intended to  replace advice given to you by your health care provider. Make sure you discuss any questions you have with your health care provider.   Document Released: 01/31/2003 Document Revised: 02/18/2014 Document Reviewed: 07/21/2012 Elsevier Interactive Patient Education 2016 Elsevier Inc.  Transurethral Resection, Bladder Tumor A cancerous growth (tumor) can develop on the inside wall of the bladder. The bladder is the organ that holds urine. One way to remove the tumor is a procedure called a transurethral resection. The tumor is removed (resected) through the tube that carries urine from the bladder out of the body (urethra). No cuts (incisions) are made in the skin. Instead, the procedure is done through a thin telescope, called a resectoscope. Attached to it is a light and usually a tiny camera. The resectoscope is put into the urethra. In men, the urethra opens at the end of the penis. In women, it opens just above the vagina.  A transurethral resection is usually used to remove tumors that have not gotten too big or too deep. These are called Stage 0, Stage 1 or Stage 2 bladder cancers. LET YOUR CAREGIVER KNOW ABOUT:  On the day of the procedure, your caregivers will need to know the last time you had anything to eat or drink. This includes water, gum, and candy. In advance, make sure they know about:   Any allergies.  All medications you are taking, including:  Herbs, eyedrops, over-the-counter medications and creams.  Blood thinners (anticoagulants), aspirin or other drugs that could affect blood clotting.  Use of steroids (by mouth or as creams).  Previous problems with anesthetics, including local anesthetics.  Possibility of pregnancy, if this applies.  Any history of blood clots.  Any history of bleeding or other blood problems.  Previous surgery.  Smoking history.  Any recent symptoms of colds or infections.  Other health problems. RISKS AND COMPLICATIONS This is usually  a safe procedure. Every procedure has risks, though. For a transurethral resection, they include:  Infection. Antibiotic medication would need to be taken.  Bleeding.  Light bleeding may last for several days after the procedure.  If bleeding continues or is heavy, the bladder may need rinsing. Or, a new catheter might be put in for awhile.  Sometimes bed rest is needed.  Urination problems.  Pain and burning can occur when urinating. This usually goes away in a few days.  Scarring from the procedure can block the flow of urine.  Bladder damage.  It can be punctured or torn during removal of the tumor. If this happens, a catheter might be needed for longer. Antibiotics would be taken while the bladder heals.  Urine can leak through the hole or tear into the abdomen. If this happens, surgery may be needed to repair the bladder. BEFORE THE PROCEDURE   A medical evaluation will be done. This may include:  A physical examination.  Urine test. This is to make sure you do not have a urinary tract infection.  Blood tests.  A test that checks the heart's rhythm (  electrocardiogram).  Talking with an anesthesiologist. This is the person who will be in charge of the medication (anesthesia) to keep you from feeling pain during the transurethral resection. You might be asleep during the procedure (general anesthesia) or numb from the waist down, but awake during the procedure (spinal anesthesia). Ask your surgeon what to expect.  The person who is having a transurethral resection needs to give what is called informed consent. This requires signing a legal paper that gives permission for the procedure. To give informed consent:  You must understand how the procedure is done and why.  You must be told all the risks and benefits of the procedure.  You must sign the consent. Sometimes a legal guardian can do this.  Signing should be witnessed by a healthcare professional.  The day  before the surgery, eat only a light dinner. Then, do not eat or drink anything for at least 8 hours before the surgery. Ask your caregiver if it is OK to take any needed medicines with a sip of water.  Arrive at least an hour before the surgery or whenever your surgeon recommends. This will give you time to check in and fill out any needed paperwork. PROCEDURE  The preparation:  You will change into a hospital gown.  A needle will be inserted in your arm. This is an intravenous access tube (IV). Medication will be able to flow directly into your body through this needle.  Small monitors will be put on your body. They are used to check your heart, blood pressure, and oxygen level.  You might be given medication that will help you relax (sedative).  You will be given a general anesthetic or spinal anesthesia.  The procedure:  Once you are asleep or numb from the waist down, your legs will be placed in stirrups.  The resectoscope will be passed through the urethra into the bladder.  Fluid will be passed through the resectoscope. This will fill the bladder with water.  The surgeon will examine the bladder through the scope. If the scope has a camera, it can take pictures from inside the bladder. They can be projected onto a TV screen.  The surgeon will use various tools to remove the tumor in small pieces. Sometimes a laser (a beam of light energy) is used. Other tools may use electric current.  A tube (catheter) will often be placed so that urine can drain into a bag outside the body. This process helps stop bleeding. This tube keeps blood clots from blocking the urethra.  The procedure usually takes 30 to 45 minutes. AFTER THE PROCEDURE   You will stay in a recovery area until the anesthesia has worn off. Your blood pressure and pulse will be checked every so often. Then you will be taken to a hospital room.  You may continue to get fluids through the IV for awhile.  Some pain is  normal. The catheter might be uncomfortable. Pain is usually not severe. If it is, ask for pain medicine.  Your urine may look bloody after a transurethral resection. This is normal.  If bleeding is heavy, a hospital caregiver may rinse out the bladder (irrigation) through the catheter.  Once the urine is clear, the catheter will be taken out.  You will need to stay in the hospital until you can urinate on your own.  Most people stay in the hospital for up to 4 days. PROGNOSIS   Transurethral resection is considered the best way to treat  bladder tumors that are not too far along. For most people, the treatment is successful. Sometimes, though, more treatment is needed.  Bladder cancers can come back even after a successful procedure. Because of this, be sure to have a checkup with your caregiver every 3 to 6 months. If everything is OK for 3 years, you can reduce the checkups to once a year.   This information is not intended to replace advice given to you by your health care provider. Make sure you discuss any questions you have with your health care provider.   Document Released: 11/24/2008 Document Revised: 04/22/2011 Document Reviewed: 01/30/2009 Elsevier Interactive Patient Education [employer].

## 2015-07-28 NOTE — Anesthesia Postprocedure Evaluation (Signed)
Anesthesia Post Note  Patient: [name]  Procedure(s) Performed: Procedure(s) (LRB): CYSTOSCOPY (N/A) TRANSURETHRAL RESECTION OF BLADDER  BIOPSY (N/A)  Patient location during evaluation: PACU Anesthesia Type: General Level of consciousness: awake and alert Pain management: pain level controlled Vital Signs Assessment: post-procedure vital signs reviewed and stable Respiratory status: spontaneous breathing, nonlabored ventilation and respiratory function stable Cardiovascular status: blood pressure returned to baseline and stable Postop Assessment: no signs of nausea or vomiting Anesthetic complications: no    Last Vitals:  Filed Vitals:   07/28/15 1045 07/28/15 1053  BP: 101/60 104/66  Pulse: 63 55  Temp: 36.6 C 36.4 C  Resp: 12 14    Last Pain: There were no vitals filed for this visit.               Craige Patel,W. EDMOND

## 2015-09-04 DIAGNOSIS — C670 Malignant neoplasm of trigone of bladder: Secondary | ICD-10-CM | POA: Diagnosis not present

## 2015-09-13 DIAGNOSIS — H353131 Nonexudative age-related macular degeneration, bilateral, early dry stage: Secondary | ICD-10-CM | POA: Diagnosis not present

## 2015-09-13 DIAGNOSIS — H26491 Other secondary cataract, right eye: Secondary | ICD-10-CM | POA: Diagnosis not present

## 2015-09-13 DIAGNOSIS — Z961 Presence of intraocular lens: Secondary | ICD-10-CM | POA: Diagnosis not present

## 2015-09-13 DIAGNOSIS — H34212 Partial retinal artery occlusion, left eye: Secondary | ICD-10-CM | POA: Diagnosis not present

## 2015-09-27 DIAGNOSIS — C670 Malignant neoplasm of trigone of bladder: Secondary | ICD-10-CM | POA: Diagnosis not present

## 2015-09-27 DIAGNOSIS — Z5111 Encounter for antineoplastic chemotherapy: Secondary | ICD-10-CM | POA: Diagnosis not present

## 2015-10-04 DIAGNOSIS — B961 Klebsiella pneumoniae [K. pneumoniae] as the cause of diseases classified elsewhere: Secondary | ICD-10-CM | POA: Diagnosis not present

## 2015-10-04 DIAGNOSIS — N390 Urinary tract infection, site not specified: Secondary | ICD-10-CM | POA: Diagnosis not present

## 2015-10-09 DIAGNOSIS — H26491 Other secondary cataract, right eye: Secondary | ICD-10-CM | POA: Diagnosis not present

## 2015-10-18 DIAGNOSIS — Z5111 Encounter for antineoplastic chemotherapy: Secondary | ICD-10-CM | POA: Diagnosis not present

## 2015-10-18 DIAGNOSIS — C672 Malignant neoplasm of lateral wall of bladder: Secondary | ICD-10-CM | POA: Diagnosis not present

## 2015-11-03 DIAGNOSIS — N390 Urinary tract infection, site not specified: Secondary | ICD-10-CM | POA: Diagnosis not present

## 2015-11-03 DIAGNOSIS — C672 Malignant neoplasm of lateral wall of bladder: Secondary | ICD-10-CM | POA: Diagnosis not present

## 2015-11-03 DIAGNOSIS — Z5111 Encounter for antineoplastic chemotherapy: Secondary | ICD-10-CM | POA: Diagnosis not present

## 2015-11-08 DIAGNOSIS — Z5111 Encounter for antineoplastic chemotherapy: Secondary | ICD-10-CM | POA: Diagnosis not present

## 2015-11-08 DIAGNOSIS — C672 Malignant neoplasm of lateral wall of bladder: Secondary | ICD-10-CM | POA: Diagnosis not present

## 2015-11-15 DIAGNOSIS — N390 Urinary tract infection, site not specified: Secondary | ICD-10-CM | POA: Diagnosis not present

## 2015-11-15 DIAGNOSIS — Z5111 Encounter for antineoplastic chemotherapy: Secondary | ICD-10-CM | POA: Diagnosis not present

## 2015-11-15 DIAGNOSIS — C672 Malignant neoplasm of lateral wall of bladder: Secondary | ICD-10-CM | POA: Diagnosis not present

## 2015-11-16 DIAGNOSIS — H34219 Partial retinal artery occlusion, unspecified eye: Secondary | ICD-10-CM | POA: Diagnosis not present

## 2015-11-16 DIAGNOSIS — M109 Gout, unspecified: Secondary | ICD-10-CM | POA: Diagnosis not present

## 2015-11-16 DIAGNOSIS — I480 Paroxysmal atrial fibrillation: Secondary | ICD-10-CM | POA: Diagnosis not present

## 2015-11-16 DIAGNOSIS — C670 Malignant neoplasm of trigone of bladder: Secondary | ICD-10-CM | POA: Diagnosis not present

## 2015-11-16 DIAGNOSIS — E784 Other hyperlipidemia: Secondary | ICD-10-CM | POA: Diagnosis not present

## 2015-11-16 DIAGNOSIS — R739 Hyperglycemia, unspecified: Secondary | ICD-10-CM | POA: Diagnosis not present

## 2015-11-16 DIAGNOSIS — I10 Essential (primary) hypertension: Secondary | ICD-10-CM | POA: Diagnosis not present

## 2015-11-17 DIAGNOSIS — L603 Nail dystrophy: Secondary | ICD-10-CM | POA: Diagnosis not present

## 2015-11-17 DIAGNOSIS — D1801 Hemangioma of skin and subcutaneous tissue: Secondary | ICD-10-CM | POA: Diagnosis not present

## 2015-11-17 DIAGNOSIS — L821 Other seborrheic keratosis: Secondary | ICD-10-CM | POA: Diagnosis not present

## 2015-11-17 DIAGNOSIS — L308 Other specified dermatitis: Secondary | ICD-10-CM | POA: Diagnosis not present

## 2015-11-22 DIAGNOSIS — C672 Malignant neoplasm of lateral wall of bladder: Secondary | ICD-10-CM | POA: Diagnosis not present

## 2015-11-22 DIAGNOSIS — Z5111 Encounter for antineoplastic chemotherapy: Secondary | ICD-10-CM | POA: Diagnosis not present

## 2015-11-29 DIAGNOSIS — C672 Malignant neoplasm of lateral wall of bladder: Secondary | ICD-10-CM | POA: Diagnosis not present

## 2015-11-29 DIAGNOSIS — Z5111 Encounter for antineoplastic chemotherapy: Secondary | ICD-10-CM | POA: Diagnosis not present

## 2015-12-08 DIAGNOSIS — R300 Dysuria: Secondary | ICD-10-CM | POA: Diagnosis not present

## 2015-12-08 DIAGNOSIS — R350 Frequency of micturition: Secondary | ICD-10-CM | POA: Diagnosis not present

## 2015-12-13 DIAGNOSIS — C672 Malignant neoplasm of lateral wall of bladder: Secondary | ICD-10-CM | POA: Diagnosis not present

## 2015-12-13 DIAGNOSIS — Z5111 Encounter for antineoplastic chemotherapy: Secondary | ICD-10-CM | POA: Diagnosis not present

## 2015-12-21 DIAGNOSIS — C670 Malignant neoplasm of trigone of bladder: Secondary | ICD-10-CM | POA: Diagnosis not present

## 2015-12-21 DIAGNOSIS — C672 Malignant neoplasm of lateral wall of bladder: Secondary | ICD-10-CM | POA: Diagnosis not present

## 2015-12-21 DIAGNOSIS — N390 Urinary tract infection, site not specified: Secondary | ICD-10-CM | POA: Diagnosis not present

## 2015-12-27 DIAGNOSIS — C672 Malignant neoplasm of lateral wall of bladder: Secondary | ICD-10-CM | POA: Diagnosis not present

## 2015-12-27 DIAGNOSIS — Z5111 Encounter for antineoplastic chemotherapy: Secondary | ICD-10-CM | POA: Diagnosis not present

## 2016-01-03 ENCOUNTER — Encounter: Payer: Self-pay | Admitting: *Deleted

## 2016-01-15 ENCOUNTER — Ambulatory Visit (INDEPENDENT_AMBULATORY_CARE_PROVIDER_SITE_OTHER): Payer: PPO | Admitting: Cardiovascular Disease

## 2016-01-15 ENCOUNTER — Encounter: Payer: Self-pay | Admitting: Cardiovascular Disease

## 2016-01-15 VITALS — BP 96/40 | HR 50 | Ht 68.0 in | Wt 184.3 lb

## 2016-01-15 DIAGNOSIS — I2510 Atherosclerotic heart disease of native coronary artery without angina pectoris: Secondary | ICD-10-CM

## 2016-01-15 DIAGNOSIS — I10 Essential (primary) hypertension: Secondary | ICD-10-CM | POA: Diagnosis not present

## 2016-01-15 DIAGNOSIS — I6523 Occlusion and stenosis of bilateral carotid arteries: Secondary | ICD-10-CM | POA: Diagnosis not present

## 2016-01-15 DIAGNOSIS — I481 Persistent atrial fibrillation: Secondary | ICD-10-CM

## 2016-01-15 DIAGNOSIS — I4819 Other persistent atrial fibrillation: Secondary | ICD-10-CM

## 2016-01-15 NOTE — Patient Instructions (Signed)
Medication Instructions:  Your physician recommends that you continue on your current medications as directed. Please refer to the Current Medication list given to you today.   Labwork: none  Testing/Procedures: none  Follow-Up: Your physician recommends that you schedule a follow-up appointment in: about 12 months.  Please call our office in about 9 months to schedule this appointment    Any Other Special Instructions Will Be Listed Below (If Applicable).     If you need a refill on your cardiac medications before your next appointment, please call your pharmacy.

## 2016-01-15 NOTE — Progress Notes (Signed)
Chief Complaint  Patient presents with  . Follow-up     History of Present Illness: 75 yo male with a past medical history significant for atrial fibrillation, hypertension, hyperlipidemia, coronary artery disease status post MI, and balloon angioplasty in 1980s, as well as peripheral vascular disease with bilateral carotid endarterectomies in 2005, who is here today for cardiac followup. He has been on Pradaxa. Carotid artery disease followed in [facility] with stable carotid dopplers January 2016. He was admitted to the GI service on 04/05/2011 with an acute upper GI bleed. He underwent upper endoscopy with Dr. Benson Norway the following morning and was found to have multiple shallow clean-based duodenal ulcers felt to be NSAID-induced. He began to note weight loss in October 2013 and was found to have a diverticular abscess requiring colectomy 04/07/12. 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 Dr. Ellene Route performed laminotomy with removal of cyst on 05/23/12. He has been diagnosed with bladder cancer in 2017 and has undergone resection of the bladder tumor in April 2017.   He is here today for follow up. He is actually feeling well. He has no complaints. He denies dizziness, chest pain or SOB. He continues to refuse screening stress tests for known CAD. He has been taking Pradaxa as directed twice per day.    Primary Care Physician: Tivis Ringer, MD  Past Medical History:  Diagnosis Date  . AAA (abdominal aortic aneurysm) ([place]) 02-25-2011   US abdomen 3.4 cm x 3 cm last US done per pt  . Anemia   . CAD (coronary artery disease)   . Cancer [facility])    Bladder cancer  . Chronic atrial fibrillation ([facility])    Dr. Angelena Form  . Difficult intubation 2005   2014 surgeries no intubation problems with- no problems since   . Duodenal ulcer   . Dysrhythmia    Atrial fibrillation-Dr. Angelena Form follows  . Gout   . Heart attack   .  Heart murmur   . Hematuria    "bladder cancer"  . Hemorrhoids   . History of blood transfusion    last -5 yrs ago "ulcer'  . History of GI bleed   . Hyperlipidemia   . Hypertension   . Myocardial infarction    history of. 1983  . Renal insufficiency     Past Surgical History:  Procedure Laterality Date  . CARDIAC CATHETERIZATION     '83  . CAROTID ENDARTERECTOMY     bilaterally 2007  . CATARACT EXTRACTION Bilateral 2-3 yrs ago  . Cath/angioplasy     [id number], [place] by Dr. Gilda Crease [facility] of angioplasty)  . COLOSTOMY N/A 04/07/2012   Procedure: COLOSTOMY;  Surgeon: Edward Jolly, MD;  Location: [facility];  Service: General;  Laterality: N/A;  Sigmoid Colectomy Colostomy   . COLOSTOMY TAKEDOWN N/A 09/30/2012   Procedure: TAKEDOWN OF HARTMAN COLOSTOMY;  Surgeon: Edward Jolly, MD;  Location: [facility];  Service: General;  Laterality: N/A;  . CYSTOSCOPY     done in Office -Dr. Gaynelle Arabian 05-15-15  . CYSTOSCOPY N/A 07/28/2015   Procedure: CYSTOSCOPY;  Surgeon: [name], MD;  Location: [facility];  Service: Urology;  Laterality: N/A;  . CYSTOSCOPY W/ URETERAL STENT PLACEMENT Right 05/26/2015   Procedure: CYSTOSCOPY WITH LEFT RETROGRADE PYELOGRAM/URETERAL ;  Surgeon: [name], MD;  Location: [facility];  Service: Urology;  Laterality: Right;  . ESOPHAGOGASTRODUODENOSCOPY  04/06/2011   Procedure: ESOPHAGOGASTRODUODENOSCOPY (EGD);  Surgeon: Saralyn Pilar  Renee Ramus, MD;  Location: [facility];  Service: Endoscopy;  Laterality: N/A;  . EYE SURGERY  2011   bilateral  . LAMINECTOMY N/A 05/23/2012   Procedure: LUMBAR four-five LAMINECTOMY ;  Surgeon: Kristeen Miss, MD;  Location: [facility];  Service: Neurosurgery;  Laterality: N/A;  . No colonoscopy to date     ("I don't have a good excuse"). SOC reviewed  . PARTIAL COLECTOMY N/A 04/07/2012   Procedure: PARTIAL COLECTOMY;  Surgeon: Edward Jolly, MD;  Location: [facility];  Service: General;  Laterality: N/A;  Sigmoid Colectomy  Colostomy hartman procedure  . pyloric stenosis    . TONSILLECTOMY  as child  . tooth extraction  09/24/16   and dental implant  . TRANSURETHRAL RESECTION OF BLADDER TUMOR N/A 05/26/2015   Procedure: TRANSURETHRAL RESECTION OF BLADDER TUMOR (TURBT);  Surgeon: [name], MD;  Location: [facility];  Service: Urology;  Laterality: N/A;  . TRANSURETHRAL RESECTION OF BLADDER TUMOR N/A 07/28/2015   Procedure: TRANSURETHRAL RESECTION OF BLADDER  BIOPSY;  Surgeon: [name], MD;  Location: [facility];  Service: Urology;  Laterality: N/A;    Current Outpatient Prescriptions  Medication Sig Dispense Refill  . amLODipine (NORVASC) 5 MG tablet Take 5 mg by mouth every morning.     . dabigatran (PRADAXA) 150 MG CAPS capsule TAKE 1 CAPSULE (150 MG TOTAL) BY MOUTH EVERY 12 (TWELVE) HOURS. May restart tomorrow. 60 capsule 9  . dutasteride (AVODART) 0.5 MG capsule Take 1 capsule (0.5 mg total) by mouth daily. 90 capsule 3  . Meth-Hyo-M Bl-Na Phos-Ph Sal (URIBEL) 118 MG CAPS Take 1 capsule (118 mg total) by mouth 3 times/day as needed-between meals & bedtime (medication can slow down elective urination). NB: Will turn urine blue-green NB: may stain clothing 120 capsule 3  . metoprolol succinate (TOPROL-XL) 100 MG 24 hr tablet Take 100 mg by mouth daily. Take with or immediately following a meal.    . Multiple Vitamins-Minerals (ICAPS) TABS Take 1 tablet by mouth 2 (two) times daily.     Marland Kitchen omeprazole (PRILOSEC) 40 MG capsule Take 40 mg by mouth every evening.     . rosuvastatin (CRESTOR) 20 MG tablet Take 20 mg by mouth 2 (two) times a week. Take on Monday and Friday.    . tamsulosin (FLOMAX) 0.4 MG CAPS capsule Take 1 capsule (0.4 mg total) by mouth daily. 90 capsule 3  . traMADol-acetaminophen (ULTRACET) 37.5-325 MG tablet Take 1 tablet by mouth every 6 (six) hours as needed. (Patient taking differently: Take 1 tablet by mouth every 6 (six) hours as needed (For pain.). ) 30 tablet 3  .  traMADol-acetaminophen (ULTRACET) 37.5-325 MG tablet Take 1 tablet by mouth every 6 (six) hours as needed for moderate pain. 30 tablet 0  . trimethoprim (TRIMPEX) 100 MG tablet Take 1 tablet (100 mg total) by mouth 1 day or 1 dose. (Patient taking differently: Take 100 mg by mouth daily. ) 30 tablet 1   No current facility-administered medications for this visit.    Facility-Administered Medications Ordered in Other Visits  Medication Dose Route Frequency Provider Last Rate Last Dose  . polymixin-bacitracin (POLYSPORIN) ointment   Topical BID [name], MD        Allergies  Allergen Reactions  . Eggs Or Egg-Derived Products Nausea And Vomiting    Social History   Social History  . Marital status: Married    Spouse name: N/A  . Number of children: 3  . Years of education: N/A  Occupational History  . Stockbroker    Social History Main Topics  . Smoking status: Former Smoker    Packs/day: 3.00    Years: 25.00    Types: Cigarettes    Quit date: 02/11/1981  . Smokeless tobacco: Never Used  . Alcohol use 12.6 oz/week    21 Shots of liquor per week     Comment: daily 2-3 per day  . Drug use: No  . Sexual activity: Not Currently   Other Topics Concern  . Not on file   Social History Narrative  . No narrative on file    Family History  Problem Relation Age of Onset  . Heart attack Father   . Heart disease Father     After age 41  . Cancer Father   . Cancer Mother   . COPD Mother   . Emphysema Mother   . Obesity Sister   . Alcohol abuse Brother   . Colon cancer Neg Hx     Review of Systems:  As stated in the HPI and otherwise negative.   BP (!) 96/40   Pulse (!) 50   Ht 5\' 8"  (1.727 m)   Wt 184 lb 5 oz (83.6 kg)   BMI 28.02 kg/m   Physical Examination: General: Well developed, well nourished, NAD  HEENT: OP clear, mucus membranes moist  SKIN: warm, dry. No rashes. Neuro: No focal deficits  Musculoskeletal: Muscle strength 5/5 all ext    Psychiatric: Mood and affect normal  Neck: No JVD, no carotid bruits, no thyromegaly, no lymphadenopathy.  Lungs:Clear bilaterally, no wheezes, rhonci, crackles Cardiovascular: Irregular, irregular. No murmurs, gallops or rubs. Abdomen:Soft. Bowel sounds present. Non-tender.  Extremities: No lower extremity edema. Pulses are 2 + in the bilateral DP/PT.  EKG:  EKG is not ordered today. The ekg ordered today demonstrates   Recent Labs: 07/24/2015: BUN 21; Creatinine, Ser 2.01; Hemoglobin 11.9; Platelets 89; Potassium 4.6; Sodium 138     Wt Readings from Last 3 Encounters:  01/15/16 184 lb 5 oz (83.6 kg)  07/28/15 191 lb (86.6 kg)  07/24/15 191 lb (86.6 kg)     Other studies Reviewed: Additional studies/ records that were reviewed today include: . Review of the above records demonstrates:    Assessment and Plan:   1. CAD without angina: No chest pain suggestive of angina. Continue beta blocker, statin. He is not on ASA since he is on Pradaxa and had prior GI bleeding due to ulcers. He refuses surveillance stress testing or repeat echo.   2. ATRIAL FIBRILLATION, persistent: He has chronic, persistent atrial fibrillation. Will continue Toprol for rate control and Pradaxa for anti-coagulation.   3. Carotid artery disease:  Bilateral carotid endarterectomy in 2007. Followed in [facility]. He was last seen there in January 2016. He needs to call and arrange f/u.   4. HTN: BP is controlled. No changes.  Current medicines are reviewed at length with the patient today.  The patient does not have concerns regarding medicines.  The following changes have been made:  no change  Labs/ tests ordered today include:   No orders of the defined types were placed in this encounter.   Disposition:   FU with me in 12  months  Signed, Lauree Chandler, MD 01/15/2016 9:23 AM    [address], [place], [place]  [address] Phone: [phone]; Fax: [phone]

## 2016-01-31 DIAGNOSIS — C672 Malignant neoplasm of lateral wall of bladder: Secondary | ICD-10-CM | POA: Diagnosis not present

## 2016-01-31 DIAGNOSIS — Z5111 Encounter for antineoplastic chemotherapy: Secondary | ICD-10-CM | POA: Diagnosis not present

## 2016-02-27 DIAGNOSIS — C670 Malignant neoplasm of trigone of bladder: Secondary | ICD-10-CM | POA: Diagnosis not present

## 2016-02-27 DIAGNOSIS — R310 Gross hematuria: Secondary | ICD-10-CM | POA: Diagnosis not present

## 2016-03-05 DIAGNOSIS — N401 Enlarged prostate with lower urinary tract symptoms: Secondary | ICD-10-CM | POA: Diagnosis not present

## 2016-03-05 DIAGNOSIS — C670 Malignant neoplasm of trigone of bladder: Secondary | ICD-10-CM | POA: Diagnosis not present

## 2016-03-05 DIAGNOSIS — R3912 Poor urinary stream: Secondary | ICD-10-CM | POA: Diagnosis not present

## 2016-03-05 DIAGNOSIS — R310 Gross hematuria: Secondary | ICD-10-CM | POA: Diagnosis not present

## 2016-03-05 DIAGNOSIS — R3911 Hesitancy of micturition: Secondary | ICD-10-CM | POA: Diagnosis not present

## 2016-03-05 DIAGNOSIS — R350 Frequency of micturition: Secondary | ICD-10-CM | POA: Diagnosis not present

## 2016-03-06 DIAGNOSIS — R739 Hyperglycemia, unspecified: Secondary | ICD-10-CM | POA: Diagnosis not present

## 2016-03-06 DIAGNOSIS — Z6826 Body mass index (BMI) 26.0-26.9, adult: Secondary | ICD-10-CM | POA: Diagnosis not present

## 2016-03-06 DIAGNOSIS — I10 Essential (primary) hypertension: Secondary | ICD-10-CM | POA: Diagnosis not present

## 2016-03-06 DIAGNOSIS — N183 Chronic kidney disease, stage 3 (moderate): Secondary | ICD-10-CM | POA: Diagnosis not present

## 2016-03-06 DIAGNOSIS — Z01818 Encounter for other preprocedural examination: Secondary | ICD-10-CM | POA: Diagnosis not present

## 2016-03-06 DIAGNOSIS — C670 Malignant neoplasm of trigone of bladder: Secondary | ICD-10-CM | POA: Diagnosis not present

## 2016-03-08 ENCOUNTER — Telehealth: Payer: Self-pay | Admitting: Cardiovascular Disease

## 2016-03-08 ENCOUNTER — Other Ambulatory Visit: Payer: Self-pay | Admitting: Urology

## 2016-03-08 NOTE — Progress Notes (Signed)
Scheduling pre op- please place SURGICAL ORDERS IN EPIC  Thanks 

## 2016-03-08 NOTE — Telephone Encounter (Signed)
OK to hold Pradaxa 2 days before his planned surgical procedure.   Lauree Chandler

## 2016-03-08 NOTE — Progress Notes (Signed)
  REQUESTED ORDERS WITH [name] AND LEFT MESSAGE PATIENT DID NOT WANT SURGEON'S OFFICE NUMBER TO CALL ABOUT WHEN TO STOP PRADAXA

## 2016-03-08 NOTE — Telephone Encounter (Signed)
I spoke with Pam and gave her information from Dr. Angelena Form.  Will fax this phone note to [phone]

## 2016-03-08 NOTE — Progress Notes (Signed)
LOV CARDIO DR Weston 01-15-16 EP[IC EKG 05-17-15 EPIC CAROTID US 02-15-14 EPIC

## 2016-03-08 NOTE — Patient Instructions (Addendum)
[name]  03/08/2016   Your procedure is scheduled on: 03-12-16  Report to [facility]  Entrance take [facility]  elevators to 3rd floor to  [place] at 530  AM.  Call this number if you have problems the morning of surgery [phone]   Remember: ONLY 1 PERSON MAY GO WITH YOU TO SHORT STAY TO GET  READY MORNING OF [place].  Do not eat food or drink liquids :After Midnight.     Take these medicines the morning of surgery with A SIP OF WATER: ALLOPURINOL(ZYLOPRIM), AMLODIPINE (NORVASC), METOPROLOL SUCCINATE(TOPROL-XL)                               You may not have any metal on your body including hair pins and              piercings  Do not wear jewelry, make-up, lotions, powders or perfumes, deodorant             Do not wear nail polish.  Do not shave  48 hours prior to surgery.              Men may shave face and neck.   Do not bring valuables to the hospital. [place].  Contacts, dentures or bridgework may not be worn into surgery.  Leave suitcase in the car. After surgery it may be brought to your room.                  Please read over the following fact sheets you were given: _____________________________________________________________________             [facility] - Preparing for Surgery Before surgery, you can play an important role.  Because skin is not sterile, your skin needs to be as free of germs as possible.  You can reduce the number of germs on your skin by washing with CHG (chlorahexidine gluconate) soap before surgery.  CHG is an antiseptic cleaner which kills germs and bonds with the skin to continue killing germs even after washing. Please DO NOT use if you have an allergy to CHG or antibacterial soaps.  If your skin becomes reddened/irritated stop using the CHG and inform your nurse when you arrive at Short Stay. Do not shave (including legs and underarms) for at  least 48 hours prior to the first CHG shower.  You may shave your face/neck. Please follow these instructions carefully:  1.  Shower with CHG Soap the night before surgery and the  morning of Surgery.  2.  If you choose to wash your hair, wash your hair first as usual with your  normal  shampoo.  3.  After you shampoo, rinse your hair and body thoroughly to remove the  shampoo.                           4.  Use CHG as you would any other liquid soap.  You can apply chg directly  to the skin and wash                       Gently with a scrungie or clean washcloth.  5.  Apply the CHG Soap to your body ONLY FROM THE NECK DOWN.   Do not use on face/ open                           Wound or open sores. Avoid contact with eyes, ears mouth and genitals (private parts).                       Wash face,  Genitals (private parts) with your normal soap.             6.  Wash thoroughly, paying special attention to the area where your surgery  will be performed.  7.  Thoroughly rinse your body with warm water from the neck down.  8.  DO NOT shower/wash with your normal soap after using and rinsing off  the CHG Soap.                9.  Pat yourself dry with a clean towel.            10.  Wear clean pajamas.            11.  Place clean sheets on your bed the night of your first shower and do not  sleep with pets. Day of Surgery : Do not apply any lotions/deodorants the morning of surgery.  Please wear clean clothes to the hospital/surgery center.  FAILURE TO FOLLOW THESE INSTRUCTIONS MAY RESULT IN THE CANCELLATION OF YOUR SURGERY PATIENT SIGNATURE_________________________________  NURSE SIGNATURE__________________________________  ________________________________________________________________________

## 2016-03-08 NOTE — Telephone Encounter (Signed)
Request for surgical clearance:  1. What type of surgery is being performed? TURP   2. When is this surgery scheduled? 03-12-16-need this back today asap please   3. Are there any medications that need to be held prior to surgery and how long Can he hold his Pradaxa? If so,how long?  4. Name of physician performing surgery? Dr [name]   5. What is your office phone and fax number? [phone] and fax is [phone]

## 2016-03-11 ENCOUNTER — Encounter (HOSPITAL_COMMUNITY)
Admission: RE | Admit: 2016-03-11 | Discharge: 2016-03-11 | Disposition: A | Discharge status: Home / Self Care | Payer: PPO | Source: Ambulatory Visit | Attending: Urology | Admitting: Urology

## 2016-03-11 ENCOUNTER — Encounter (HOSPITAL_COMMUNITY): Payer: Self-pay

## 2016-03-11 DIAGNOSIS — Z87891 Personal history of nicotine dependence: Secondary | ICD-10-CM | POA: Diagnosis not present

## 2016-03-11 DIAGNOSIS — N401 Enlarged prostate with lower urinary tract symptoms: Secondary | ICD-10-CM | POA: Diagnosis not present

## 2016-03-11 DIAGNOSIS — R351 Nocturia: Secondary | ICD-10-CM | POA: Diagnosis not present

## 2016-03-11 DIAGNOSIS — R3911 Hesitancy of micturition: Secondary | ICD-10-CM | POA: Diagnosis not present

## 2016-03-11 DIAGNOSIS — I2510 Atherosclerotic heart disease of native coronary artery without angina pectoris: Secondary | ICD-10-CM | POA: Diagnosis not present

## 2016-03-11 DIAGNOSIS — R3912 Poor urinary stream: Secondary | ICD-10-CM | POA: Diagnosis not present

## 2016-03-11 DIAGNOSIS — Z7901 Long term (current) use of anticoagulants: Secondary | ICD-10-CM | POA: Diagnosis not present

## 2016-03-11 DIAGNOSIS — Z79899 Other long term (current) drug therapy: Secondary | ICD-10-CM | POA: Diagnosis not present

## 2016-03-11 DIAGNOSIS — R350 Frequency of micturition: Secondary | ICD-10-CM | POA: Diagnosis not present

## 2016-03-11 DIAGNOSIS — I10 Essential (primary) hypertension: Secondary | ICD-10-CM | POA: Diagnosis not present

## 2016-03-11 DIAGNOSIS — I252 Old myocardial infarction: Secondary | ICD-10-CM | POA: Diagnosis not present

## 2016-03-11 DIAGNOSIS — R3915 Urgency of urination: Secondary | ICD-10-CM | POA: Diagnosis not present

## 2016-03-11 DIAGNOSIS — R3914 Feeling of incomplete bladder emptying: Secondary | ICD-10-CM | POA: Diagnosis not present

## 2016-03-11 DIAGNOSIS — R3916 Straining to void: Secondary | ICD-10-CM | POA: Diagnosis not present

## 2016-03-11 DIAGNOSIS — I4891 Unspecified atrial fibrillation: Secondary | ICD-10-CM | POA: Diagnosis not present

## 2016-03-11 DIAGNOSIS — C670 Malignant neoplasm of trigone of bladder: Secondary | ICD-10-CM | POA: Diagnosis not present

## 2016-03-11 HISTORY — DX: Nonexudative age-related macular degeneration, unspecified eye, stage unspecified: H35.3190

## 2016-03-11 LAB — BASIC METABOLIC PANEL
Anion gap: 9 (ref 5–15)
BUN: 21 mg/dL — ABNORMAL HIGH (ref 6–20)
CHLORIDE: 99 mmol/L — AB (ref 101–111)
CO2: 28 mmol/L (ref 22–32)
CREATININE: 1.78 mg/dL — AB (ref 0.61–1.24)
Calcium: 9.3 mg/dL (ref 8.9–10.3)
GFR calc non Af Amer: 35 mL/min — ABNORMAL LOW (ref >60)
GFR, EST AFRICAN AMERICAN: 41 mL/min — AB (ref >60)
Glucose, Bld: 107 mg/dL — ABNORMAL HIGH (ref 65–99)
Potassium: 5 mmol/L (ref 3.5–5.1)
SODIUM: 136 mmol/L (ref 135–145)

## 2016-03-11 LAB — CBC
HCT: 37.8 % — ABNORMAL LOW (ref 39.0–52.0)
HEMOGLOBIN: 12.5 g/dL — AB (ref 13.0–17.0)
MCH: 33.7 pg (ref 26.0–34.0)
MCHC: 33.1 g/dL (ref 30.0–36.0)
MCV: 101.9 fL — ABNORMAL HIGH (ref 78.0–100.0)
Platelets: 155 10*3/uL (ref 150–400)
RBC: 3.71 MIL/uL — AB (ref 4.22–5.81)
RDW: 15.8 % — ABNORMAL HIGH (ref 11.5–15.5)
WBC: 7.8 10*3/uL (ref 4.0–10.5)

## 2016-03-11 NOTE — Progress Notes (Addendum)
Requested cardiac clearance from Dr Gaynelle Arabian, [facility] --- received copy of phone call that includes instructions from Dr Lauree Chandler for patient to hold Pradaxa prior to planned surgical procedure.   Requested most recent EKG and CXR from office visit 03-06-16 from [place]   --CXR 03-06-16 on chart EKG 03-06-16 on chart

## 2016-03-11 NOTE — Progress Notes (Signed)
HGA1C 03-06-16 on chart Medical clearance Dr Dagmar Hait 03-06-16

## 2016-03-11 NOTE — H&P (Signed)
Office Visit Report     03/05/2016   --------------------------------------------------------------------------------   Martin Saucier. Mcdonald  MRN: [id number]  PRIMARY CARE:  Martin Morita, MD  DOB: 03/31/1940, 76 year old Male  REFERRING:  Martin Edgington I. Gaynelle Arabian, MD  SSN: [id number]  PROVIDER:  [name], M.D.    LOCATION:  [facility] [phone]   --------------------------------------------------------------------------------   CC: I have bladder cancer.  HPI: [name] is a 76 year-old male established patient who is here for bladder cancer.  His problem was diagnosed 05/26/2015. His bladder cancer was diagnosed by [facility]. His cancer was diagnosed at [facility]. The bladder cancer was found because of blood in his urine.   His bladder cancer was treated by removal with scope and chemotherapy. Patient denies removal of the entire bladder and radiation.   His last cysto was 07/28/2015.   He does have a good appetite. BOWEL HABITS: his bowels are moving normally. He is not having pain in new locations. He has not recently had unwanted weight loss.   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     CC: BPH  HPI: The patient complains of lower urinary tract symptom(s) that include frequency, urgency, hesitancy, weak stream, intermittency, straining, nocturia, sense of incomplete emptying, and hematuria. The patient states his most bothersome symptom(s) are the following: urgency. Patient is currently treated with tamsulosin for his symptoms. His symptoms have been worse over the last year. The patient states if he were to spend the rest of his life with his current urinary condition, he would be mostly disappointed. He complains of  other associated symptom(s). He has previously tried dutasteride/tamsulosin for his symptoms.   Failing combination dutasteride/tamsulosin Rx. Now gross hematuria.      AUA Symptom Score: Less than 20% of the time he has the sensation of not emptying his bladder completely when finished urinating. Less than 50% of the time he has to urinate again fewer than two hours after he has finished urinating. Less than 20% of the time he has to start and stop again several times when he urinates. Less than 20% of the time he finds it difficult to postpone urination. 50% of the time he has a weak urinary stream. 50% of the time he has to push or strain to begin urination. He has to get up to urinate 2 times from the time he goes to bed until the time he gets up in the morning.   Calculated AUA Symptom Score: 13    IIEF-5 Score: The patient's confidence that he can get an erection is very low. The patient's erections were hard enough for penetration almost never or never.     ALLERGIES: No Allergies    MEDICATIONS: Dutasteride 0.5 mg capsule TAKE 1 CAPSULE EVERY DAY  Allopurinol TABS Oral  AmLODIPine Besylate 5 MG Oral Tablet Oral  Crestor 20 MG Oral Tablet Oral  DiazePAM 10 MG Oral Tablet Oral  Metoprolol Tartrate 100 MG Oral Tablet Oral  Pradaxa 150 MG Oral Capsule Oral     GU PSH: Bladder Instill AntiCA Agent - 01/31/2016, 12/27/2015, 12/13/2015, 11/29/2015, 11/22/2015, 11/15/2015, 11/08/2015, 11/03/2015, 10/18/2015, 09/27/2015 Cysto Bladder Ureth Biopsy - 07/28/2015 Cystoscopy TURBT >5 cm - 06/06/2015      PSH Notes: Gastric Surgery,  Colostomy Closure, Back Surgery   NON-GU PSH: Colostomy - 2015 Explore Carotid Artery - 2015    GU PMH: Bladder Cancer Trigone - 09/04/2015, Malignant neoplasm of trigone of urinary bladder, - 06/05/2015 Bladder Cancer Lateral, Malignant neoplasm of lateral wall of urinary bladder - 06/05/2015 Bladder Cancer, Unspec, Primary transitional cell carcinoma of bladder -  06/05/2015 Gross hematuria, Gross hematuria - 05/15/2015 Other microscopic hematuria, Microscopic hematuria - 05/15/2015      PMH Notes:  2015-05-02 09:05:11 - Note: Lesions Penile Shaft   NON-GU PMH: Encounter for general adult medical examination without abnormal findings, Encounter for preventive health examination - 06/05/2015 Myocardial Infarction, History of acute myocardial infarction - 2015 Personal history of other diseases of the circulatory system, History of cardiac disorder - 2015, History of atrial fibrillation, - 2015, History of hypertension, - 2015 Personal history of other diseases of the digestive system, History of gastric ulcer - 2015    FAMILY HISTORY: malignant neoplasm - Runs In Family   SOCIAL HISTORY: Marital Status: Married Current Smoking Status: Patient does not smoke anymore.     REVIEW OF SYSTEMS:    GU Review Male:   Patient denies frequent urination, hard to postpone urination, burning/ pain with urination, get up at night to urinate, leakage of urine, stream starts and stops, trouble starting your stream, have to strain to urinate , erection problems, and penile pain.  Gastrointestinal (Upper):   Patient denies nausea, vomiting, and indigestion/ heartburn.  Gastrointestinal (Lower):   Patient reports constipation. Patient denies diarrhea.  Constitutional:   Patient reports fatigue. Patient denies fever, night sweats, and weight loss.  Skin:   Patient reports itching. Patient denies skin rash/ lesion.  Eyes:   Patient denies blurred vision and double vision.  Ears/ Nose/ Throat:   Patient denies sore throat and sinus problems.  Hematologic/Lymphatic:   Patient denies swollen glands and easy bruising.  Cardiovascular:   Patient denies chest pains and leg swelling.  Respiratory:   Patient denies cough and shortness of breath.  Endocrine:   Patient denies excessive thirst.  Musculoskeletal:   Patient denies back pain and joint pain.  Neurological:   Patient  denies headaches and dizziness.  Psychologic:   Patient denies depression and anxiety.   VITAL SIGNS:      03/05/2016 11:58 AM  Height 68 in / 172.72 cm  BP 135/75 mmHg  Pulse 71 /min  Temperature 97.0 F / 36 C   GU PHYSICAL EXAMINATION:    Anus and Perineum: No hemorrhoids. No anal stenosis. No rectal fissure, no anal fissure. No edema, no dimple, no perineal tenderness, no anal tenderness.  Scrotum: No lesions. No edema. No cysts. No warts.  Epididymides: Right: no spermatocele, no masses, no cysts, no tenderness, no induration, no enlargement. Left: no spermatocele, no masses, no cysts, no tenderness, no induration, no enlargement.  Testes: No tenderness, no swelling, no enlargement left testes. No tenderness, no swelling, no enlargement right testes. Normal location left testes. Normal location right testes. No mass, no cyst, no varicocele, no hydrocele left testes. No mass, no cyst, no varicocele, no hydrocele right testes.  Urethral Meatus: Normal size. No lesion, no wart, no discharge, no polyp. Normal location.  Penis: Circumcised, no warts, no cracks. No dorsal Peyronie's plaques, no left corporal Peyronie's plaques, no right corporal Peyronie's plaques, no scarring, no warts. No balanitis, no meatal stenosis.  Prostate: 40 gram or 2+ size. Left lobe normal consistency, right lobe normal consistency. Symmetrical lobes. No prostate nodule. Left  lobe no tenderness, right lobe no tenderness.  Seminal Vesicles: Nonpalpable.  Sphincter Tone: Normal sphincter. No rectal tenderness. No rectal mass.    MULTI-SYSTEM PHYSICAL EXAMINATION:    Constitutional: Well-nourished. No physical deformities. Normally developed. Good grooming.  Neck: Neck symmetrical, not swollen. Normal tracheal position.  Respiratory: No labored breathing, no use of accessory muscles.   Cardiovascular: Normal temperature, normal extremity pulses, no swelling, no varicosities.  Lymphatic: No enlargement of neck,  axillae, groin.  Skin: No paleness, no jaundice, no cyanosis. No lesion, no ulcer, no rash.  Neurologic / Psychiatric: Oriented to time, oriented to place, oriented to person. No depression, no anxiety, no agitation.  Gastrointestinal: No mass, no tenderness, no rigidity, non obese abdomen.  Eyes: Normal conjunctivae. Normal eyelids.  Ears, Nose, Mouth, and Throat: Left ear no scars, no lesions, no masses. Right ear no scars, no lesions, no masses. Nose no scars, no lesions, no masses. Normal hearing. Normal lips.  Musculoskeletal: Normal gait and station of head and neck.     PAST DATA REVIEWED:  Source Of History:  Patient, Family/Caregiver, Outside Source  Lab Test Review:   CBC with Diff, BMP  Records Review:   Previous Patient Records   PROCEDURES:         Flexible Cystoscopy - 52000  Risks, benefits, and some of the potential complications of the procedure were discussed at length with the patient including infection, bleeding, voiding discomfort, urinary retention, fever, chills, sepsis, and others. All questions were answered. Informed consent was obtained. Antibiotic prophylaxis was given. Sterile technique and intraurethral analgesia were used.  Meatus:  Normal size. Normal location. Normal condition.  Urethra:  No strictures.  External Sphincter:  Normal.  Verumontanum:  Normal.  Prostate:  Non-obstructing. No hyperplasia.  Bladder Neck:  Non-obstructing.  Ureteral Orifices:  Normal location. Normal size. Normal shape. Effluxed clear urine.  Bladder:  Moderate trabeculation. ++ cellules; Erythematous mucosa with clot. No definite tumors ( ? BCG reaction vs tumor). No stones.      The lower urinary tract was carefully examined. The procedure was well-tolerated and without complications. Antibiotic instructions were given. Instructions were given to call the office immediately for bloody urine, difficulty urinating, urinary retention, painful or frequent urination, fever, chills,  nausea, vomiting or other illness. The patient stated that he understood these instructions and would comply with them.         Urinalysis w/Scope Dipstick Dipstick Cont'd Micro  Color: Red Bilirubin: Neg WBC/hpf: 0 - 5/hpf  Appearance: Cloudy Ketones: Trace RBC/hpf: >60/hpf  Specific Gravity: 1.025 Blood: 3+ Bacteria: Rare (0-9/hpf)  pH: 6.0 Protein: 2+ Cystals: NS (Not Seen)  Glucose: Neg Urobilinogen: 1.0 Casts: NS (Not Seen)    Nitrites: Neg Trichomonas: Not Present    Leukocyte Esterase: 1+ Mucous: Not Present      Epithelial Cells: 0 - 5/hpf      Yeast: NS (Not Seen)      Sperm: Not Present    Notes: microscopic performed on unconcentrated urine    ASSESSMENT:      ICD-10 Details  1 GU:   Bladder Cancer Trigone - C67.0   2   BPH w/LUTS - N40.1   3   Gross hematuria - R31.0   4   Urinary Frequency - R35.0   5   Urinary Hesitancy - R39.11   6   Weak Urinary Stream - R39.12           Notes:   Mr. 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 His second look procedure showed superficial TCC high grade only, on biopsies of the trigone. He is post Rx with BCG therapy, but has not tolerated the therapy, despite decreasing the concentration by 50%.  Cystoscopy today shows significant BPH, with blood in the prostatic fossa, and obstruction of the bladder outlet, despite combination dutasteride/tamsulosin Rx. He may have underlying TCC, but I cannot see it on office cystoscopy. Urine cytology is negative. I have spoken to Dr. Dagmar Hait today. He has 2 normal Hg A1c in the last 6 months, and may have a random glucose of 122 secondary to lunch or lab error. Dr. Dagmar Hait will see him in anticipation of upcoming TURP/TUR-BT. Note his decreased renal function in the last year-thought 2ndary to his prostatic obstruction. He will have thulium laser prostatectomy, and possible TUR-BT, as soon as possible.     PLAN:            Medications Stop Meds:  Tamsulosin HCl - 0.4 MG Oral Capsule Oral  Start: 06/05/2015  Discontinue: 03/05/2016  - Reason: The medication cycle was completed.  Tolterodine Tartrate Er 2 mg capsule, ext release 24 hr 1 capsule PO Daily  Start: 11/10/2015  Stop: 05/08/2016   Discontinue: 03/05/2016  - Reason: The medication cycle was completed.  Tramadol-Acetaminophen 37.5-325 MG Oral Tablet Oral  Start: 06/05/2015  Discontinue: 03/05/2016  - Reason: The medication cycle was completed.  Trimethoprim 100 MG Oral Tablet Oral  Start: 06/05/2015  Discontinue: 03/05/2016  - Reason: The medication cycle was completed.  Uribel 118 MG Oral Capsule Oral  Start: 06/05/2015  Discontinue: 03/05/2016  - Reason: The medication cycle was completed.            Document Letter(s):  Created for Patient: Clinical Summary         Notes:   cc: Dr. Berneta Sages    Signed by [name], M.D. on 03/05/16 at 2:49 PM (EST)     The information contained in this medical record document is considered private and confidential patient information. This information can only be used for the medical diagnosis and/or medical services that are being provided by the patient's selected caregivers. This information can only be distributed outside of the patient's care if the patient agrees and signs waivers of authorization for this information to be sent to an outside source or route.

## 2016-03-11 NOTE — Progress Notes (Signed)
BMP results routed via epic to Dr Gaynelle Arabian

## 2016-03-12 ENCOUNTER — Ambulatory Visit (HOSPITAL_COMMUNITY): Payer: PPO | Admitting: Anesthesiology

## 2016-03-12 ENCOUNTER — Encounter (HOSPITAL_COMMUNITY): Payer: Self-pay | Admitting: Urology

## 2016-03-12 ENCOUNTER — Ambulatory Visit (HOSPITAL_COMMUNITY)
Admission: RE | Admit: 2016-03-12 | Discharge: 2016-03-12 | Disposition: A | Discharge status: Home / Self Care | Payer: PPO | Source: Ambulatory Visit | Attending: Urology | Admitting: Urology

## 2016-03-12 ENCOUNTER — Encounter (HOSPITAL_COMMUNITY)
Admission: RE | Disposition: A | Discharge status: Home / Self Care | Payer: Self-pay | Source: Ambulatory Visit | Attending: Urology

## 2016-03-12 DIAGNOSIS — R339 Retention of urine, unspecified: Secondary | ICD-10-CM | POA: Diagnosis not present

## 2016-03-12 DIAGNOSIS — I4891 Unspecified atrial fibrillation: Secondary | ICD-10-CM | POA: Insufficient documentation

## 2016-03-12 DIAGNOSIS — N400 Enlarged prostate without lower urinary tract symptoms: Secondary | ICD-10-CM | POA: Diagnosis not present

## 2016-03-12 DIAGNOSIS — Z87891 Personal history of nicotine dependence: Secondary | ICD-10-CM | POA: Insufficient documentation

## 2016-03-12 DIAGNOSIS — I252 Old myocardial infarction: Secondary | ICD-10-CM | POA: Diagnosis not present

## 2016-03-12 DIAGNOSIS — R3916 Straining to void: Secondary | ICD-10-CM | POA: Insufficient documentation

## 2016-03-12 DIAGNOSIS — R3915 Urgency of urination: Secondary | ICD-10-CM | POA: Diagnosis not present

## 2016-03-12 DIAGNOSIS — I10 Essential (primary) hypertension: Secondary | ICD-10-CM | POA: Insufficient documentation

## 2016-03-12 DIAGNOSIS — N401 Enlarged prostate with lower urinary tract symptoms: Secondary | ICD-10-CM | POA: Insufficient documentation

## 2016-03-12 DIAGNOSIS — R350 Frequency of micturition: Secondary | ICD-10-CM | POA: Diagnosis not present

## 2016-03-12 DIAGNOSIS — Z79899 Other long term (current) drug therapy: Secondary | ICD-10-CM | POA: Diagnosis not present

## 2016-03-12 DIAGNOSIS — R3914 Feeling of incomplete bladder emptying: Secondary | ICD-10-CM | POA: Diagnosis not present

## 2016-03-12 DIAGNOSIS — I2510 Atherosclerotic heart disease of native coronary artery without angina pectoris: Secondary | ICD-10-CM | POA: Insufficient documentation

## 2016-03-12 DIAGNOSIS — R351 Nocturia: Secondary | ICD-10-CM | POA: Diagnosis not present

## 2016-03-12 DIAGNOSIS — N138 Other obstructive and reflux uropathy: Secondary | ICD-10-CM

## 2016-03-12 DIAGNOSIS — C670 Malignant neoplasm of trigone of bladder: Secondary | ICD-10-CM | POA: Diagnosis not present

## 2016-03-12 DIAGNOSIS — R3912 Poor urinary stream: Secondary | ICD-10-CM | POA: Insufficient documentation

## 2016-03-12 DIAGNOSIS — Z7901 Long term (current) use of anticoagulants: Secondary | ICD-10-CM | POA: Diagnosis not present

## 2016-03-12 DIAGNOSIS — R3911 Hesitancy of micturition: Secondary | ICD-10-CM | POA: Diagnosis not present

## 2016-03-12 DIAGNOSIS — C679 Malignant neoplasm of bladder, unspecified: Secondary | ICD-10-CM | POA: Diagnosis not present

## 2016-03-12 HISTORY — PX: TRANSURETHRAL RESECTION OF BLADDER TUMOR: SHX2575

## 2016-03-12 LAB — APTT: APTT: 30 s (ref 24–36)

## 2016-03-12 SURGERY — THULIUM LASER TURP (TRANSURETHRAL RESECTION OF PROSTATE)
Anesthesia: General | Site: Bladder

## 2016-03-12 MED ORDER — TRAMADOL-ACETAMINOPHEN 37.5-325 MG PO TABS
1.0000 | ORAL_TABLET | Freq: Four times a day (QID) | ORAL | Status: DC | PRN
  Filled 2016-03-12: qty 1

## 2016-03-12 MED ORDER — FENTANYL CITRATE (PF) 100 MCG/2ML IJ SOLN
INTRAMUSCULAR | Status: DC | PRN
  Administered 2016-03-12: 25 ug via INTRAVENOUS
  Administered 2016-03-12 (×3): 50 ug via INTRAVENOUS
  Administered 2016-03-12: 25 ug via INTRAVENOUS

## 2016-03-12 MED ORDER — MEPERIDINE HCL 50 MG/ML IJ SOLN: 6.2500 mg | INTRAMUSCULAR | Status: DC | PRN

## 2016-03-12 MED ORDER — OXYCODONE HCL 5 MG/5ML PO SOLN
5.0000 mg | Freq: Once | ORAL | Status: DC | PRN
  Filled 2016-03-12: qty 5

## 2016-03-12 MED ORDER — KETOROLAC TROMETHAMINE 30 MG/ML IJ SOLN: 30.0000 mg | Freq: Once | INTRAMUSCULAR | Status: DC

## 2016-03-12 MED ORDER — ACETAMINOPHEN 160 MG/5ML PO SOLN: 325.0000 mg | ORAL | Status: DC | PRN

## 2016-03-12 MED ORDER — BELLADONNA ALKALOIDS-OPIUM 16.2-60 MG RE SUPP
RECTAL | Status: AC
  Filled 2016-03-12: qty 1

## 2016-03-12 MED ORDER — ONDANSETRON HCL 4 MG/2ML IJ SOLN
INTRAMUSCULAR | Status: DC | PRN
Start: 2016-03-12 — End: 2016-03-12
  Administered 2016-03-12: 4 mg via INTRAVENOUS

## 2016-03-12 MED ORDER — KETOROLAC TROMETHAMINE 30 MG/ML IJ SOLN
INTRAMUSCULAR | Status: AC
  Filled 2016-03-12: qty 1

## 2016-03-12 MED ORDER — BACITRACIN-NEOMYCIN-POLYMYXIN 400-5-5000 EX OINT: 1.0000 "application " | TOPICAL_OINTMENT | Freq: Two times a day (BID) | CUTANEOUS | 0 refills | Status: DC

## 2016-03-12 MED ORDER — FENTANYL CITRATE (PF) 100 MCG/2ML IJ SOLN
INTRAMUSCULAR | Status: AC
  Filled 2016-03-12: qty 2

## 2016-03-12 MED ORDER — TRAMADOL-ACETAMINOPHEN 37.5-325 MG PO TABS: 1.0000 | ORAL_TABLET | Freq: Four times a day (QID) | ORAL | 0 refills | Status: DC | PRN

## 2016-03-12 MED ORDER — SODIUM CHLORIDE 0.9 % IR SOLN
Status: DC | PRN
  Administered 2016-03-12: 18000 mL via INTRAVESICAL

## 2016-03-12 MED ORDER — PROPOFOL 10 MG/ML IV BOLUS
INTRAVENOUS | Status: AC
  Filled 2016-03-12: qty 20

## 2016-03-12 MED ORDER — PHENYLEPHRINE HCL 10 MG/ML IJ SOLN
INTRAMUSCULAR | Status: DC | PRN
  Administered 2016-03-12 (×2): 80 ug via INTRAVENOUS

## 2016-03-12 MED ORDER — ONDANSETRON HCL 4 MG/2ML IJ SOLN
INTRAMUSCULAR | Status: AC
  Filled 2016-03-12: qty 2

## 2016-03-12 MED ORDER — PROPOFOL 10 MG/ML IV BOLUS
INTRAVENOUS | Status: DC | PRN
  Administered 2016-03-12 (×3): 20 mg via INTRAVENOUS
  Administered 2016-03-12: 120 mg via INTRAVENOUS

## 2016-03-12 MED ORDER — LIDOCAINE HCL 2 % EX GEL
1.0000 "application " | Freq: Once | CUTANEOUS | Status: DC
  Filled 2016-03-12: qty 5

## 2016-03-12 MED ORDER — CEFAZOLIN SODIUM-DEXTROSE 2-4 GM/100ML-% IV SOLN
INTRAVENOUS | Status: AC
  Filled 2016-03-12: qty 100

## 2016-03-12 MED ORDER — LIDOCAINE HCL (CARDIAC) 20 MG/ML IV SOLN
INTRAVENOUS | Status: DC | PRN
  Administered 2016-03-12: 60 mg via INTRAVENOUS

## 2016-03-12 MED ORDER — KETOROLAC TROMETHAMINE 30 MG/ML IJ SOLN
INTRAMUSCULAR | Status: DC | PRN
  Administered 2016-03-12: 30 mg via INTRAVENOUS

## 2016-03-12 MED ORDER — BELLADONNA ALKALOIDS-OPIUM 16.2-60 MG RE SUPP
RECTAL | Status: DC | PRN
  Administered 2016-03-12: 1 via RECTAL

## 2016-03-12 MED ORDER — FENTANYL CITRATE (PF) 100 MCG/2ML IJ SOLN
25.0000 ug | INTRAMUSCULAR | Status: DC | PRN
  Administered 2016-03-12: 50 ug via INTRAVENOUS

## 2016-03-12 MED ORDER — OXYCODONE HCL 5 MG PO TABS: 5.0000 mg | ORAL_TABLET | Freq: Once | ORAL | Status: DC | PRN

## 2016-03-12 MED ORDER — SODIUM CHLORIDE 0.9 % IV SOLN
Freq: Once | INTRAVENOUS | Status: AC
  Administered 2016-03-12: 11:00:00 via INTRAVENOUS

## 2016-03-12 MED ORDER — ACETAMINOPHEN 500 MG PO TABS: 1000.0000 mg | ORAL_TABLET | ORAL | Status: DC

## 2016-03-12 MED ORDER — ACETAMINOPHEN 325 MG PO TABS: 325.0000 mg | ORAL_TABLET | ORAL | Status: DC | PRN

## 2016-03-12 MED ORDER — LIDOCAINE HCL 2 % EX GEL
CUTANEOUS | Status: AC
  Filled 2016-03-12: qty 5

## 2016-03-12 MED ORDER — HYOSCYAMINE SULFATE SL 0.125 MG SL SUBL: 0.1250 mg | SUBLINGUAL_TABLET | SUBLINGUAL | 2 refills | Status: DC | PRN

## 2016-03-12 MED ORDER — ONDANSETRON HCL 4 MG/2ML IJ SOLN
4.0000 mg | Freq: Once | INTRAMUSCULAR | Status: DC | PRN
Start: 2016-03-12 — End: 2016-03-12

## 2016-03-12 MED ORDER — TRIMETHOPRIM 100 MG PO TABS: 100.0000 mg | ORAL_TABLET | ORAL | 1 refills | Status: DC

## 2016-03-12 MED ORDER — CEFAZOLIN SODIUM-DEXTROSE 2-4 GM/100ML-% IV SOLN
2.0000 g | INTRAVENOUS | Status: AC
  Administered 2016-03-12: 2 g via INTRAVENOUS

## 2016-03-12 MED ORDER — FENTANYL CITRATE (PF) 100 MCG/2ML IJ SOLN
INTRAMUSCULAR | Status: AC
Start: 2016-03-12 — End: 2016-03-12
  Filled 2016-03-12: qty 2

## 2016-03-12 MED ORDER — PHENAZOPYRIDINE HCL 200 MG PO TABS: 200.0000 mg | ORAL_TABLET | Freq: Three times a day (TID) | ORAL | 3 refills | Status: DC | PRN

## 2016-03-12 MED ORDER — LACTATED RINGERS IV SOLN
INTRAVENOUS | Status: DC | PRN
  Administered 2016-03-12: 07:00:00 via INTRAVENOUS

## 2016-03-12 SURGICAL SUPPLY — 22 items
BAG URINE DRAINAGE (UROLOGICAL SUPPLIES) ×3 IMPLANT
BAG URO CATCHER STRL LF (MISCELLANEOUS) ×3 IMPLANT
CATH FOLEY 2WAY SLVR 30CC 20FR (CATHETERS) ×3 IMPLANT
CATH FOLEY 2WAY SLVR 30CC 22FR (CATHETERS) IMPLANT
CATH HEMA 3WAY 30CC 22FR COUDE (CATHETERS) ×3 IMPLANT
GLOVE BIOGEL M STRL SZ7.5 (GLOVE) ×6 IMPLANT
GOWN STRL REUS W/TWL LRG LVL3 (GOWN DISPOSABLE) ×9 IMPLANT
GOWN STRL REUS W/TWL XL LVL3 (GOWN DISPOSABLE) ×3 IMPLANT
HOLDER FOLEY CATH W/STRAP (MISCELLANEOUS) ×3 IMPLANT
IV NS IRRIG 3000ML ARTHROMATIC (IV SOLUTION) IMPLANT
LASER REVOLIX PROCEDURE (MISCELLANEOUS) ×3 IMPLANT
LOOP CUT BIPOLAR 24F LRG (ELECTROSURGICAL) IMPLANT
MANIFOLD NEPTUNE II (INSTRUMENTS) ×3 IMPLANT
NS IRRIG 1000ML POUR BTL (IV SOLUTION) ×3 IMPLANT
PACK CYSTO (CUSTOM PROCEDURE TRAY) ×3 IMPLANT
PLUG CATH AND CAP STER (CATHETERS) IMPLANT
SET ASPIRATION TUBING (TUBING) IMPLANT
SYR 30ML LL (SYRINGE) IMPLANT
SYRINGE IRR TOOMEY STRL 70CC (SYRINGE) IMPLANT
TUBING CONNECTING 10 (TUBING) ×4 IMPLANT
TUBING CONNECTING 10' (TUBING) ×2
WATER STERILE IRR 500ML POUR (IV SOLUTION) ×3 IMPLANT

## 2016-03-12 NOTE — Anesthesia Procedure Notes (Signed)
Procedure Name: LMA Insertion Date/Time: 03/12/2016 7:45 AM Performed by: Glory Buff Pre-anesthesia Checklist: Patient identified, Emergency Drugs available, Suction available and Patient being monitored Patient Re-evaluated:Patient Re-evaluated prior to inductionOxygen Delivery Method: Circle system utilized Preoxygenation: Pre-oxygenation with 100% oxygen Intubation Type: IV induction LMA: LMA inserted LMA Size: 4.0 Number of attempts: 1 Placement Confirmation: positive ETCO2 Tube secured with: Tape Dental Injury: Teeth and Oropharynx as per pre-operative assessment

## 2016-03-12 NOTE — Discharge Instructions (Addendum)
Foley Catheter Care, Adult °A Foley catheter is a soft, flexible tube. This tube is placed into your bladder to drain pee (urine). If you go home with this catheter in place, follow the instructions below. °TAKING CARE OF THE CATHETER °1. Wash your hands with soap and water. °2. Put soap and water on a clean washcloth. °¨ Clean the skin where the tube goes into your body. °§ Clean away from the tube site. °§ Never wipe toward the tube. °§ Clean the area using a circular motion. °¨ Remove all the soap. Pat the area dry with a clean towel. For males, reposition the skin that covers the end of the penis (foreskin). °3. Attach the tube to your leg with tape or a leg strap. Do not stretch the tube tight. If you are using tape, remove any stickiness left behind by past tape you used. °4. Keep the drainage bag below your hips. Keep it off the floor. °5. Check your tube during the day. Make sure it is working and draining. Make sure the tube does not curl, twist, or bend. °6. Do not pull on the tube or try to take it out. °TAKING CARE OF THE DRAINAGE BAGS °You will have a large overnight drainage bag and a small leg bag. You may wear the overnight bag any time. Never wear the small bag at night. Follow the directions below. °Emptying the Drainage Bag  °Empty your drainage bag when it is ?-½ full or at least 2-3 times a day. °1. Wash your hands with soap and water. °2. Keep the drainage bag below your hips. °3. Hold the dirty bag over the toilet or clean container. °4. Open the pour spout at the bottom of the bag. Empty the pee into the toilet or container. Do not let the pour spout touch anything. °5. Clean the pour spout with a gauze pad or cotton ball that has rubbing alcohol on it. °6. Close the pour spout. °7. Attach the bag to your leg with tape or a leg strap. °8. Wash your hands well. °Changing the Drainage Bag  °Change your bag once a month or sooner if it starts to smell or look dirty.  °1. Wash your hands with  soap and water. °2. Pinch the rubber tube so that pee does not spill out. °3. Disconnect the catheter tube from the drainage tube at the connection valve. Do not let the tubes touch anything. °4. Clean the end of the catheter tube with an alcohol wipe. Clean the end of a the drainage tube with a different alcohol wipe. °5. Connect the catheter tube to the drainage tube of the clean drainage bag. °6. Attach the new bag to the leg with tape or a leg strap. Avoid attaching the new bag too tightly. °7. Wash your hands well. °Cleaning the Drainage Bag  °1. Wash your hands with soap and water. °2. Wash the bag in warm, soapy water. °3. Rinse the bag with warm water. °4. Fill the bag with a mixture of white vinegar and water (1 cup vinegar to 1 quart warm water [.2 liter vinegar to 1 liter warm water]). Close the bag and soak it for 30 minutes in the solution. °5. Rinse the bag with warm water. °6. Hang the bag to dry with the pour spout open and hanging downward. °7. Store the clean bag (once it is dry) in a clean plastic bag. °8. Wash your hands well. °PREVENT INFECTION °· Wash your hands before and after touching   your tube.  Take showers every day. Wash the skin where the tube enters your body. Do not take baths. Replace wet leg straps with dry ones, if this applies.  Do not use powders, sprays, or lotions on the genital area. Only use creams, lotions, or ointments as told by your doctor.  For females, wipe from front to back after going to the bathroom.  Drink enough fluids to keep your pee clear or pale yellow unless you are told not to have too much fluid (fluid restriction).  Do not let the drainage bag or tubing touch or lie on the floor.  Wear cotton underwear to keep the area dry. GET HELP IF:  Your pee is cloudy or smells unusually bad.  Your tube becomes clogged.  You are not draining pee into the bag or your bladder feels full.  Your tube starts to leak. GET HELP RIGHT AWAY IF:  You  have pain, puffiness (swelling), redness, or yellowish-white fluid (pus) where the tube enters the body.  You have pain in the belly (abdomen), legs, lower back, or bladder.  You have a fever.  You see blood fill the tube, or your pee is pink or red.  You feel sick to your stomach (nauseous), throw up (vomit), or have chills.  Your tube gets pulled out. MAKE SURE YOU:   Understand these instructions.  Will watch your condition.  Will get help right away if you are not doing well or get worse. This information is not intended to replace advice given to you by your health care provider. Make sure you discuss any questions you have with your health care provider. Document Released: 05/25/2012 Document Revised: 02/18/2014 Document Reviewed: 01/14/2015 Elsevier Interactive Patient Education  2017 [employer]    Benign Prostatic Hyperplasia An enlarged prostate (benign prostatic hyperplasia) is common in older men. You may experience the following:  Weak urine stream.  Dribbling.  Feeling like the bladder has not emptied completely.  Difficulty starting urination.  Getting up frequently at night to urinate.  Urinating more frequently during the day. HOME CARE INSTRUCTIONS  Monitor your prostatic hyperplasia for any changes. The following actions may help to alleviate any discomfort you are experiencing:  Give yourself time when you urinate.  Stay away from alcohol.  Avoid beverages containing caffeine, such as coffee, tea, and colas, because they can make the problem worse.  Avoid decongestants, antihistamines, and some prescription medicines that can make the problem worse.  Follow up with your health care provider for further treatment as recommended. SEEK MEDICAL CARE IF:  You are experiencing progressive difficulty voiding.  Your urine stream is progressively getting narrower.  You are awaking from sleep with the urge to void more frequently.  You are  constantly feeling the need to void.  You experience loss of urine, especially in small amounts. SEEK IMMEDIATE MEDICAL CARE IF:   You develop increased pain with urination or are unable to urinate.  You develop severe abdominal pain, vomiting, a high fever, or fainting.  You develop back pain or blood in your urine. MAKE SURE YOU:   Understand these instructions.  Will watch your condition.  Will get help right away if you are not doing well or get worse. This information is not intended to replace advice given to you by your health care provider. Make sure you discuss any questions you have with your health care provider. Document Released: 01/28/2005 Document Revised: 02/18/2014 Document Reviewed: 06/30/2012 Elsevier Interactive Patient Education  2017  [employer]vier Inc. ° °

## 2016-03-12 NOTE — Interval H&P Note (Signed)
History and Physical Interval Note:  03/12/2016 7:15 AM  [name]  has presented today for surgery, with the diagnosis of BPH, RETENTION, BLADDER CANCER    The various methods of treatment have been discussed with the patient and family. After consideration of risks, benefits and other options for treatment, the patient has consented to  Procedure(s): THULIUM LASER TURP (TRANSURETHRAL RESECTION OF PROSTATE) (N/A) POSSIBLE TRANSURETHRAL RESECTION OF BLADDER TUMOR (TURBT) (N/A) as a surgical intervention .  The patient's history has been reviewed, patient examined, no change in status, stable for surgery.  I have reviewed the patient's chart and labs.  Questions were answered to the patient's satisfaction.     [name] I [name]  Pt seen and examined. He  Desires to go home post op. I have agreed to evaluate him in Recovery, and will decide depending on how well he does in surgery. He will need catheter for 2-3 days.

## 2016-03-12 NOTE — Anesthesia Preprocedure Evaluation (Addendum)
Anesthesia Evaluation  Patient identified by MRN, date of birth, ID band Patient awake    Reviewed: Allergy & Precautions, H&P , NPO status , Patient's Chart, lab work & pertinent test results, reviewed documented beta blocker date and time   Airway Mallampati: III  TM Distance: >3 FB Neck ROM: Full    Dental no notable dental hx. (+) Teeth Intact   Pulmonary neg pulmonary ROS, former smoker,    Pulmonary exam normal        Cardiovascular hypertension, Pt. on medications and Pt. on home beta blockers + CAD and + Past MI  Normal cardiovascular exam+ dysrhythmias Atrial Fibrillation  Rhythm:Regular Rate:Normal     Neuro/Psych negative neurological ROS  negative psych ROS   GI/Hepatic negative GI ROS, Neg liver ROS,   Endo/Other  negative endocrine ROS  Renal/GU Renal InsufficiencyRenal disease  negative genitourinary   Musculoskeletal   Abdominal Normal abdominal exam  (+)   Peds  Hematology negative hematology ROS (+) anemia ,   Anesthesia Other Findings   Reproductive/Obstetrics negative OB ROS                             Anesthesia Physical  Anesthesia Plan  ASA: III  Anesthesia Plan: General   Post-op Pain Management:    Induction: Intravenous  Airway Management Planned: LMA  Additional Equipment:   Intra-op Plan:   Post-operative Plan: Extubation in OR  Informed Consent: I have reviewed the patients History and Physical, chart, labs and discussed the procedure including the risks, benefits and alternatives for the proposed anesthesia with the patient or authorized representative who has indicated his/her understanding and acceptance.   Dental advisory given  Plan Discussed with: CRNA and Surgeon  Anesthesia Plan Comments:         Anesthesia Quick Evaluation

## 2016-03-12 NOTE — Transfer of Care (Signed)
Immediate Anesthesia Transfer of Care Note  Patient: [name]  Procedure(s) Performed: Procedure(s): THULIUM LASER TURP (TRANSURETHRAL RESECTION OF PROSTATE) (N/A) POSSIBLE TRANSURETHRAL RESECTION OF BLADDER TUMOR (TURBT) (N/A)  Patient Location: PACU  Anesthesia Type:General  Level of Consciousness: awake, alert  and oriented  Airway & Oxygen Therapy: Patient Spontanous Breathing and Patient connected to face mask oxygen  Post-op Assessment: Report given to RN  Post vital signs: Reviewed and stable  Last Vitals:  Vitals:   03/12/16 0544  BP: 129/71  Pulse: (!) 58  Resp: 16  Temp: 36.4 C    Last Pain:  Vitals:   03/12/16 0620  TempSrc:   PainSc: 0-No pain      Patients Stated Pain Goal: 4 ([id number])  Complications: No apparent anesthesia complications

## 2016-03-12 NOTE — Op Note (Signed)
Pre-operative diagnosis :   BPH, TCC bladder  Postoperative diagnosis:  Same  Operation: Cystoscopy, thulium laser Prostate  Surgeon:  S. Gaynelle Arabian, MD  First assistant: Rupert Stacks, MD, [facility] Resident  Anesthesia: General LMA  Preparation: After appropriate preanesthesia, the patient was brought to the operating room, placed on the operating table in the dorsal supine position where general LMA anesthesia was introduced. He was replaced in the dorsal lithotomy position with pubis was prepped with Betadine solution and draped in usual fashion. The history was double checked. The armband was double checked.  Review history:  [name] is a 76 year-old male established patient who is here for bladder cancer.  His problem was diagnosed 05/26/2015. His bladder cancer was diagnosed by [facility]. His cancer was diagnosed at [facility]. The bladder cancer was found because of blood in his urine.   His bladder cancer was treated by removal with scope and chemotherapy. Patient denies removal of the entire bladder and radiation.   His last cysto was 07/28/2015.   He does have a good appetite. BOWEL HABITS: his bowels are moving normally. He is not having pain in new locations. He has not recently had unwanted weight loss.   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     CC: BPH  HPI: The patient complains of lower urinary tract symptom(s) that include frequency, urgency, hesitancy, weak stream, intermittency, straining, nocturia, sense of incomplete emptying, and hematuria. The patient states his most bothersome symptom(s) are the following: urgency. Patient is currently treated with tamsulosin for his symptoms. His symptoms have been worse over the last  year. The patient states if he were to spend the rest of his life with his current urinary condition, he would be mostly disappointed. He complains of other associated symptom(s). He has previously tried dutasteride/tamsulosin for his symptoms.   Failing combination dutasteride/tamsulosin Rx. Now gross hematuria.      AUA Symptom Score: Less than 20% of the time he has the sensation of not emptying his bladder completely when finished urinating. Less than 50% of the time he has to urinate again fewer than two hours after he has finished urinating. Less than 20% of the time he has to start and stop again several times when he urinates. Less than 20% of the time he finds it difficult to postpone urination. 50% of the time he has a weak urinary stream. 50% of the time he has to push or strain to begin urination. He has to get up to urinate 2 times from the time he goes to bed until the time he gets up in the morning.   Calculated AUA Symptom Score: 13  Statement of  Likelihood of Success: Excellent. TIME-OUT observed.:  Procedure: Cystourethroscopy was accomplished, using the [employer] scope. However, I was unable to visualize the prostate and bladder well, and I changed scopes to the thulium laser scope, it allowed normal visualization. Cystoscopy revealed a small capacity bladder, with edema of the bladder neck and bladder base. There was no bladder cancer evident. The ureteral orifices were identified close to the bladder neck. There was no bladder stone or tumor identified. Trabeculation was noted. There was no cellule formation. There was no bladder stone noted.  I then selected a 1000  laser  fiber. The patient then underwent thulium laser vaporization of the prostate, with total laser time of 18 minutes and 56 seconds. When her 60,000, 297 J of energy were used, with fractionation of laser from 100 units, 110 units, 125 units, 150 units, and 200 units.  There was no tissue for pathology  review. Minimal bleeding was noted. At the end of the procedure, a size 20 [place] Foley catheter was placed, and 30 mL was placed in the balloon. No true bleeding was noted. The bladder was irrigated easily. The patient was awakened and taken to recovery room in good condition. At the beginning of the procedure, the patient received IV antibiotic, as well as a B and O suppository. At the end of the procedure, he received IV Toradol. He was awakened, and taken to recovery room in good condition.

## 2016-03-12 NOTE — Anesthesia Postprocedure Evaluation (Addendum)
Anesthesia Post Note  Patient: [name]  Procedure(s) Performed: Procedure(s) (LRB): THULIUM LASER TURP (TRANSURETHRAL RESECTION OF PROSTATE) (N/A) POSSIBLE TRANSURETHRAL RESECTION OF BLADDER TUMOR (TURBT) (N/A)  Patient location during evaluation: PACU Anesthesia Type: General Level of consciousness: awake Pain management: pain level controlled Vital Signs Assessment: post-procedure vital signs reviewed and stable Respiratory status: spontaneous breathing Cardiovascular status: stable Postop Assessment: no signs of nausea or vomiting Anesthetic complications: no        Last Vitals:  Vitals:   03/12/16 0945 03/12/16 1000  BP: 107/61 99/70  Pulse: 72 62  Resp: 20 12  Temp:      Last Pain:  Vitals:   03/12/16 1000  TempSrc:   PainSc: 2    Pain Goal: Patients Stated Pain Goal: 4 (03/12/16 0620)               Laritza Vokes JR,JOHN Mateo Flow

## 2016-03-14 DIAGNOSIS — N401 Enlarged prostate with lower urinary tract symptoms: Secondary | ICD-10-CM | POA: Diagnosis not present

## 2016-03-14 DIAGNOSIS — R3911 Hesitancy of micturition: Secondary | ICD-10-CM | POA: Diagnosis not present

## 2016-03-19 DIAGNOSIS — R3911 Hesitancy of micturition: Secondary | ICD-10-CM | POA: Diagnosis not present

## 2016-03-19 DIAGNOSIS — N401 Enlarged prostate with lower urinary tract symptoms: Secondary | ICD-10-CM | POA: Diagnosis not present

## 2016-03-19 DIAGNOSIS — R8271 Bacteriuria: Secondary | ICD-10-CM | POA: Diagnosis not present

## 2016-03-25 ENCOUNTER — Encounter (HOSPITAL_COMMUNITY): Payer: Self-pay | Admitting: Emergency Medicine

## 2016-03-25 ENCOUNTER — Emergency Department (HOSPITAL_COMMUNITY): Payer: PPO

## 2016-03-25 ENCOUNTER — Inpatient Hospital Stay (HOSPITAL_COMMUNITY)
Admission: EM | Admit: 2016-03-25 | Discharge: 2016-03-28 | DRG: 690 | Disposition: A | Discharge status: Home Health | Payer: PPO | Attending: Family Medicine | Admitting: Family Medicine

## 2016-03-25 DIAGNOSIS — E860 Dehydration: Secondary | ICD-10-CM | POA: Diagnosis present

## 2016-03-25 DIAGNOSIS — Z8249 Family history of ischemic heart disease and other diseases of the circulatory system: Secondary | ICD-10-CM

## 2016-03-25 DIAGNOSIS — Z811 Family history of alcohol abuse and dependence: Secondary | ICD-10-CM

## 2016-03-25 DIAGNOSIS — E876 Hypokalemia: Secondary | ICD-10-CM | POA: Diagnosis not present

## 2016-03-25 DIAGNOSIS — I714 Abdominal aortic aneurysm, without rupture: Secondary | ICD-10-CM | POA: Diagnosis present

## 2016-03-25 DIAGNOSIS — R531 Weakness: Secondary | ICD-10-CM

## 2016-03-25 DIAGNOSIS — K7460 Unspecified cirrhosis of liver: Secondary | ICD-10-CM | POA: Diagnosis not present

## 2016-03-25 DIAGNOSIS — N184 Chronic kidney disease, stage 4 (severe): Secondary | ICD-10-CM | POA: Diagnosis not present

## 2016-03-25 DIAGNOSIS — Z8711 Personal history of peptic ulcer disease: Secondary | ICD-10-CM

## 2016-03-25 DIAGNOSIS — N1330 Unspecified hydronephrosis: Secondary | ICD-10-CM | POA: Diagnosis not present

## 2016-03-25 DIAGNOSIS — E785 Hyperlipidemia, unspecified: Secondary | ICD-10-CM | POA: Diagnosis present

## 2016-03-25 DIAGNOSIS — I481 Persistent atrial fibrillation: Secondary | ICD-10-CM | POA: Diagnosis not present

## 2016-03-25 DIAGNOSIS — I482 Chronic atrial fibrillation, unspecified: Secondary | ICD-10-CM | POA: Diagnosis present

## 2016-03-25 DIAGNOSIS — R112 Nausea with vomiting, unspecified: Secondary | ICD-10-CM

## 2016-03-25 DIAGNOSIS — I2510 Atherosclerotic heart disease of native coronary artery without angina pectoris: Secondary | ICD-10-CM | POA: Diagnosis present

## 2016-03-25 DIAGNOSIS — Z91012 Allergy to eggs: Secondary | ICD-10-CM

## 2016-03-25 DIAGNOSIS — I739 Peripheral vascular disease, unspecified: Secondary | ICD-10-CM | POA: Diagnosis not present

## 2016-03-25 DIAGNOSIS — D62 Acute posthemorrhagic anemia: Secondary | ICD-10-CM | POA: Diagnosis present

## 2016-03-25 DIAGNOSIS — Z809 Family history of malignant neoplasm, unspecified: Secondary | ICD-10-CM

## 2016-03-25 DIAGNOSIS — N3001 Acute cystitis with hematuria: Secondary | ICD-10-CM | POA: Diagnosis not present

## 2016-03-25 DIAGNOSIS — Z87891 Personal history of nicotine dependence: Secondary | ICD-10-CM

## 2016-03-25 DIAGNOSIS — K5790 Diverticulosis of intestine, part unspecified, without perforation or abscess without bleeding: Secondary | ICD-10-CM | POA: Diagnosis present

## 2016-03-25 DIAGNOSIS — D494 Neoplasm of unspecified behavior of bladder: Secondary | ICD-10-CM | POA: Diagnosis not present

## 2016-03-25 DIAGNOSIS — J90 Pleural effusion, not elsewhere classified: Secondary | ICD-10-CM | POA: Diagnosis present

## 2016-03-25 DIAGNOSIS — K5900 Constipation, unspecified: Secondary | ICD-10-CM | POA: Diagnosis not present

## 2016-03-25 DIAGNOSIS — R319 Hematuria, unspecified: Secondary | ICD-10-CM | POA: Diagnosis not present

## 2016-03-25 DIAGNOSIS — Z825 Family history of asthma and other chronic lower respiratory diseases: Secondary | ICD-10-CM

## 2016-03-25 DIAGNOSIS — I480 Paroxysmal atrial fibrillation: Secondary | ICD-10-CM | POA: Diagnosis not present

## 2016-03-25 DIAGNOSIS — A419 Sepsis, unspecified organism: Secondary | ICD-10-CM

## 2016-03-25 DIAGNOSIS — R188 Other ascites: Secondary | ICD-10-CM | POA: Diagnosis present

## 2016-03-25 DIAGNOSIS — I252 Old myocardial infarction: Secondary | ICD-10-CM | POA: Diagnosis not present

## 2016-03-25 DIAGNOSIS — R1110 Vomiting, unspecified: Secondary | ICD-10-CM | POA: Diagnosis not present

## 2016-03-25 DIAGNOSIS — I130 Hypertensive heart and chronic kidney disease with heart failure and stage 1 through stage 4 chronic kidney disease, or unspecified chronic kidney disease: Secondary | ICD-10-CM | POA: Diagnosis present

## 2016-03-25 DIAGNOSIS — M109 Gout, unspecified: Secondary | ICD-10-CM | POA: Diagnosis present

## 2016-03-25 DIAGNOSIS — I5032 Chronic diastolic (congestive) heart failure: Secondary | ICD-10-CM | POA: Diagnosis not present

## 2016-03-25 DIAGNOSIS — C670 Malignant neoplasm of trigone of bladder: Secondary | ICD-10-CM | POA: Diagnosis not present

## 2016-03-25 DIAGNOSIS — D500 Iron deficiency anemia secondary to blood loss (chronic): Secondary | ICD-10-CM | POA: Diagnosis not present

## 2016-03-25 DIAGNOSIS — E871 Hypo-osmolality and hyponatremia: Secondary | ICD-10-CM | POA: Diagnosis not present

## 2016-03-25 DIAGNOSIS — I129 Hypertensive chronic kidney disease with stage 1 through stage 4 chronic kidney disease, or unspecified chronic kidney disease: Secondary | ICD-10-CM | POA: Diagnosis not present

## 2016-03-25 DIAGNOSIS — R9431 Abnormal electrocardiogram [ECG] [EKG]: Secondary | ICD-10-CM | POA: Diagnosis not present

## 2016-03-25 DIAGNOSIS — D649 Anemia, unspecified: Secondary | ICD-10-CM | POA: Diagnosis not present

## 2016-03-25 DIAGNOSIS — N183 Chronic kidney disease, stage 3 (moderate): Secondary | ICD-10-CM | POA: Diagnosis not present

## 2016-03-25 DIAGNOSIS — Z9841 Cataract extraction status, right eye: Secondary | ICD-10-CM

## 2016-03-25 DIAGNOSIS — Z9842 Cataract extraction status, left eye: Secondary | ICD-10-CM

## 2016-03-25 DIAGNOSIS — N390 Urinary tract infection, site not specified: Secondary | ICD-10-CM | POA: Diagnosis present

## 2016-03-25 DIAGNOSIS — D509 Iron deficiency anemia, unspecified: Secondary | ICD-10-CM | POA: Diagnosis not present

## 2016-03-25 DIAGNOSIS — Z8551 Personal history of malignant neoplasm of bladder: Secondary | ICD-10-CM

## 2016-03-25 DIAGNOSIS — I447 Left bundle-branch block, unspecified: Secondary | ICD-10-CM | POA: Diagnosis present

## 2016-03-25 DIAGNOSIS — R17 Unspecified jaundice: Secondary | ICD-10-CM

## 2016-03-25 DIAGNOSIS — R310 Gross hematuria: Secondary | ICD-10-CM | POA: Diagnosis present

## 2016-03-25 DIAGNOSIS — N189 Chronic kidney disease, unspecified: Secondary | ICD-10-CM | POA: Diagnosis not present

## 2016-03-25 DIAGNOSIS — H353130 Nonexudative age-related macular degeneration, bilateral, stage unspecified: Secondary | ICD-10-CM | POA: Diagnosis present

## 2016-03-25 LAB — URINALYSIS, MICROSCOPIC (REFLEX)
BACTERIA UA: NONE SEEN
SQUAMOUS EPITHELIAL / LPF: NONE SEEN

## 2016-03-25 LAB — CBC WITH DIFFERENTIAL/PLATELET
BASOS ABS: 0 10*3/uL (ref 0.0–0.1)
BASOS PCT: 0 %
EOS ABS: 0 10*3/uL (ref 0.0–0.7)
Eosinophils Relative: 0 %
HEMATOCRIT: 31.6 % — AB (ref 39.0–52.0)
HEMOGLOBIN: 10.6 g/dL — AB (ref 13.0–17.0)
Lymphocytes Relative: 4 %
Lymphs Abs: 0.6 10*3/uL — ABNORMAL LOW (ref 0.7–4.0)
MCH: 33.4 pg (ref 26.0–34.0)
MCHC: 33.5 g/dL (ref 30.0–36.0)
MCV: 99.7 fL (ref 78.0–100.0)
Monocytes Absolute: 0.7 10*3/uL (ref 0.1–1.0)
Monocytes Relative: 4 %
NEUTROS ABS: 17.1 10*3/uL — AB (ref 1.7–7.7)
NEUTROS PCT: 92 %
Platelets: 225 10*3/uL (ref 150–400)
RBC: 3.17 MIL/uL — ABNORMAL LOW (ref 4.22–5.81)
RDW: 15.5 % (ref 11.5–15.5)
WBC: 18.5 10*3/uL — ABNORMAL HIGH (ref 4.0–10.5)

## 2016-03-25 LAB — COMPREHENSIVE METABOLIC PANEL
ALBUMIN: 3.1 g/dL — AB (ref 3.5–5.0)
ALK PHOS: 70 U/L (ref 38–126)
ALT: 10 U/L — ABNORMAL LOW (ref 17–63)
ANION GAP: 12 (ref 5–15)
AST: 19 U/L (ref 15–41)
BILIRUBIN TOTAL: 1.3 mg/dL — AB (ref 0.3–1.2)
BUN: 22 mg/dL — ABNORMAL HIGH (ref 6–20)
CALCIUM: 8.5 mg/dL — AB (ref 8.9–10.3)
CO2: 28 mmol/L (ref 22–32)
Chloride: 93 mmol/L — ABNORMAL LOW (ref 101–111)
Creatinine, Ser: 1.66 mg/dL — ABNORMAL HIGH (ref 0.61–1.24)
GFR calc non Af Amer: 38 mL/min — ABNORMAL LOW (ref >60)
GFR, EST AFRICAN AMERICAN: 45 mL/min — AB (ref >60)
Glucose, Bld: 121 mg/dL — ABNORMAL HIGH (ref 65–99)
POTASSIUM: 4 mmol/L (ref 3.5–5.1)
Sodium: 133 mmol/L — ABNORMAL LOW (ref 135–145)
Total Protein: 6.1 g/dL — ABNORMAL LOW (ref 6.5–8.1)

## 2016-03-25 LAB — URINALYSIS, ROUTINE W REFLEX MICROSCOPIC

## 2016-03-25 LAB — I-STAT TROPONIN, ED: TROPONIN I, POC: 0 ng/mL (ref 0.00–0.08)

## 2016-03-25 LAB — I-STAT CG4 LACTIC ACID, ED: Lactic Acid, Venous: 1.03 mmol/L (ref 0.5–1.9)

## 2016-03-25 LAB — TROPONIN I

## 2016-03-25 LAB — CBG MONITORING, ED: Glucose-Capillary: 126 mg/dL — ABNORMAL HIGH (ref 65–99)

## 2016-03-25 LAB — LIPASE, BLOOD: LIPASE: 68 U/L — AB (ref 11–51)

## 2016-03-25 LAB — POC OCCULT BLOOD, ED: Fecal Occult Bld: NEGATIVE

## 2016-03-25 MED ORDER — DEXTROSE 5 % IV SOLN
2.0000 g | INTRAVENOUS | Status: DC
  Administered 2016-03-26: 2 g via INTRAVENOUS
  Filled 2016-03-25: qty 2

## 2016-03-25 MED ORDER — ALLOPURINOL 300 MG PO TABS
300.0000 mg | ORAL_TABLET | Freq: Every day | ORAL | Status: DC
  Administered 2016-03-26 – 2016-03-28 (×3): 300 mg via ORAL
  Filled 2016-03-25 (×4): qty 1

## 2016-03-25 MED ORDER — FOLIC ACID 1 MG PO TABS
1.0000 mg | ORAL_TABLET | Freq: Every day | ORAL | Status: DC
  Administered 2016-03-26 – 2016-03-28 (×3): 1 mg via ORAL
  Filled 2016-03-25 (×3): qty 1

## 2016-03-25 MED ORDER — METOPROLOL SUCCINATE ER 100 MG PO TB24
100.0000 mg | ORAL_TABLET | Freq: Every day | ORAL | Status: DC
  Administered 2016-03-26 – 2016-03-28 (×3): 100 mg via ORAL
  Filled 2016-03-25 (×3): qty 1

## 2016-03-25 MED ORDER — DABIGATRAN ETEXILATE MESYLATE 150 MG PO CAPS
150.0000 mg | ORAL_CAPSULE | Freq: Two times a day (BID) | ORAL | Status: DC
  Filled 2016-03-25 (×2): qty 1

## 2016-03-25 MED ORDER — VITAMIN B-1 100 MG PO TABS
100.0000 mg | ORAL_TABLET | Freq: Every day | ORAL | Status: DC
  Administered 2016-03-26 – 2016-03-28 (×3): 100 mg via ORAL
  Filled 2016-03-25 (×3): qty 1

## 2016-03-25 MED ORDER — LORAZEPAM 1 MG PO TABS: 1.0000 mg | ORAL_TABLET | Freq: Four times a day (QID) | ORAL | Status: DC | PRN

## 2016-03-25 MED ORDER — ROSUVASTATIN CALCIUM 20 MG PO TABS
20.0000 mg | ORAL_TABLET | ORAL | Status: DC
  Filled 2016-03-25: qty 1

## 2016-03-25 MED ORDER — POLYETHYLENE GLYCOL 3350 17 G PO PACK
17.0000 g | PACK | Freq: Two times a day (BID) | ORAL | Status: DC
  Administered 2016-03-25 – 2016-03-27 (×4): 17 g via ORAL
  Filled 2016-03-25 (×4): qty 1

## 2016-03-25 MED ORDER — PROMETHAZINE HCL 25 MG/ML IJ SOLN
12.5000 mg | Freq: Four times a day (QID) | INTRAMUSCULAR | Status: DC | PRN
  Filled 2016-03-25 (×2): qty 1

## 2016-03-25 MED ORDER — LORAZEPAM 1 MG PO TABS
0.0000 mg | ORAL_TABLET | Freq: Four times a day (QID) | ORAL | Status: AC
  Administered 2016-03-25 – 2016-03-26 (×2): 2 mg via ORAL
  Filled 2016-03-25 (×2): qty 2

## 2016-03-25 MED ORDER — PANTOPRAZOLE SODIUM 40 MG IV SOLR
40.0000 mg | Freq: Two times a day (BID) | INTRAVENOUS | Status: DC
  Administered 2016-03-26 (×2): 40 mg via INTRAVENOUS
  Filled 2016-03-25 (×3): qty 40

## 2016-03-25 MED ORDER — ADULT MULTIVITAMIN W/MINERALS CH
1.0000 | ORAL_TABLET | Freq: Every day | ORAL | Status: DC
  Administered 2016-03-26 – 2016-03-27 (×2): 1 via ORAL
  Filled 2016-03-25 (×3): qty 1

## 2016-03-25 MED ORDER — ONDANSETRON HCL 4 MG/2ML IJ SOLN: 4.0000 mg | Freq: Four times a day (QID) | INTRAMUSCULAR | Status: DC | PRN

## 2016-03-25 MED ORDER — THIAMINE HCL 100 MG/ML IJ SOLN: 100.0000 mg | Freq: Every day | INTRAMUSCULAR | Status: DC

## 2016-03-25 MED ORDER — SODIUM CHLORIDE 0.9 % IV BOLUS (SEPSIS)
2000.0000 mL | Freq: Once | INTRAVENOUS | Status: AC
  Administered 2016-03-25: 2000 mL via INTRAVENOUS

## 2016-03-25 MED ORDER — LORAZEPAM 2 MG/ML IJ SOLN: 1.0000 mg | Freq: Four times a day (QID) | INTRAMUSCULAR | Status: DC | PRN

## 2016-03-25 MED ORDER — AMLODIPINE BESYLATE 5 MG PO TABS
5.0000 mg | ORAL_TABLET | Freq: Every day | ORAL | Status: DC
  Administered 2016-03-26: 5 mg via ORAL
  Filled 2016-03-25: qty 1

## 2016-03-25 MED ORDER — BISACODYL 10 MG RE SUPP: 20.0000 mg | Freq: Once | RECTAL | Status: DC

## 2016-03-25 MED ORDER — CEFEPIME HCL 2 G IJ SOLR
2.0000 g | Freq: Once | INTRAMUSCULAR | Status: AC
  Administered 2016-03-25: 2 g via INTRAVENOUS
  Filled 2016-03-25: qty 2

## 2016-03-25 MED ORDER — HYOSCYAMINE SULFATE 0.125 MG SL SUBL
0.1250 mg | SUBLINGUAL_TABLET | SUBLINGUAL | Status: DC | PRN
  Filled 2016-03-25: qty 1

## 2016-03-25 MED ORDER — SENNA 8.6 MG PO TABS
2.0000 | ORAL_TABLET | Freq: Every day | ORAL | Status: DC
  Administered 2016-03-25 – 2016-03-26 (×2): 17.2 mg via ORAL
  Filled 2016-03-25 (×2): qty 2

## 2016-03-25 MED ORDER — BISACODYL 10 MG RE SUPP
10.0000 mg | Freq: Once | RECTAL | Status: DC
  Filled 2016-03-25: qty 1

## 2016-03-25 MED ORDER — LORAZEPAM 1 MG PO TABS: 0.0000 mg | ORAL_TABLET | Freq: Two times a day (BID) | ORAL | Status: DC

## 2016-03-25 MED ORDER — THIAMINE HCL 100 MG/ML IJ SOLN
100.0000 mg | Freq: Once | INTRAMUSCULAR | Status: AC
  Administered 2016-03-25: 100 mg via INTRAVENOUS
  Filled 2016-03-25: qty 2

## 2016-03-25 NOTE — ED Notes (Signed)
Jacubowitz at bedside. 

## 2016-03-25 NOTE — Progress Notes (Signed)
Pharmacy Antibiotic Note  [name] is a 76 y.o. male with PMH of a-fib, MI,  bladder cancer, BPH, s/p cystocoscopy/thulium laster TURP on 03/12/16 who presents to [facility] ED on 03/25/16 with vomiting x 2 days, weakness, and leukocytosis. Pharmacy has been consulted for Cefepime dosing due to concern for UTI.  Plan: Cefepime 2g IV q24h. Monitor renal function, cultures, clinical course.   Height: 5\' 8"  (172.7 cm) Weight: 182 lb (82.6 kg) IBW/kg (Calculated) : 68.4  Temp (24hrs), Avg:97.4 F (36.3 C), Min:97.4 F (36.3 C), Max:97.4 F (36.3 C)   Recent Labs Lab 03/25/16 1500  WBC 18.5*  CREATININE 1.66*    Estimated Creatinine Clearance: 39.7 mL/min (by C-G formula based on SCr of 1.66 mg/dL (H)).    Allergies  Allergen Reactions  . Eggs Or Egg-Derived Products Nausea And Vomiting    Antimicrobials this admission: PTA Trimethoprim 2/12 >> Cefepime >>  Dose adjustments this admission: --  Microbiology results: 2/12 BCx: ordered 2/12 UCx: sent    Thank you for allowing pharmacy to be a part of this patient's care.    Lindell Spar, PharmD, BCPS Pager: [phone] 03/25/2016 5:47 PM

## 2016-03-25 NOTE — Progress Notes (Signed)
This RN entered room to assess pt and discuss plan of care. Bisacodyl Suppository ordered for constipation, pt. Refused despite education given. Miralax and Senokot given. Pt. Refusing all other medications. He states he took his crestor and pradaxa at home already before coming here. He is refusing all other new medication orders. Pt. Educated and still refused. Will continue to monitor.

## 2016-03-25 NOTE — ED Notes (Signed)
I said nurse attempted two times to attempt for second set of blood cultures.

## 2016-03-25 NOTE — ED Notes (Signed)
Patient states that he had labs drawn this AM at PCP office. Patient has lab results print out with him. Patient asking not to be stuck again.

## 2016-03-25 NOTE — H&P (Addendum)
History and Physical    [name] [id number] DOB: [birth year] DOA: 03/25/2016  Referring MD/NP/PA: Orlie Dakin PCP: Tivis Ringer, MD  Outpatient Specialists: Gaynelle Arabian, Urology and [place], Cardiology  Patient coming from: home  Chief Complaint: weakness, nausea  HPI: [name] is a 76 y.o. male with a history of CAD and chronic atrial fibrillation on pradaxa, diverticulosis, PUD, and bladder cancer diagnosed in 2017 being treated by Dr. Gaynelle Arabian.  He was feeling well with usual hematuria, however, his Urologist detected a thickening of the bladder wall.  Patient underwent TURP with  Cystoscopy and thulium laser on 03/12/2016.  He was supposed to take trimethoprim postprocedure however it caused him to have nausea so he only took it for 2 days and then stopped. Postprocedure, he had some mild fatigue which has aggressively worsened and he has had severe fatigue with difficulty ambulating for the last 2 days. He developed problems with constipation shortly after procedure for which he took Dulcolax which caused him to have severe watery diarrhea. A few days later he had a soft formed brown bowel movement. Since then, it has been 3-4 days and he has had no further bowel movements. For the last 2-3 days he has had anorexia with severe nausea and frequent emesis particularly at night, 4-5 times each night. His emesis is dark brown to black without odor, nonbilious, no obvious blood.he denies fevers, chills, cough, shortness of breath. He denies abdominal pains. He denies dysuria, increased urinary frequency or urgency. He wears a pad at night to catch his urine. He came to the emergency department today due to progressive weakness.  ED Course:  Afebrile.  Blood pressure mildly elevated 151/69. White blood cell count of 18.5 up from 7.8 the day prior to his procedure. His hemoglobin has trended down just slightly.  His creatinine is 1.66, approximately at baselinebut his mild  hyponatremia suggests dehydration. His urinalysis was positive for copious blood and copious white blood cells.  KUB demonstrated moderate stool throughout the colon. No evidence of obstruction on KUB. He was given IV fluids and started on cefepime for UTI.   Review of Systems:  General:  Denies fevers, chills, weight loss or gain HEENT:  Denies changes to hearing and vision, rhinorrhea, sinus congestion, sore throat CV:  Denies chest pain and palpitations, chronic trace lower extremity edema.  PULM:  Denies SOB, wheezing, cough.   GI:  Per HPI GU:  Denies dysuria, frequency, urgency ENDO:  Denies polyuria, polydipsia.   HEME:  Possible hematemesis (brown emesis) and chronic hematuria  LYMPH:  Denies lymphadenopathy.   MSK:  Denies arthralgias, myalgias.   DERM:  Denies skin rash or ulcer.   NEURO:  Denies focal numbness, weakness, slurred speech, confusion, facial droop. Diffuse weakness PSYCH:  Denies anxiety and depression.    Past Medical History:  Diagnosis Date  . AAA (abdominal aortic aneurysm) ([facility]) 02-25-2011   US abdomen 3.4 cm x 3 cm last US done per pt  . Anemia   . CAD (coronary artery disease)   . Cancer [facility])    Bladder cancer  . Chronic atrial fibrillation (HCC)    Dr. Angelena Form  . Duodenal ulcer   . Dysrhythmia    Atrial fibrillation-Dr. Angelena Form follows  . Gout   . Heart attack   . Heart murmur   . Hematuria    "bladder cancer"  . Hemorrhoids   . History of blood transfusion    last -5 yrs ago "ulcer'  . History of  GI bleed   . Hyperlipidemia   . Hypertension   . Macular degeneration, dry    BILATERAL  . Myocardial infarction    history of. 1983  . Renal insufficiency     Past Surgical History:  Procedure Laterality Date  . CARDIAC CATHETERIZATION     '83  . CAROTID ENDARTERECTOMY     bilaterally 2007  . CATARACT EXTRACTION Bilateral 2-3 yrs ago  . Cath/angioplasy     [id number], [facility] by Dr. Gilda Crease [facility] of angioplasty)  . COLOSTOMY N/A  04/07/2012   Procedure: COLOSTOMY;  Surgeon: Edward Jolly, MD;  Location: [facility];  Service: General;  Laterality: N/A;  Sigmoid Colectomy Colostomy   . COLOSTOMY TAKEDOWN N/A 09/30/2012   Procedure: TAKEDOWN OF HARTMAN COLOSTOMY;  Surgeon: Edward Jolly, MD;  Location: [facility];  Service: General;  Laterality: N/A;  . CYSTOSCOPY     done in Office -Dr. Gaynelle Arabian 05-15-15  . CYSTOSCOPY N/A 07/28/2015   Procedure: CYSTOSCOPY;  Surgeon: [name], MD;  Location: [facility];  Service: Urology;  Laterality: N/A;  . CYSTOSCOPY W/ URETERAL STENT PLACEMENT Right 05/26/2015   Procedure: CYSTOSCOPY WITH LEFT RETROGRADE PYELOGRAM/URETERAL ;  Surgeon: [name], MD;  Location: [facility];  Service: Urology;  Laterality: Right;  . ESOPHAGOGASTRODUODENOSCOPY  04/06/2011   Procedure: ESOPHAGOGASTRODUODENOSCOPY (EGD);  Surgeon: Beryle Beams, MD;  Location: [facility];  Service: Endoscopy;  Laterality: N/A;  . EYE SURGERY  2011   bilateral  . LAMINECTOMY N/A 05/23/2012   Procedure: LUMBAR four-five LAMINECTOMY ;  Surgeon: Kristeen Miss, MD;  Location: [facility];  Service: Neurosurgery;  Laterality: N/A;  . No colonoscopy to date     ("I don't have a good excuse"). SOC reviewed  . PARTIAL COLECTOMY N/A 04/07/2012   Procedure: PARTIAL COLECTOMY;  Surgeon: Edward Jolly, MD;  Location: [facility];  Service: General;  Laterality: N/A;  Sigmoid Colectomy Colostomy hartman procedure  . pyloric stenosis    . TONSILLECTOMY  as child  . tooth extraction  09/24/2013   and dental implant  . TRANSURETHRAL RESECTION OF BLADDER TUMOR N/A 05/26/2015   Procedure: TRANSURETHRAL RESECTION OF BLADDER TUMOR (TURBT);  Surgeon: [name], MD;  Location: [facility];  Service: Urology;  Laterality: N/A;  . TRANSURETHRAL RESECTION OF BLADDER TUMOR N/A 07/28/2015   Procedure: TRANSURETHRAL RESECTION OF BLADDER  BIOPSY;  Surgeon: [name], MD;  Location: [facility];  Service: Urology;  Laterality: N/A;    . TRANSURETHRAL RESECTION OF BLADDER TUMOR N/A 03/12/2016   Procedure: POSSIBLE TRANSURETHRAL RESECTION OF BLADDER TUMOR (TURBT);  Surgeon: [name], MD;  Location: [facility];  Service: Urology;  Laterality: N/A;     reports that he quit smoking about 35 years ago. His smoking use included Cigarettes. He has a 100.00 pack-year smoking history. He has never used smokeless tobacco. He reports that he drinks about 12.6 oz of alcohol per week . He reports that he does not use drugs.  No Active Allergies  Family History  Problem Relation Age of Onset  . Heart attack Father   . Heart disease Father     After age 69  . Cancer Father   . Cancer Mother   . COPD Mother   . Emphysema Mother   . Obesity Sister   . Alcohol abuse Brother   . Colon cancer Neg Hx     Prior to Admission medications   Medication Sig Start Date End Date Taking? Authorizing Provider  allopurinol (ZYLOPRIM) 300 MG  tablet Take 300 mg by mouth daily.   Yes Historical Provider, MD  amLODipine (NORVASC) 5 MG tablet Take 5 mg by mouth daily.    Yes Historical Provider, MD  dabigatran (PRADAXA) 150 MG CAPS capsule Take 150 mg by mouth 2 (two) times daily.   Yes Historical Provider, MD  hyoscyamine (LEVSIN SL) 0.125 MG SL tablet Place 0.125 mg under the tongue every 4 (four) hours as needed for cramping.   Yes Historical Provider, MD  metoprolol succinate (TOPROL-XL) 100 MG 24 hr tablet Take 100 mg by mouth daily. Take with or immediately following a meal.   Yes Historical Provider, MD  Multiple Vitamins-Minerals (PRESERVISION AREDS PO) Take 1 capsule by mouth 2 (two) times daily.   Yes Historical Provider, MD  omeprazole (PRILOSEC) 40 MG capsule Take 40 mg by mouth every evening.    Yes Historical Provider, MD  rosuvastatin (CRESTOR) 20 MG tablet Take 20 mg by mouth 2 (two) times a week.    Yes Hendricks Limes, MD    Physical Exam: Vitals:   03/25/16 1627 03/25/16 1746 03/25/16 1901 03/25/16 1904  BP: 126/62   (!)  151/69  Pulse: 81   78  Resp: 25   (!) 21  Temp:    97.7 F (36.5 C)  TempSrc:    Oral  SpO2: 95%   99%  Weight:  82.6 kg (182 lb) 82.6 kg (182 lb)   Height:  5\' 8"  (1.727 m) 5\' 8"  (1.727 m)     Constitutional: NAD, calm, comfortable Eyes: PERRL, lids and conjunctivae normal ENMT: Mucous membranes are moist. Posterior pharynx clear of any exudate or lesions.Normal dentition.  Neck: normal, supple, no masses, no thyromegaly Respiratory: clear to auscultation bilaterally, no wheezing, no crackles. Normal respiratory effort. No accessory muscle use.  Cardiovascular:  IRRR with 1/6 systolic murmur, no rubs / gallops. 1+ pitting bilateral extremity edema. 2+ pedal pulses.  Abdomen: NABS, soft, moderately distended, nontender Musculoskeletal: no clubbing / cyanosis. No joint deformity upper and lower extremities. Good ROM, no contractures. Normal muscle tone.  Skin: no rashes, lesions, ulcers. No induration Neurologic: CN 2-12 grossly intact. Sensation intact, DTR normal. Strength 5/5 in all 4.  Psychiatric: Normal judgment and insight. Alert and oriented x 3. Normal mood.   Labs on Admission: I have personally reviewed following labs and imaging studies  CBC:  Recent Labs Lab 03/25/16 1500  WBC 18.5*  NEUTROABS 17.1*  HGB 10.6*  HCT 31.6*  MCV 99.7  PLT 123456   Basic Metabolic Panel:  Recent Labs Lab 03/25/16 1500  NA 133*  K 4.0  CL 93*  CO2 28  GLUCOSE 121*  BUN 22*  CREATININE 1.66*  CALCIUM 8.5*   GFR: Estimated Creatinine Clearance: 39.7 mL/min (by C-G formula based on SCr of 1.66 mg/dL (H)). Liver Function Tests:  Recent Labs Lab 03/25/16 1500  AST 19  ALT 10*  ALKPHOS 70  BILITOT 1.3*  PROT 6.1*  ALBUMIN 3.1*    Recent Labs Lab 03/25/16 1500  LIPASE 68*   No results for input(s): AMMONIA in the last 168 hours. Coagulation Profile: No results for input(s): INR, PROTIME in the last 168 hours. Cardiac Enzymes: No results for input(s): CKTOTAL,  CKMB, CKMBINDEX, TROPONINI in the last 168 hours. BNP (last 3 results) No results for input(s): PROBNP in the last 8760 hours. HbA1C: No results for input(s): HGBA1C in the last 72 hours. CBG:  Recent Labs Lab 03/25/16 1323  GLUCAP 126*   Lipid Profile:  No results for input(s): CHOL, HDL, LDLCALC, TRIG, CHOLHDL, LDLDIRECT in the last 72 hours. Thyroid Function Tests: No results for input(s): TSH, T4TOTAL, FREET4, T3FREE, THYROIDAB in the last 72 hours. Anemia Panel: No results for input(s): VITAMINB12, FOLATE, FERRITIN, TIBC, IRON, RETICCTPCT in the last 72 hours. Urine analysis:    Component Value Date/Time   COLORURINE RED (A) 03/25/2016 1500   APPEARANCEUR TURBID (A) 03/25/2016 1500   LABSPEC  03/25/2016 1500    TEST NOT REPORTED DUE TO COLOR INTERFERENCE OF URINE PIGMENT   PHURINE  03/25/2016 1500    TEST NOT REPORTED DUE TO COLOR INTERFERENCE OF URINE PIGMENT   GLUCOSEU (A) 03/25/2016 1500    TEST NOT REPORTED DUE TO COLOR INTERFERENCE OF URINE PIGMENT   GLUCOSEU NEGATIVE 08/23/2010 0750   HGBUR (A) 03/25/2016 1500    TEST NOT REPORTED DUE TO COLOR INTERFERENCE OF URINE PIGMENT   BILIRUBINUR (A) 03/25/2016 1500    TEST NOT REPORTED DUE TO COLOR INTERFERENCE OF URINE PIGMENT   KETONESUR (A) 03/25/2016 1500    TEST NOT REPORTED DUE TO COLOR INTERFERENCE OF URINE PIGMENT   PROTEINUR (A) 03/25/2016 1500    TEST NOT REPORTED DUE TO COLOR INTERFERENCE OF URINE PIGMENT   UROBILINOGEN 0.2 05/23/2012 1618   NITRITE (A) 03/25/2016 1500    TEST NOT REPORTED DUE TO COLOR INTERFERENCE OF URINE PIGMENT   LEUKOCYTESUR (A) 03/25/2016 1500    TEST NOT REPORTED DUE TO COLOR INTERFERENCE OF URINE PIGMENT   Sepsis Labs: @LABRCNTIP (procalcitonin:4,lacticidven:4) )No results found for this or any previous visit (from the past 240 hour(s)).   Radiological Exams on Admission: Dg Abd Acute W/chest  Result Date: 03/25/2016 CLINICAL DATA:  Initial evaluation for acute vomiting,  constipation. EXAM: DG ABDOMEN ACUTE W/ 1V CHEST COMPARISON:  Prior CT from 05/10/2015. FINDINGS: Mild cardiomegaly. Mediastinal silhouette within normal limits. Aortic atherosclerosis noted. Lungs normally inflated. Blunting of the bilateral costophrenic angles suggests small pleural effusions. Associated patchy and linear bibasilar opacities, favored to reflect atelectasis. Superimposed infiltrates not excluded. No other focal infiltrates. No pulmonary edema. No pneumothorax. Bowel gas pattern within normal limits without evidence for obstruction or ileus. No free air. No abnormal bowel wall thickening. Moderate amount of retained stool within the colon. No soft tissue mass identified. Scattered vascular calcifications noted within the abdomen. Mild scoliosis with multilevel degenerative spondylolysis noted within the visualized spine. Advanced osteoarthritic changes noted about the hips. No acute osseous abnormality. IMPRESSION: 1. Nonobstructive bowel gas pattern with no radiographic evidence for acute intra-abdominal process. 2. Moderate amount of retained stool within the colon. 3. Small bilateral pleural effusions. Associated bibasilar opacities favored to reflect atelectasis, although possible infiltrates could be considered in the correct clinical setting. 4. Extensive aortic atherosclerosis. Electronically Signed   By: Jeannine Boga M.D.   On: 03/25/2016 14:53    EKG: Independently reviewed.  Rate controlled a-fib 92 bpm, no evidence of acute ischemia.    Assessment/Plan Active Problems:   UTI (urinary tract infection)   Generalized weakness with nausea and vomiting, ddx includes UTI, gallstones (elevated bilirubin), gastritis from EtOH, pancreatitis (mildly elevated lipase), ACS (new LBBB on ECG).   -  Up with assistance -  F/u urine culture -  Continue cefepime (recently instrumented) -  Telemetry -  Cycle troponins -  ECHO -  TSH -  RUQ [place] -  Repeat LFTs and lipase in AM -  No  further IVF due to lower extremity edema and effusions on CXR -  Phenergan -  Avoid zofran due to QTc prolongation -  Will work on constipation as below -  Continue PPI - PT/OT evaluations  Possible sepsis (elevated WBC, minimally elevated respirations) due to possible UTI -  Lactic acid negative -  Work up and treatment for UTI as above  Constipation -  TSH -  Bisacodyl suppositories -  miralax BID -  Senna  Bladder cancer with recent TURP -  Per Urology  Acute blood loss anemia, hemoglobin trending down slightly from baseline -  Repeat hgb in AM -  Iron studies, B12, folate -  TSH -  Occult stool  CAD, chest pain free but new LBBB on ECG -  pradaxa (not on aspirin) -  increase to daily rosuvastatin -  Continue metoprolol -  ACS work up as above  Chronic atrial fibrillation, rate controlled -  Continue pradaxa -   Continue metoprolol  Prolonged QTc -  Avoid QTc prolonging medications -  Check magnesium  PUD, continue PPI  Chronic kidney disease stage 3/4, creatinine near baseline -  Avoid nephrotoxic agents -  Renally dose medications  Mild hyponatremia -  F/u ECHO -  F/u TSH -  No further IVF  EtOH use daily -  CIWA protocol  DVT prophylaxis: pradaxa  Code Status: full Family Communication: patient's son at bedside  Disposition Plan:  Home in a few days  Consults called: none  Admission status: inpatient   Janece Canterbury MD [facility] Pager [phone]  If 7PM-7AM, please contact night-coverage [email or web address] Password [facility]  03/25/2016, 7:48 PM

## 2016-03-25 NOTE — Progress Notes (Signed)
This RN received a call from [place] asking the last time pt. Had anything to eat or drink. It was 2000 since the last time pt. Had intake. Was advised to keep pt. NPO from time of call at 2200 in order to be able to get [place] of abdomen scan done at 0600. [email or web address] and fluids taken away from bedside. Will continue to monitor.

## 2016-03-25 NOTE — ED Notes (Signed)
I stuck patient x1 for blood cultures, unsuccessful x1.

## 2016-03-25 NOTE — ED Provider Notes (Addendum)
[place] DEPT Provider Note   CSN: [id number] Arrival date & time: 03/25/16  1306     History   Chief Complaint Chief Complaint  Patient presents with  . Weakness  . sent by PCP for elevated WBC    HPI [name] is a 76 y.o. male.  HPI Patient was seen by Dr.Avva earlier today. Sent here for further evaluation. He's vomited 9 times over the past 2 days. Known no vomiting or nausea today. He is last bowel movement was 4 days ago. Patient had bladder surgery approximately 2 weeks ago. He's had diminished appetite since surgery. He denies any fever denies chest pain denies shortness of breath no other associated symptoms. He denies fever. Generalized weakness is described as lightheadedness worse with standing and improved with lying supine. No treatment prior to cominghere Past Medical History:  Diagnosis Date  . AAA (abdominal aortic aneurysm) ([place]) 02-25-2011   US abdomen 3.4 cm x 3 cm last US done per pt  . Anemia   . CAD (coronary artery disease)   . Cancer [facility])    Bladder cancer  . Chronic atrial fibrillation (HCC)    Dr. Angelena Form  . Duodenal ulcer   . Dysrhythmia    Atrial fibrillation-Dr. Angelena Form follows  . Gout   . Heart attack   . Heart murmur   . Hematuria    "bladder cancer"  . Hemorrhoids   . History of blood transfusion    last -5 yrs ago "ulcer'  . History of GI bleed   . Hyperlipidemia   . Hypertension   . Macular degeneration, dry    BILATERAL  . Myocardial infarction    history of. 1983  . Renal insufficiency     Patient Active Problem List   Diagnosis Date Noted  . Bladder tumor 05/26/2015  . Occlusion and stenosis of carotid artery without mention of cerebral infarction 08/17/2013  . Leg mass 12/28/2012  . Varicose veins of lower extremities with other complications [id number]  . Spinal stenosis, lumbarwith synovial cyst 05/23/2012  . Paraparesis of both lower limbs ([place]) 05/23/2012  . Colonic diverticular abscess 01/17/2012  .  Acute duodenal ulcer with hemorrhage 04/08/2011  . Erosive gastritis with hemorrhage 04/08/2011  . Acute posthemorrhagic anemia 04/08/2011  . Carotid artery disease ([place]) 10/30/2010  . Unspecified vitamin D deficiency 08/28/2010  . Gout 08/28/2010  . HEARING LOSS, LEFT EAR 03/08/2010  . RHINITIS 03/08/2010  . ANEMIA-NOS 03/02/2009  . OTHER NONTHROMBOCYTOPENIC PURPURAS 03/02/2009  . RENAL INSUFFICIENCY 03/02/2009  . MUSCLE PAIN 03/02/2009  . HYPERLIPIDEMIA 12/24/2007  . HYPERTENSION 12/24/2007  . MYOCARDIAL INFARCTION, HX OF 12/24/2007  . CORONARY ARTERY DISEASE 12/24/2007  . ATRIAL FIBRILLATION 12/24/2007  . HYPERGLYCEMIA, FASTING 12/24/2007    Past Surgical History:  Procedure Laterality Date  . CARDIAC CATHETERIZATION     '83  . CAROTID ENDARTERECTOMY     bilaterally 2007  . CATARACT EXTRACTION Bilateral 2-3 yrs ago  . Cath/angioplasy     [id number], [facility] by Dr. Gilda Crease [facility] of angioplasty)  . COLOSTOMY N/A 04/07/2012   Procedure: COLOSTOMY;  Surgeon: Edward Jolly, MD;  Location: [facility];  Service: General;  Laterality: N/A;  Sigmoid Colectomy Colostomy   . COLOSTOMY TAKEDOWN N/A 09/30/2012   Procedure: TAKEDOWN OF HARTMAN COLOSTOMY;  Surgeon: Edward Jolly, MD;  Location: [facility];  Service: General;  Laterality: N/A;  . CYSTOSCOPY     done in Office -Dr. Gaynelle Arabian 05-15-15  . CYSTOSCOPY N/A 07/28/2015   Procedure: CYSTOSCOPY;  Surgeon: [name], MD;  Location: [facility];  Service: Urology;  Laterality: N/A;  . CYSTOSCOPY W/ URETERAL STENT PLACEMENT Right 05/26/2015   Procedure: CYSTOSCOPY WITH LEFT RETROGRADE PYELOGRAM/URETERAL ;  Surgeon: [name], MD;  Location: [facility];  Service: Urology;  Laterality: Right;  . ESOPHAGOGASTRODUODENOSCOPY  04/06/2011   Procedure: ESOPHAGOGASTRODUODENOSCOPY (EGD);  Surgeon: Beryle Beams, MD;  Location: [facility];  Service: Endoscopy;  Laterality: N/A;  . EYE SURGERY  2011   bilateral  . LAMINECTOMY N/A  05/23/2012   Procedure: LUMBAR four-five LAMINECTOMY ;  Surgeon: Kristeen Miss, MD;  Location: [facility];  Service: Neurosurgery;  Laterality: N/A;  . No colonoscopy to date     ("I don't have a good excuse"). SOC reviewed  . PARTIAL COLECTOMY N/A 04/07/2012   Procedure: PARTIAL COLECTOMY;  Surgeon: Edward Jolly, MD;  Location[facility];  Service: General;  Laterality: N/A;  Sigmoid Colectomy Colostomy hartman procedure  . pyloric stenosis    . TONSILLECTOMY  as child  . tooth extraction  09/24/16   and dental implant  . TRANSURETHRAL RESECTION OF BLADDER TUMOR N/A 05/26/2015   Procedure: TRANSURETHRAL RESECTION OF BLADDER TUMOR (TURBT);  Surgeon: [name], MD;  Location[facility];  Service: Urology;  Laterality: N/A;  . TRANSURETHRAL RESECTION OF BLADDER TUMOR N/A 07/28/2015   Procedure: TRANSURETHRAL RESECTION OF BLADDER  BIOPSY;  Surgeon: [name], MD;  [facility];  Service: Urology;  Laterality: N/A;  . TRANSURETHRAL RESECTION OF BLADDER TUMOR N/A 03/12/2016   Procedure: POSSIBLE TRANSURETHRAL RESECTION OF BLADDER TUMOR (TURBT);  Surgeon: [name], MD;  Location[facility];  Service: Urology;  Laterality: N/A;       Home Medications    Prior to Admission medications   Medication Sig Start Date End Date Taking? Authorizing Provider  allopurinol (ZYLOPRIM) 300 MG tablet Take 300 mg by mouth daily. 01/29/16   Historical Provider, MD  amLODipine (NORVASC) 5 MG tablet Take 5 mg by mouth every morning.  06/01/12   Historical Provider, MD  metoprolol succinate (TOPROL-XL) 100 MG 24 hr tablet Take 100 mg by mouth daily. Take with or immediately following a meal.    Historical Provider, MD  Multiple Vitamins-Minerals (PRESERVISION AREDS PO) Take 1 capsule by mouth 2 (two) times daily.    Historical Provider, MD  neomycin-bacitracin-polymyxin (NEOSPORIN) ointment Apply 1 application topically 2 (two) times daily. Apply to penile meatus 2x/day 03/12/16   [name], MD  omeprazole (PRILOSEC) 40 MG capsule Take 40 mg by mouth every evening.     Historical Provider, MD  rosuvastatin (CRESTOR) 20 MG tablet Take 20 mg by mouth 2 (two) times a week. Take on Monday and Friday. 12/03/11   Hendricks Limes, MD    Family History Family History  Problem Relation Age of Onset  . Heart attack Father   . Heart disease Father     After age 66  . Cancer Father   . Cancer Mother   . COPD Mother   . Emphysema Mother   . Obesity Sister   . Alcohol abuse Brother   . Colon cancer Neg Hx     Social History Social History  Substance Use Topics  . Smoking status: Former Smoker    Packs/day: 3.00    Years: 25.00    Types: Cigarettes    Quit date: 02/11/1981  . Smokeless tobacco: Never Used  . Alcohol use 12.6 oz/week    21 Shots of liquor per week  Comment: daily 2-3 per day     Allergies   Eggs or egg-derived products   Review of Systems Review of Systems  Constitutional: Positive for appetite change.  HENT: Negative.   Respiratory: Negative.   Cardiovascular: Negative.   Gastrointestinal: Positive for nausea and vomiting.  Genitourinary: Positive for hematuria.       Hematuria since bladder surgery approximately 2 weeks ago  Musculoskeletal: Negative.   Skin: Negative.   Neurological: Positive for weakness.       Generalized weakness  Psychiatric/Behavioral: Negative.   All other systems reviewed and are negative.    Physical Exam Updated Vital Signs BP 117/65 (BP Location: Left Arm)   Pulse 88   Temp 97.4 F (36.3 C) (Oral)   Resp 18   SpO2 100%   Physical Exam  Constitutional: He is oriented to person, place, and time. He appears well-developed and well-nourished.  HENT:  Head: Normocephalic and atraumatic.  Eyes: Conjunctivae are normal. Pupils are equal, round, and reactive to light.  Neck: Neck supple. No tracheal deviation present. No thyromegaly present.  Cardiovascular: Normal rate.   No murmur  heard. Irregularly irregular  Pulmonary/Chest: Effort normal and breath sounds normal.  Abdominal: Soft. Bowel sounds are normal. He exhibits no distension. There is no tenderness.  Genitourinary: Penis normal.  Genitourinary Comments: Rectal normal tone brown stool no gross blood  Musculoskeletal: Normal range of motion. He exhibits no edema or tenderness.  Neurological: He is alert and oriented to person, place, and time. No cranial nerve deficit. Coordination normal.  Gait normal, mildly lightheaded on standing  Skin: Skin is warm and dry. No rash noted.  Psychiatric: He has a normal mood and affect.  Nursing note and vitals reviewed.    ED Treatments / Results  Labs (all labs ordered are listed, but only abnormal results are displayed) Labs Reviewed  CBG MONITORING, ED - Abnormal; Notable for the following:       Result Value   Glucose-Capillary 126 (*)    All other components within normal limits  CBC WITH DIFFERENTIAL/PLATELET  LIPASE, BLOOD  URINALYSIS, ROUTINE W REFLEX MICROSCOPIC  COMPREHENSIVE METABOLIC PANEL  CBG MONITORING, ED  I-STAT TROPOININ, ED  POC OCCULT BLOOD, ED    EKG  EKG Interpretation  Date/Time:  Monday March 25 2016 13:20:32 EST Ventricular Rate:  92 PR Interval:    QRS Duration: 140 QT Interval:  413 QTC Calculation: 511 R Axis:   12 Text Interpretation:  Atrial fibrillation Left bundle branch block Right bundle branch block New since previous tracing Confirmed by Winfred Leeds  MD, Terrin Meddaugh [phone]) on 03/25/2016 2:09:37 PM      Results for orders placed or performed during the hospital encounter of 03/25/16  CBC with Differential/Platelet  Result Value Ref Range   WBC 18.5 (H) 4.0 - 10.5 K/uL   RBC 3.17 (L) 4.22 - 5.81 MIL/uL   Hemoglobin 10.6 (L) 13.0 - 17.0 g/dL   HCT 31.6 (L) 39.0 - 52.0 %   MCV 99.7 78.0 - 100.0 fL   MCH 33.4 26.0 - 34.0 pg   MCHC 33.5 30.0 - 36.0 g/dL   RDW 15.5 11.5 - 15.5 %   Platelets 225 150 - 400 K/uL    Neutrophils Relative % 92 %   Neutro Abs 17.1 (H) 1.7 - 7.7 K/uL   Lymphocytes Relative 4 %   Lymphs Abs 0.6 (L) 0.7 - 4.0 K/uL   Monocytes Relative 4 %   Monocytes Absolute 0.7 0.1 - 1.0 K/uL  Eosinophils Relative 0 %   Eosinophils Absolute 0.0 0.0 - 0.7 K/uL   Basophils Relative 0 %   Basophils Absolute 0.0 0.0 - 0.1 K/uL  Lipase, blood  Result Value Ref Range   Lipase 68 (H) 11 - 51 U/L  Urinalysis, Routine w reflex microscopic  Result Value Ref Range   Color, Urine RED (A) YELLOW   APPearance TURBID (A) CLEAR   Specific Gravity, Urine  1.005 - 1.030    TEST NOT REPORTED DUE TO COLOR INTERFERENCE OF URINE PIGMENT   pH  5.0 - 8.0    TEST NOT REPORTED DUE TO COLOR INTERFERENCE OF URINE PIGMENT   Glucose, UA (A) NEGATIVE mg/dL    TEST NOT REPORTED DUE TO COLOR INTERFERENCE OF URINE PIGMENT   Hgb urine dipstick (A) NEGATIVE    TEST NOT REPORTED DUE TO COLOR INTERFERENCE OF URINE PIGMENT   Bilirubin Urine (A) NEGATIVE    TEST NOT REPORTED DUE TO COLOR INTERFERENCE OF URINE PIGMENT   Ketones, ur (A) NEGATIVE mg/dL    TEST NOT REPORTED DUE TO COLOR INTERFERENCE OF URINE PIGMENT   Protein, ur (A) NEGATIVE mg/dL    TEST NOT REPORTED DUE TO COLOR INTERFERENCE OF URINE PIGMENT   Nitrite (A) NEGATIVE    TEST NOT REPORTED DUE TO COLOR INTERFERENCE OF URINE PIGMENT   Leukocytes, UA (A) NEGATIVE    TEST NOT REPORTED DUE TO COLOR INTERFERENCE OF URINE PIGMENT  Comprehensive metabolic panel  Result Value Ref Range   Sodium 133 (L) 135 - 145 mmol/L   Potassium 4.0 3.5 - 5.1 mmol/L   Chloride 93 (L) 101 - 111 mmol/L   CO2 28 22 - 32 mmol/L   Glucose, Bld 121 (H) 65 - 99 mg/dL   BUN 22 (H) 6 - 20 mg/dL   Creatinine, Ser 1.66 (H) 0.61 - 1.24 mg/dL   Calcium 8.5 (L) 8.9 - 10.3 mg/dL   Total Protein 6.1 (L) 6.5 - 8.1 g/dL   Albumin 3.1 (L) 3.5 - 5.0 g/dL   AST 19 15 - 41 U/L   ALT 10 (L) 17 - 63 U/L   Alkaline Phosphatase 70 38 - 126 U/L   Total Bilirubin 1.3 (H) 0.3 - 1.2 mg/dL   GFR  calc non Af Amer 38 (L) >60 mL/min   GFR calc Af Amer 45 (L) >60 mL/min   Anion gap 12 5 - 15  Urinalysis, Microscopic (reflex)  Result Value Ref Range   RBC / HPF TOO NUMEROUS TO COUNT 0 - 5 RBC/hpf   WBC, UA TOO NUMEROUS TO COUNT 0 - 5 WBC/hpf   Bacteria, UA NONE SEEN NONE SEEN   Squamous Epithelial / LPF NONE SEEN NONE SEEN   Mucous PRESENT   CBG monitoring, ED  Result Value Ref Range   Glucose-Capillary 126 (H) 65 - 99 mg/dL  I-stat troponin, ED  Result Value Ref Range   Troponin i, poc 0.00 0.00 - 0.08 ng/mL   Comment 3          POC occult blood, ED Provider will collect  Result Value Ref Range   Fecal Occult Bld NEGATIVE NEGATIVE  I-Stat CG4 Lactic Acid, ED  (not at  [facility])  Result Value Ref Range   Lactic Acid, Venous 1.03 0.5 - 1.9 mmol/L   Dg Abd Acute W/chest  Result Date: 03/25/2016 CLINICAL DATA:  Initial evaluation for acute vomiting, constipation. EXAM: DG ABDOMEN ACUTE W/ 1V CHEST COMPARISON:  Prior CT from 05/10/2015. FINDINGS: Mild cardiomegaly.  Mediastinal silhouette within normal limits. Aortic atherosclerosis noted. Lungs normally inflated. Blunting of the bilateral costophrenic angles suggests small pleural effusions. Associated patchy and linear bibasilar opacities, favored to reflect atelectasis. Superimposed infiltrates not excluded. No other focal infiltrates. No pulmonary edema. No pneumothorax. Bowel gas pattern within normal limits without evidence for obstruction or ileus. No free air. No abnormal bowel wall thickening. Moderate amount of retained stool within the colon. No soft tissue mass identified. Scattered vascular calcifications noted within the abdomen. Mild scoliosis with multilevel degenerative spondylolysis noted within the visualized spine. Advanced osteoarthritic changes noted about the hips. No acute osseous abnormality. IMPRESSION: 1. Nonobstructive bowel gas pattern with no radiographic evidence for acute intra-abdominal process. 2. Moderate  amount of retained stool within the colon. 3. Small bilateral pleural effusions. Associated bibasilar opacities favored to reflect atelectasis, although possible infiltrates could be considered in the correct clinical setting. 4. Extensive aortic atherosclerosis. Electronically Signed   By: Jeannine Boga M.D.   On: 03/25/2016 14:53   X-rays viewed by me Radiology No results found.  Procedures Procedures (including critical care time)  Medications Ordered in ED Medications  sodium chloride 0.9 % bolus 2,000 mL (not administered)  thiamine (B-1) injection 100 mg (not administered)     Initial Impression / Assessment and Plan / ED Course  I have reviewed the triage vital signs and the nursing notes.  Pertinent labs & imaging results that were available during my care of the patient were reviewed by me and considered in my medical decision making (see chart for details).     Further history from Dr.Avva by telephone. He was concerned about ileus post surgery and possible occult infection given leukocytosis. Specifically concern for urinary tract infection Code sepsis called based on leukocytosis, source of infection urine. IV antibiotics ordered. I've consulted Dr. Sheran Fava from hospital service who will see patient in hospital. 5:45 PM resting comfortably after treatment with intravenous fluids. Plan IV antibiotics ordered. Urine sent for culture. Blood cultures pending. Renal insufficiency is chronic Final Clinical Impressions(s) / ED Diagnoses  Diagnoses #1 sepsis #2 urinary tract infection Final diagnoses:  None  #3 chronic renal insufficiency #4 anemia #5 constipation  New Prescriptions New Prescriptions   No medications on file     Orlie Dakin, MD 03/25/16 [place], MD 03/25/16 1800

## 2016-03-25 NOTE — ED Notes (Signed)
Admitting MD at bedside.

## 2016-03-25 NOTE — ED Triage Notes (Signed)
Patient c/o weakness x 4 days. Patient has bladder cancer and not been eating as much over the past several days as well as constipation.  Patient also has lower abd pain. Patient sent from PCP for elevated WBC.

## 2016-03-26 ENCOUNTER — Inpatient Hospital Stay (HOSPITAL_COMMUNITY): Payer: PPO

## 2016-03-26 DIAGNOSIS — I2510 Atherosclerotic heart disease of native coronary artery without angina pectoris: Secondary | ICD-10-CM

## 2016-03-26 DIAGNOSIS — R9431 Abnormal electrocardiogram [ECG] [EKG]: Secondary | ICD-10-CM

## 2016-03-26 DIAGNOSIS — N390 Urinary tract infection, site not specified: Principal | ICD-10-CM

## 2016-03-26 DIAGNOSIS — I447 Left bundle-branch block, unspecified: Secondary | ICD-10-CM | POA: Diagnosis present

## 2016-03-26 DIAGNOSIS — R319 Hematuria, unspecified: Secondary | ICD-10-CM | POA: Diagnosis present

## 2016-03-26 LAB — CBC
HCT: 25.9 % — ABNORMAL LOW (ref 39.0–52.0)
HCT: 25.4 % — ABNORMAL LOW (ref 39.0–52.0)
HCT: 25 % — ABNORMAL LOW (ref 39.0–52.0)
HEMOGLOBIN: 8.3 g/dL — AB (ref 13.0–17.0)
Hemoglobin: 8.8 g/dL — ABNORMAL LOW (ref 13.0–17.0)
Hemoglobin: 8.4 g/dL — ABNORMAL LOW (ref 13.0–17.0)
MCH: 34.5 pg — AB (ref 26.0–34.0)
MCH: 33.3 pg (ref 26.0–34.0)
MCH: 33.5 pg (ref 26.0–34.0)
MCHC: 34 g/dL (ref 30.0–36.0)
MCHC: 33.1 g/dL (ref 30.0–36.0)
MCHC: 33.2 g/dL (ref 30.0–36.0)
MCV: 101.6 fL — AB (ref 78.0–100.0)
MCV: 100.8 fL — ABNORMAL HIGH (ref 78.0–100.0)
MCV: 100.8 fL — ABNORMAL HIGH (ref 78.0–100.0)
PLATELETS: 181 10*3/uL (ref 150–400)
PLATELETS: 179 10*3/uL (ref 150–400)
PLATELETS: 176 10*3/uL (ref 150–400)
RBC: 2.55 MIL/uL — AB (ref 4.22–5.81)
RBC: 2.52 MIL/uL — ABNORMAL LOW (ref 4.22–5.81)
RBC: 2.48 MIL/uL — AB (ref 4.22–5.81)
RDW: 15.9 % — ABNORMAL HIGH (ref 11.5–15.5)
RDW: 15.7 % — AB (ref 11.5–15.5)
RDW: 15.7 % — ABNORMAL HIGH (ref 11.5–15.5)
WBC: 12.1 10*3/uL — ABNORMAL HIGH (ref 4.0–10.5)
WBC: 11.2 10*3/uL — AB (ref 4.0–10.5)
WBC: 12.7 10*3/uL — ABNORMAL HIGH (ref 4.0–10.5)

## 2016-03-26 LAB — COMPREHENSIVE METABOLIC PANEL WITH GFR
ALT: 9 U/L — ABNORMAL LOW (ref 17–63)
AST: 16 U/L (ref 15–41)
Albumin: 2.5 g/dL — ABNORMAL LOW (ref 3.5–5.0)
Alkaline Phosphatase: 55 U/L (ref 38–126)
Anion gap: 8 (ref 5–15)
BUN: 20 mg/dL (ref 6–20)
CO2: 27 mmol/L (ref 22–32)
Calcium: 8 mg/dL — ABNORMAL LOW (ref 8.9–10.3)
Chloride: 98 mmol/L — ABNORMAL LOW (ref 101–111)
Creatinine, Ser: 1.58 mg/dL — ABNORMAL HIGH (ref 0.61–1.24)
GFR calc Af Amer: 47 mL/min — ABNORMAL LOW
GFR calc non Af Amer: 41 mL/min — ABNORMAL LOW
Glucose, Bld: 94 mg/dL (ref 65–99)
Potassium: 3.5 mmol/L (ref 3.5–5.1)
Sodium: 133 mmol/L — ABNORMAL LOW (ref 135–145)
Total Bilirubin: 0.9 mg/dL (ref 0.3–1.2)
Total Protein: 5 g/dL — ABNORMAL LOW (ref 6.5–8.1)

## 2016-03-26 LAB — ECHOCARDIOGRAM COMPLETE
Height: 68 in
Weight: 2912 [oz_av]

## 2016-03-26 LAB — IRON AND TIBC
Iron: 22 ug/dL — ABNORMAL LOW (ref 45–182)
Saturation Ratios: 9 % — ABNORMAL LOW (ref 17.9–39.5)
TIBC: 238 ug/dL — ABNORMAL LOW (ref 250–450)
UIBC: 216 ug/dL

## 2016-03-26 LAB — PREPARE RBC (CROSSMATCH)

## 2016-03-26 LAB — FOLATE: Folate: 7 ng/mL (ref >5.9)

## 2016-03-26 LAB — VITAMIN B12: Vitamin B-12: 249 pg/mL (ref 180–914)

## 2016-03-26 LAB — TSH: TSH: 1.035 u[IU]/mL (ref 0.350–4.500)

## 2016-03-26 LAB — TROPONIN I
Troponin I: <0.03 ng/mL
Troponin I: <0.03 ng/mL (ref <0.03)

## 2016-03-26 LAB — LIPASE, BLOOD: Lipase: 74 U/L — ABNORMAL HIGH (ref 11–51)

## 2016-03-26 LAB — FERRITIN: Ferritin: 94 ng/mL (ref 24–336)

## 2016-03-26 LAB — BRAIN NATRIURETIC PEPTIDE: B NATRIURETIC PEPTIDE 5: 425.1 pg/mL — AB (ref 0.0–100.0)

## 2016-03-26 LAB — MAGNESIUM: Magnesium: 1.7 mg/dL (ref 1.7–2.4)

## 2016-03-26 MED ORDER — SODIUM CHLORIDE 0.9 % IV SOLN
510.0000 mg | Freq: Once | INTRAVENOUS | Status: AC
  Administered 2016-03-26: 510 mg via INTRAVENOUS
  Filled 2016-03-26: qty 17

## 2016-03-26 MED ORDER — SODIUM CHLORIDE 0.9 % IV SOLN
Freq: Once | INTRAVENOUS | Status: AC
  Administered 2016-03-26: 16:00:00 via INTRAVENOUS

## 2016-03-26 MED ORDER — MAGNESIUM SULFATE 2 GM/50ML IV SOLN
2.0000 g | Freq: Once | INTRAVENOUS | Status: AC
  Administered 2016-03-26: 2 g via INTRAVENOUS
  Filled 2016-03-26: qty 50

## 2016-03-26 MED ORDER — FUROSEMIDE 10 MG/ML IJ SOLN
40.0000 mg | Freq: Once | INTRAMUSCULAR | Status: AC
  Administered 2016-03-26: 40 mg via INTRAVENOUS
  Filled 2016-03-26: qty 4

## 2016-03-26 MED ORDER — FINASTERIDE 5 MG PO TABS
5.0000 mg | ORAL_TABLET | Freq: Every day | ORAL | Status: DC
  Administered 2016-03-26 – 2016-03-28 (×3): 5 mg via ORAL
  Filled 2016-03-26 (×3): qty 1

## 2016-03-26 NOTE — Progress Notes (Signed)
Echocardiogram 2D Echocardiogram has been performed.  Aggie Cosier 03/26/2016, 1:35 PM

## 2016-03-26 NOTE — Progress Notes (Signed)
CCMD called and alerted RN that PT had a 2 second pause on the heart monitor.  Pt asleep.  MD made aware, will continue to monitor closely.

## 2016-03-26 NOTE — Care Management Note (Signed)
Case Management Note  Patient Details  Name: KOICHI CROCITTO MRN: [id number] Date of Birth: [birth year]  Subjective/Objective:  PT recc HHPT-provided spouse w/HHC agency list-await choice. Await HHPT,[id number] order.                  Action/Plan:d/c home w/HHC.   Expected Discharge Date:   (unknown)               Expected Discharge Plan:  [place]  In-House Referral:     Discharge planning Services  CM Consult  Post Acute Care Choice:    Choice offered to:  Spouse  DME Arranged:    DME Agency:     HH Arranged:    [place] Agency:     Status of Service:  In process, will continue to follow  If discussed at Long Length of Stay Meetings, dates discussed:    Additional Comments:  Dessa Phi, RN 03/26/2016, 12:58 PM

## 2016-03-26 NOTE — Evaluation (Signed)
Occupational Therapy Evaluation Patient Details Name: [name] MRN: [id number] DOB: 1941/01/26 Today's Date: 03/26/2016    History of Present Illness [name] is a 76 y.o. male with a history of CAD and chronic atrial fibrillation , diverticulosis, PUD, and bladder cancer diagnosed in 2017 , usual hematuriT URP with  Cystoscopy on 03/12/2016. Admitted with  progressive weakness   Clinical Impression   Pt admitted with progressive weakness.   Pt currently with functional limitations due to the deficits listed below (see OT Problem List). Pt will benefit from skilled OT to increase their safety and independence with ADL and functional mobility for ADL to facilitate discharge to venue listed below.      Follow Up Recommendations  SNF  - depending on progress. May be able to go home   Equipment Recommendations  Other (comment) (TBD)    Recommendations for Other Services       Precautions / Restrictions Precautions Precautions: Fall Precaution Comments: incontinence of bloody urine- a lot, wear pullups      Mobility Bed Mobility Overal bed mobility: Needs Assistance Bed Mobility: Supine to Sit     Supine to sit: HOB elevated;Min assist     General bed mobility comments: extra time  to initiate the activity, assist with trunk to upright  Transfers Overall transfer level: Needs assistance Equipment used: Rolling walker (2 wheeled) Transfers: Sit to/from [facility] Sit to Stand: Min assist Stand pivot transfers: Min assist       General transfer comment: steady assist to rise from the bed. Moderate volume of incontinet bloody urine upon standing. RN aware. Small steps to recliner with RW.     Balance Overall balance assessment: Needs assistance Sitting-balance support: Feet supported;No upper extremity supported Sitting balance-Leahy Scale: Fair     Standing balance support: During functional activity;Bilateral upper extremity  supported Standing balance-Leahy Scale: Fair Standing balance comment: stood with  UE support at [facility] for  briefs to be pulled up                            ADL Overall ADL's : Needs assistance/impaired Eating/Feeding: Sitting;Set up   Grooming: Set up;Sitting   Upper Body Bathing: Minimal assistance;Sitting   Lower Body Bathing: Maximal assistance;Sit to/from stand;Cueing for safety   Upper Body Dressing : Minimal assistance;Sitting   Lower Body Dressing: Maximal assistance;Sit to/from stand;Cueing for safety;Cueing for sequencing   Toilet Transfer: RW;Ambulation;Minimal assistance   Toileting- Clothing Manipulation and Hygiene: Moderate assistance;Sit to/from stand;Cueing for sequencing;Cueing for safety                         Pertinent Vitals/Pain Pain Assessment: Faces Faces Pain Scale: Hurts little more Pain Location: when urinated Pain Descriptors / Indicators: Burning Pain Intervention(s): Monitored during session     Hand Dominance     Extremity/Trunk Assessment Upper Extremity Assessment Upper Extremity Assessment: Generalized weakness   Lower Extremity Assessment Lower Extremity Assessment: Generalized weakness   Cervical / Trunk Assessment Cervical / Trunk Assessment: Kyphotic   Communication Communication Communication: No difficulties   Cognition Arousal/Alertness: Awake/alert Behavior During Therapy: Flat affect Overall Cognitive Status: Within Functional Limits for tasks assessed         Following Commands: Follows multi-step commands with increased time                      Home Living Family/patient expects to  be discharged to:: Private residence Living Arrangements: Spouse/significant other Available Help at Discharge: Family Type of Home: House Home Access: Stairs to enter     [place]: Two level     Bathroom Shower/Tub: Chief Strategy Officer: None          Prior  Functioning/Environment Level of Independence: Independent                 OT Problem List: Decreased strength;Decreased safety awareness;Decreased activity tolerance   OT Treatment/Interventions: Self-care/ADL training;DME and/or AE instruction;Patient/family education    OT Goals(Current goals can be found in the care plan section) Acute Rehab OT Goals Patient Stated Goal: agreed to  getting OOB OT Goal Formulation: With patient Time For Goal Achievement: 04/09/16  OT Frequency: Min 2X/week   Barriers to D/C:            Co-evaluation PT/OT/SLP Co-Evaluation/Treatment: Yes Reason for Co-Treatment: For patient/therapist safety PT goals addressed during session: Mobility/safety with mobility OT goals addressed during session: ADL's and self-care      End of Session Equipment Utilized During Treatment: Rolling walker Nurse Communication: Mobility status  Activity Tolerance: Patient tolerated treatment well Patient left: in chair;with call bell/phone within reach;with family/visitor present   Time: [id number] OT Time Calculation (min): 30 min Charges:  OT General Charges $OT Visit: 1 Procedure OT Evaluation $OT Eval Moderate Complexity: 1 Procedure G-Codes:    Payton Mccallum D 03/31/2016, 12:59 PM

## 2016-03-26 NOTE — Evaluation (Signed)
Physical Therapy Evaluation Patient Details Name: [name] MRN: [id number] DOB: 11/16/1940 Today's Date: 03/26/2016   History of Present Illness  [name] is a 76 y.o. male with a history of CAD and chronic atrial fibrillation , diverticulosis, PUD, and bladder cancer diagnosed in 2017 , usual hematuriT URP with  Cystoscopy on 03/12/2016. Admitted with  progressive weakness  Clinical Impression  The patient  Required much encouragement for mobility. Assisted  To stand with incontinence. Pt admitted with above diagnosis. Pt currently with functional limitations due to the deficits listed below (see PT Problem List).  Pt will benefit from skilled PT to increase their independence and safety with mobility to allow discharge to the venue listed below.     Follow Up Recommendations Home health PT;Supervision/Assistance - 24 hour (vs SNF if progress is slower than anticipated)    Equipment Recommendations    RW   Recommendations for Other Services       Precautions / Restrictions Precautions Precautions: Fall Precaution Comments: incontinence of bloody urine- a lot, wear pullups      Mobility  Bed Mobility Overal bed mobility: Needs Assistance Bed Mobility: Supine to Sit     Supine to sit: HOB elevated;Min assist     General bed mobility comments: extra time  to initiate the activity, assist with trunk to upright  Transfers Overall transfer level: Needs assistance Equipment used: Rolling walker (2 wheeled) Transfers: Sit to/from [facility] Sit to Stand: Min assist Stand pivot transfers: Min assist       General transfer comment: steady assist to rise from the bed. Moderate volume of incontinent bloody urine upon standing. RN aware. Small steps to recliner with RW.   Ambulation/Gait                Stairs            Wheelchair Mobility    Modified Rankin (Stroke Patients Only)       Balance Overall balance assessment: Needs  assistance Sitting-balance support: Feet supported;No upper extremity supported Sitting balance-Leahy Scale: Fair     Standing balance support: During functional activity;Bilateral upper extremity supported Standing balance-Leahy Scale: Fair Standing balance comment: stood with  UE support at [facility] for  briefs to be pulled up                             Pertinent Vitals/Pain Pain Assessment: Faces Faces Pain Scale: Hurts little more Pain Location: when urinated Pain Descriptors / Indicators: Burning Pain Intervention(s): Monitored during session    Home Living Family/patient expects to be discharged to:: Private residence Living Arrangements: Spouse/significant other Available Help at Discharge: Family Type of Home: House Home Access: Stairs to enter     Home Layout: Two level Home Equipment: None      Prior Function Level of Independence: Independent               Hand Dominance        Extremity/Trunk Assessment        Lower Extremity Assessment Lower Extremity Assessment: Generalized weakness    Cervical / Trunk Assessment Cervical / Trunk Assessment: Kyphotic  Communication   Communication: No difficulties  Cognition Arousal/Alertness: Awake/alert Behavior During Therapy: Flat affect Overall Cognitive Status: Within Functional Limits for tasks assessed         Following Commands: Follows multi-step commands with increased time  General Comments      Exercises     Assessment/Plan    PT Assessment Patient needs continued PT services  PT Problem List Decreased strength;Decreased activity tolerance;Decreased mobility;Decreased knowledge of precautions;Decreased safety awareness;Decreased knowledge of use of DME          PT Treatment Interventions DME instruction;Gait training;Functional mobility training;Therapeutic activities;Therapeutic exercise;Patient/family education    PT Goals (Current goals can be found in  the Care Plan section)  Acute Rehab PT Goals Patient Stated Goal: agreed to  getting OOB PT Goal Formulation: With patient/family Time For Goal Achievement: 04/09/16 Potential to Achieve Goals: Good    Frequency Min 3X/week   Barriers to discharge        Co-evaluation PT/OT/SLP Co-Evaluation/Treatment: Yes Reason for Co-Treatment: For patient/therapist safety PT goals addressed during session: Mobility/safety with mobility OT goals addressed during session: ADL's and self-care       End of Session   Activity Tolerance: Patient tolerated treatment well Patient left: in chair;with call bell/phone within reach;with chair alarm set;with family/visitor present Nurse Communication: Mobility status         Time: [id number] PT Time Calculation (min) (ACUTE ONLY): 30 min   Charges:   PT Evaluation $PT Eval Low Complexity: 1 Procedure     PT G CodesClaretha Cooper 03/26/2016, 11:18 AM Tresa Endo PT [phone]

## 2016-03-26 NOTE — Progress Notes (Signed)
PROGRESS NOTE  [name]  [id number] DOB: April 11, 1940 DOA: 03/25/2016 PCP: Tivis Ringer, MD  Outpatient Specialists: Dr. Gaynelle Arabian, Urology Dr. Angelena Form, Cardiology  Brief Narrative: [name] is a 76 y.o. male with a history of CAD and chronic atrial fibrillation on pradaxa, diverticulosis, PUD, and bladder cancer diagnosed in 2017 being treated by Dr. Gaynelle Arabian.  He underwent cystoscopy and thulium laser TURP on 03/12/2016, prescribed postprocedure trimethoprim but had significant N/V with this so he stopped after 2 doses. Appetite has been poor and he's had severe fatigue with difficulty ambulating for the last 2 days. He denies fevers, chills, cough, shortness of breath, abd pain, or change in urinary symptoms. He wears a pad at night to catch his urine. He came to the emergency department due to progressive weakness.  In the ED he was afebrile, WBC 18.5 up from 7.8 prior to procedure. Hemoglobin was 10.6. Creatinine was 1.66, approximately at baseline, and UA was positive for copious blood and copious white blood cells. KUB demonstrated moderate stool throughout the colon without obstruction. He was given IV fluids and started on cefepime for UTI. Overnight leukocytosis improved, though all cell lines dropped (hgb 10.6 > 8.8 and Plt 225 > 181). LBBB was noted on ECG on admission and remained the following morning, so cardiology was consulted. Due to large volume hematuria noted on 2/13, urology was also consulted. Evaluations are pending.  Assessment & Plan: Active Problems:   UTI (urinary tract infection)  Weakness: Multifactorial with dehydration from N/V likely from abx intolerance, ABLA due to hematuria, and possibly cardiogenic with new LBBB. - PT evaluation recommending SNF at his current level of functioning.  - Treat conditions as below  Nausea and vomiting: DDx includes drug reaction (trimethoprim), UTI, cholecystitis (GB sludge and wall thickening but without  abd pain or tenderness), gastritis from EtOH, pancreatitis (mildly elevated lipase but wants po), ACS (new LBBB on ECG but no chest pain).   - Treating with cefepime, monitor urine culture and blood cultures. - Judicious use of IVF given lower extremity edema and pleural effusions - Right hydronephrosis on U/S - Lactate negative. I do not believe this is sepsis.   Acute on chronic blood loss anemia: Most likely due to hematuria. No blood in stool/emesis grossly, FOBT negative in ED. Had episode of large volume hematuria in room on rounds today.  - Consulted urology - STAT CBC now and q6h - Type & screen - Iron studies consistent with iron depletion, will give feraheme - B12 was 249 and indices are mildly macrocytic, will start supplementation.  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 Angelena Form. Chest pain free but new LBBB on ECG. - Cardiology consulted. - Troponins cycled and are negative.  - Echocardiogram today - Holding pradaxa as above; not on aspirin - Increased to daily rosuvastatin, monitor for tolerance - Continue metoprolol  Chronic atrial fibrillation, rate controlled:  - Holding pradaxa as above - Continue metoprolol  Bladder cancer s/p resection April 2017: with recent TURP - Per urology, consulted Dr. Alyson Ingles 2/13.   Constipation: Took dulcolax with resultant diarrhea followed by 3-4 days of no BM. KUB w/o obstruction. TSH wnl.  -  Bisacodyl suppositories -  miralax BID -  Senna  Prolonged QTc -  Avoid QT prolonging medications -  K and Mg wnl.  PUD:  - Continue PPI  Chronic kidney disease stage 3/4, creatinine near baseline -  Avoid nephrotoxic agents -  Renally dose medications  EtOH use: Daily use. U/S showed liver nodularity with ascites. Cirrhosis may explain/contribute to macrocytosis, mild hyponatremia.  -  CIWA protocol  DVT prophylaxis: SCD's Code Status:  Full Family Communication: Son at bedside Disposition Plan: Uncertain  Consultants:   Cardiology  Urology  Procedures:   Echo ordered  Antimicrobials:  Cefepime   Subjective: Pt feels better than on arrival. No nausea, wants to advance diet. Has bleeding in urine worse than usual. No abdominal pain or BM.   Objective: Vitals:   03/25/16 1901 03/25/16 1904 03/25/16 2014 03/26/16 0556  BP:  (!) 151/69 (!) 114/52 107/60  Pulse:  78 72 80  Resp:  (!) 21 20 18   Temp:  97.7 F (36.5 C) 98.1 F (36.7 C) 99.1 F (37.3 C)  TempSrc:  Oral Oral Oral  SpO2:  99% 100% 97%  Weight: 82.6 kg (182 lb)     Height: 5\' 8"  (1.727 m)       Intake/Output Summary (Last 24 hours) at 03/26/16 0751 Last data filed at 03/26/16 0600  Gross per 24 hour  Intake             2000 ml  Output              150 ml  Net             1850 ml   Filed Weights   03/25/16 1746 03/25/16 1901  Weight: 82.6 kg (182 lb) 82.6 kg (182 lb)    Examination: General exam: 76 y.o. male in no distress Respiratory system: Non-labored breathing room air. Clear to auscultation bilaterally with diminished sounds at bases Cardiovascular system: Regular rate and rhythm. No murmur, rub, or gallop. No JVD, and 1+ pedal edema. Gastrointestinal system: Abdomen soft, suprapubic tenderness without rebound or guarding. No RUQ tenderness. Non-distended, with normoactive bowel sounds. No organomegaly or masses felt. Central nervous system: Alert and oriented. No focal neurological deficits. Extremities: Warm, no deformities Skin: No rashes, lesions no ulcers Psychiatry: Judgement and insight appear normal. Off, flattened affect.  Data Reviewed: I have personally reviewed following labs and imaging studies  CBC:  Recent Labs Lab 03/25/16 1500 03/26/16 0222  WBC 18.5* 12.1*  NEUTROABS 17.1*  --   HGB 10.6* 8.8*  HCT 31.6* 25.9*  MCV 99.7 101.6*  PLT 225 0000000   Basic Metabolic Panel:  Recent Labs Lab  03/25/16 1500 03/26/16 0222  NA 133* 133*  K 4.0 3.5  CL 93* 98*  CO2 28 27  GLUCOSE 121* 94  BUN 22* 20  CREATININE 1.66* 1.58*  CALCIUM 8.5* 8.0*  MG  --  1.7   GFR: Estimated Creatinine Clearance: 41.7 mL/min (by C-G formula based on SCr of 1.58 mg/dL (H)). Liver Function Tests:  Recent Labs Lab 03/25/16 1500 03/26/16 0222  AST 19 16  ALT 10* 9*  ALKPHOS 70 55  BILITOT 1.3* 0.9  PROT 6.1* 5.0*  ALBUMIN 3.1* 2.5*    Recent Labs Lab 03/25/16 1500 03/26/16 0222  LIPASE 68* 74*   No results for input(s): AMMONIA in the last 168 hours. Coagulation Profile: No results for input(s): INR, PROTIME in the last 168 hours. Cardiac Enzymes:  Recent Labs Lab 03/25/16 2009 03/26/16 0222  TROPONINI <0.03 <0.03   BNP (last 3 results) No results for input(s): PROBNP in the last 8760 hours. HbA1C: No results for input(s): HGBA1C in the last 72 hours. CBG:  Recent Labs Lab 03/25/16 1323  GLUCAP  126*   Lipid Profile: No results for input(s): CHOL, HDL, LDLCALC, TRIG, CHOLHDL, LDLDIRECT in the last 72 hours. Thyroid Function Tests:  Recent Labs  03/26/16 0222  TSH 1.035   Anemia Panel: No results for input(s): VITAMINB12, FOLATE, FERRITIN, TIBC, IRON, RETICCTPCT in the last 72 hours. Urine analysis:    Component Value Date/Time   COLORURINE RED (A) 03/25/2016 1500   APPEARANCEUR TURBID (A) 03/25/2016 1500   LABSPEC  03/25/2016 1500    TEST NOT REPORTED DUE TO COLOR INTERFERENCE OF URINE PIGMENT   PHURINE  03/25/2016 1500    TEST NOT REPORTED DUE TO COLOR INTERFERENCE OF URINE PIGMENT   GLUCOSEU (A) 03/25/2016 1500    TEST NOT REPORTED DUE TO COLOR INTERFERENCE OF URINE PIGMENT   GLUCOSEU NEGATIVE 08/23/2010 0750   HGBUR (A) 03/25/2016 1500    TEST NOT REPORTED DUE TO COLOR INTERFERENCE OF URINE PIGMENT   BILIRUBINUR (A) 03/25/2016 1500    TEST NOT REPORTED DUE TO COLOR INTERFERENCE OF URINE PIGMENT   KETONESUR (A) 03/25/2016 1500    TEST NOT REPORTED  DUE TO COLOR INTERFERENCE OF URINE PIGMENT   PROTEINUR (A) 03/25/2016 1500    TEST NOT REPORTED DUE TO COLOR INTERFERENCE OF URINE PIGMENT   UROBILINOGEN 0.2 05/23/2012 1618   NITRITE (A) 03/25/2016 1500    TEST NOT REPORTED DUE TO COLOR INTERFERENCE OF URINE PIGMENT   LEUKOCYTESUR (A) 03/25/2016 1500    TEST NOT REPORTED DUE TO COLOR INTERFERENCE OF URINE PIGMENT   No results found for this or any previous visit (from the past 240 hour(s)).    Radiology Studies: Dg Abd Acute W/chest  Result Date: 03/25/2016 CLINICAL DATA:  Initial evaluation for acute vomiting, constipation. EXAM: DG ABDOMEN ACUTE W/ 1V CHEST COMPARISON:  Prior CT from 05/10/2015. FINDINGS: Mild cardiomegaly. Mediastinal silhouette within normal limits. Aortic atherosclerosis noted. Lungs normally inflated. Blunting of the bilateral costophrenic angles suggests small pleural effusions. Associated patchy and linear bibasilar opacities, favored to reflect atelectasis. Superimposed infiltrates not excluded. No other focal infiltrates. No pulmonary edema. No pneumothorax. Bowel gas pattern within normal limits without evidence for obstruction or ileus. No free air. No abnormal bowel wall thickening. Moderate amount of retained stool within the colon. No soft tissue mass identified. Scattered vascular calcifications noted within the abdomen. Mild scoliosis with multilevel degenerative spondylolysis noted within the visualized spine. Advanced osteoarthritic changes noted about the hips. No acute osseous abnormality. IMPRESSION: 1. Nonobstructive bowel gas pattern with no radiographic evidence for acute intra-abdominal process. 2. Moderate amount of retained stool within the colon. 3. Small bilateral pleural effusions. Associated bibasilar opacities favored to reflect atelectasis, although possible infiltrates could be considered in the correct clinical setting. 4. Extensive aortic atherosclerosis. Electronically Signed   By: Jeannine Boga M.D.   On: 03/25/2016 14:53    Scheduled Meds: . allopurinol  300 mg Oral Daily  . amLODipine  5 mg Oral Daily  . bisacodyl  10 mg Rectal Once  . ceFEPime (MAXIPIME) IV  2 g Intravenous Q24H  . dabigatran  150 mg Oral BID  . folic acid  1 mg Oral Daily  . LORazepam  0-4 mg Oral Q6H   Followed by  . [START ON 03/27/2016] LORazepam  0-4 mg Oral Q12H  . metoprolol succinate  100 mg Oral Daily  . multivitamin with minerals  1 tablet Oral Daily  . pantoprazole (PROTONIX) IV  40 mg Intravenous BID  . polyethylene glycol  17 g Oral BID  .  rosuvastatin  20 mg Oral Once per day on Mon Thu  . senna  2 tablet Oral QHS  . thiamine  100 mg Oral Daily   Or  . thiamine  100 mg Intravenous Daily   Continuous Infusions:   LOS: 1 day   Time spent: 25 minutes.  Vance Gather, MD [facility] Pager [phone]  If 7PM-7AM, please contact night-coverage [email or web address] Password [facility] 03/26/2016, 7:51 AM

## 2016-03-26 NOTE — Progress Notes (Signed)
Educated patient about need to take Miralax due to not experiencing bowel movement in several days, and experiencing nausea and vomiting in the recent past. Patient verbalized understanding, however continued to refuse Miralax. Patient was able to take throughout the day with encouragement from wife.   BRETTEN SCHNEEBERGER SN 03/26/2016 6:34 PM   Co-sign Elizbeth Squires, RN Clinical instructor

## 2016-03-26 NOTE — Progress Notes (Signed)
Pt went into a junctional rhythm (Lasted around 15s seconds), HR dropped to 38. Pt asleep and asymptomatic.   MD made aware.  Will continue to monitor closely.

## 2016-03-26 NOTE — Consult Note (Addendum)
The patient has been seen in conjunction with [name], in NP-C. All aspects of care have been considered and discussed. The patient has been personally interviewed, examined, and all clinical data has been reviewed.   Chronic atrial fibrillation now with left bundle branch block, new when compared to tracings from April 2017.  The change in conduction pattern is noted in the setting of fatigue, decreased energy, nausea, vomiting, following a urologic procedure. No specific cardiac complaints.  Cardiac markers are negative for evidence of recent infarction.  Agree with 2-D Doppler echocardiogram to assess LV function.  Would also check a BNP.    Cardiology Consult    Patient ID: [name] MRN: [id number], DOB/AGE: Sep 10, 1940   Admit date: 03/25/2016 Date of Consult: 03/26/2016  Primary Physician: Martin Ringer, MD Primary Cardiologist: Dr. Angelena Mcdonald Requesting Provider: Dr. Bonner Mcdonald Reason for Consultation: New LBBB?  Patient Profile    76 yo male with PMH of persistent AF, HTN, CAD s/p MI (balloon angioplasty '80s and PVD who presented with generalized weakness.   Past Medical History   Past Medical History:  Diagnosis Date  . AAA (abdominal aortic aneurysm) ([place]) 02-25-2011   US abdomen 3.4 cm x 3 cm last US done per pt  . Anemia   . CAD (coronary artery disease)   . Cancer [facility])    Bladder cancer  . Chronic atrial fibrillation ([facility])    Dr. Angelena Mcdonald  . Duodenal ulcer   . Dysrhythmia    Atrial fibrillation-Dr. Angelena Mcdonald follows  . Gout   . Heart attack   . Heart murmur   . Hematuria    "bladder cancer"  . Hemorrhoids   . History of blood transfusion    last -5 yrs ago "ulcer'  . History of GI bleed   . Hyperlipidemia   . Hypertension   . Macular degeneration, dry    BILATERAL  . Myocardial infarction    history of. 1983  . Renal insufficiency     Past Surgical History:  Procedure Laterality Date  . CARDIAC CATHETERIZATION     '83  .  CAROTID ENDARTERECTOMY     bilaterally 2007  . CATARACT EXTRACTION Bilateral 2-3 yrs ago  . Cath/angioplasy     [id number], [facility] by Dr. Gilda Mcdonald [facility] of angioplasty)  . COLOSTOMY N/A 04/07/2012   Procedure: COLOSTOMY;  Surgeon: Martin Jolly, MD;  Location: [facility];  Service: General;  Laterality: N/A;  Sigmoid Colectomy Colostomy   . COLOSTOMY TAKEDOWN N/A 09/30/2012   Procedure: TAKEDOWN OF HARTMAN COLOSTOMY;  Surgeon: Martin Jolly, MD;  Location: [facility];  Service: General;  Laterality: N/A;  . CYSTOSCOPY     done in Office -Dr. Gaynelle Mcdonald 05-15-15  . CYSTOSCOPY N/A 07/28/2015   Procedure: CYSTOSCOPY;  Surgeon: Martin Clines, MD;  Location: [facility];  Service: Urology;  Laterality: N/A;  . CYSTOSCOPY W/ URETERAL STENT PLACEMENT Right 05/26/2015   Procedure: CYSTOSCOPY WITH LEFT RETROGRADE PYELOGRAM/URETERAL ;  Surgeon: Martin Clines, MD;  Location: [facility];  Service: Urology;  Laterality: Right;  . ESOPHAGOGASTRODUODENOSCOPY  04/06/2011   Procedure: ESOPHAGOGASTRODUODENOSCOPY (EGD);  Surgeon: Martin Beams, MD;  Location: [facility];  Service: Endoscopy;  Laterality: N/A;  . EYE SURGERY  2011   bilateral  . LAMINECTOMY N/A 05/23/2012   Procedure: LUMBAR four-five LAMINECTOMY ;  Surgeon: Martin Miss, MD;  Location: [facility];  Service: Neurosurgery;  Laterality: N/A;  . No colonoscopy to date     ("I don't have a good  excuse"). SOC reviewed  . PARTIAL COLECTOMY N/A 04/07/2012   Procedure: PARTIAL COLECTOMY;  Surgeon: Martin Jolly, MD;  Location: [facility];  Service: General;  Laterality: N/A;  Sigmoid Colectomy Colostomy hartman procedure  . pyloric stenosis    . TONSILLECTOMY  as child  . tooth extraction  09/24/2013   and dental implant  . TRANSURETHRAL RESECTION OF BLADDER TUMOR N/A 05/26/2015   Procedure: TRANSURETHRAL RESECTION OF BLADDER TUMOR (TURBT);  Surgeon: Martin Clines, MD;  Location: [facility];  Service: Urology;  Laterality: N/A;  .  TRANSURETHRAL RESECTION OF BLADDER TUMOR N/A 07/28/2015   Procedure: TRANSURETHRAL RESECTION OF BLADDER  BIOPSY;  Surgeon: Martin Clines, MD;  Location: [facility];  Service: Urology;  Laterality: N/A;  . TRANSURETHRAL RESECTION OF BLADDER TUMOR N/A 03/12/2016   Procedure: POSSIBLE TRANSURETHRAL RESECTION OF BLADDER TUMOR (TURBT);  Surgeon: Martin Clines, MD;  Location: [facility];  Service: Urology;  Laterality: N/A;     Allergies  Allergies  Allergen Reactions  . Eggs Or Egg-Derived Products Nausea And Vomiting    History of Present Illness    [name] is a 76 yo male with PMH of persistent AF, HTN, CAD s/p MI (balloon angioplasty '80s and PVD. Carotid artery disease followed in [facility] with stable carotid dopplers January 2016. He was admitted to the GI service on 04/05/2011 with an acute upper GI bleed. He underwent upper endoscopy with [name] the following morning and was found to have multiple shallow clean-based duodenal ulcers felt to be NSAID-induced. Developed leg weakness in 4/14 and found to have synovial cyst causing spinal stenosis, underwent laminotomy. Also diagnosed with bladder CA in '17 and had a resection of bladder tumor in 4/17. He was last seen in the office on 12/17 by Dr. Angelena Mcdonald and reported being in his usual state of health. No reported angina symptoms, and was continued on his medications without change (BB, statin, Pradaxa).   Last Echo 12/09 showed normal EF with mild MR, mildly dilated LA/RA and RV.   Seen by Urology and underwent TURP with cystoscopy and thulium laser on 03/12/16. Was suppose to taketrimethoprim postprocedure but this made him nauseated, and he only took it for 2 days. Since procedure he has had generalized weakness with intermittent constipation. Also reported anorexia with dark down emesis for several days. Presented to the ED with generalized weakness on 2/12.  Labs showed Na+ 133, Cr 1.66, Albumin 3.1. Trop neg x3, Hgb 10.6>> 8.8, WBC  18.5>>12.1. UA positive for copious blood and WBCs. KUB with moderate stool in colon, but no obstruction. EKg showed AF rate controlled with new LBBB. He was admitted for IV hydration, and antibiotics.   Inpatient Medications    . allopurinol  300 mg Oral Daily  . amLODipine  5 mg Oral Daily  . bisacodyl  10 mg Rectal Once  . ceFEPime (MAXIPIME) IV  2 g Intravenous Q24H  . dabigatran  150 mg Oral BID  . folic acid  1 mg Oral Daily  . LORazepam  0-4 mg Oral Q6H   Followed by  . [START ON 03/27/2016] LORazepam  0-4 mg Oral Q12H  . metoprolol succinate  100 mg Oral Daily  . multivitamin with minerals  1 tablet Oral Daily  . pantoprazole (PROTONIX) IV  40 mg Intravenous BID  . polyethylene glycol  17 g Oral BID  . rosuvastatin  20 mg Oral Once per day on Mon Thu  . senna  2 tablet Oral QHS  . thiamine  100 mg Oral Daily   Or  . thiamine  100 mg Intravenous Daily    Family History    Family History  Problem Relation Age of Onset  . Heart attack Father   . Heart disease Father     After age 43  . Cancer Father   . Cancer Mother   . COPD Mother   . Emphysema Mother   . Obesity Sister   . Alcohol abuse Brother   . Colon cancer Neg Hx     Social History    Social History   Social History  . Marital status: Married    Spouse name: N/A  . Number of children: 3  . Years of education: N/A   Occupational History  . Stockbroker    Social History Main Topics  . Smoking status: Former Smoker    Packs/day: 4.00    Years: 25.00    Types: Cigarettes    Quit date: 02/11/1981  . Smokeless tobacco: Never Used  . Alcohol use 12.6 oz/week    21 Shots of liquor per week     Comment: daily 2-3 per day  . Drug use: No  . Sexual activity: Not Currently   Other Topics Concern  . Not on file   Social History Narrative  . No narrative on file     Review of Systems    General:  No chills, fever, night sweats or weight changes.  Cardiovascular:  No chest pain, dyspnea on  exertion, edema, orthopnea, palpitations, paroxysmal nocturnal dyspnea. Dermatological: No rash, lesions/masses Respiratory: No cough, dyspnea Urologic: See HPI Abdominal:   See HPI Neurologic:  No visual changes, wkns, changes in mental status. All other systems reviewed and are otherwise negative except as noted above.  Physical Exam    Blood pressure 107/60, pulse 80, temperature 99.1 F (37.3 C), temperature source Oral, resp. rate 18, height 5\' 8"  (1.727 m), weight 182 lb (82.6 kg), SpO2 97 %.  General: Pleasant older WM, NAD Psych: Normal affect. Neuro: Alert and oriented X 3. Moves all extremities spontaneously. HEENT: Normal  Neck: Supple without bruits or JVD. Lungs:  Resp regular and unlabored, CTA. Heart: Irreg Irreg, no s3, s4, or murmurs. Abdomen: Soft, non-tender, non-distended, BS + x 4.  Extremities: No clubbing, cyanosis, trace bilateral LE edema. DP/PT/Radials 2+ and equal bilaterally.  Labs    Troponin [facility] of Care Test)  Recent Labs  03/25/16 1509  TROPIPOC 0.00    Recent Labs  03/25/16 2009 03/26/16 0222  TROPONINI <0.03 <0.03   Lab Results  Component Value Date   WBC 12.1 (H) 03/26/2016   HGB 8.8 (L) 03/26/2016   HCT 25.9 (L) 03/26/2016   MCV 101.6 (H) 03/26/2016   PLT 181 03/26/2016    Recent Labs Lab 03/26/16 0222  NA 133*  K 3.5  CL 98*  CO2 27  BUN 20  CREATININE 1.58*  CALCIUM 8.0*  PROT 5.0*  BILITOT 0.9  ALKPHOS 55  ALT 9*  AST 16  GLUCOSE 94   Lab Results  Component Value Date   CHOL 134 04/16/2011   HDL 53.00 04/16/2011   LDLCALC 64 04/16/2011   TRIG 83.0 04/16/2011   No results found for: [facility]   Radiology Studies    Dg Abd Acute W/chest  Result Date: 03/25/2016 CLINICAL DATA:  Initial evaluation for acute vomiting, constipation. EXAM: DG ABDOMEN ACUTE W/ 1V CHEST COMPARISON:  Prior CT from 05/10/2015. FINDINGS: Mild cardiomegaly. Mediastinal silhouette within normal limits. Aortic atherosclerosis  noted.  Lungs normally inflated. Blunting of the bilateral costophrenic angles suggests small pleural effusions. Associated patchy and linear bibasilar opacities, favored to reflect atelectasis. Superimposed infiltrates not excluded. No other focal infiltrates. No pulmonary edema. No pneumothorax. Bowel gas pattern within normal limits without evidence for obstruction or ileus. No free air. No abnormal bowel wall thickening. Moderate amount of retained stool within the colon. No soft tissue mass identified. Scattered vascular calcifications noted within the abdomen. Mild scoliosis with multilevel degenerative spondylolysis noted within the visualized spine. Advanced osteoarthritic changes noted about the hips. No acute osseous abnormality. IMPRESSION: 1. Nonobstructive bowel gas pattern with no radiographic evidence for acute intra-abdominal process. 2. Moderate amount of retained stool within the colon. 3. Small bilateral pleural effusions. Associated bibasilar opacities favored to reflect atelectasis, although possible infiltrates could be considered in the correct clinical setting. 4. Extensive aortic atherosclerosis. Electronically Signed   By: Jeannine Boga M.D.   On: 03/25/2016 14:53    ECG & Cardiac Imaging    EKG: AF with new LBBB  Echo: 12/09  SUMMARY - Overall left ventricular systolic function was normal. Left    ventricular ejection fraction was estimated to be 60 %. - The aortic valve was mildly calcified. There was trivial aortic    valvular regurgitation. - There was mild mitral valvular regurgitation. The effective    orifice of mitral regurgitation by proximal isovelocity    surface area was 0.04 cm^2. The volume of mitral    regurgitation by proximal isovelocity surface area was 5 cc. - The left atrium was mildly dilated. - The right ventricle was mildly dilated. - The right atrium was mildly dilated.  Assessment & Plan    76 yo male with PMH  of persistent AF, HTN, CAD s/p MI (balloon angioplasty '80s and PVD who presented with generalized weakness.  1. AF with new LBBB: Reports last cardiac intervention he has was back in the 80s and has done well since that time. Very active at home and cares for himself, does not have any exertional angina. EKG this admission shows new LBBB, AF rate controlled. Currently on Pradaxa for [place]. TSH normal. -- trop neg x3 -- agree with checking echo at this time for WMA.   2. Generalized weakness with n/v: Reports he has not felt back to baseline since having TURP done back in 1/18. Has had ongoing hematuria since that time, along with constipation, nausea and vomiting over the past couple of days. Likely 2/2 to other acute illness.  -- undergoing abd [place]  3. UTI/Hematuria: Noted on admission, antibiotics per primary. Urology consult? As he reports this hematuria has been present since his TURP.   4. Anemia: Hgb drop 12.5 pre TURP, 10.6>>8.8 this morning. FOBT negative. Iron 22.   5. CKD III: Cr improved today with IVF.   Barnet Pall, NP-C Pager [phone] 03/26/2016, 8:49 AM

## 2016-03-26 NOTE — Progress Notes (Signed)
Initial Nutrition Assessment  INTERVENTION:   Encourage PO intake RD to continue to monitor for any nutrition needs  NUTRITION DIAGNOSIS:   Increased nutrient needs related to cancer and cancer related treatments as evidenced by estimated needs.  GOAL:   Patient will meet greater than or equal to 90% of their needs  MONITOR:   PO intake, Labs, Weight trends, I & O's  REASON FOR ASSESSMENT:   Malnutrition Screening Tool    ASSESSMENT:   76 y.o. male with a history of CAD and chronic atrial fibrillation on pradaxa, diverticulosis, PUD, and bladder cancer diagnosed in 2017 being treated by Dr. Gaynelle Arabian.  He underwent cystoscopy and thulium laser TURP on 03/12/2016, prescribed postprocedure trimethoprim but had significant N/V with this so he stopped after 2 doses. Appetite has been poor and he's had severe fatigue with difficulty ambulating for the last 2 days.  Patient in room with wife at bedside. Pt sleeping and wife did not want him disturbed at this time. Pt's wife reports pt with poor appetite since a bladder procedure on 1/30. Pt developed vomiting at night and decreased appetite. Pt continues to not eat well on full liquid diet, drinking only coffee this morning. Pt not interested in protein supplements. Pt's wife states she will continue to encouraged him to eat. Pt has also been having constipation issues with his last BM being 4 days ago.   Pt's wife reports pt's UBW is ~190 lb. Pt has lost 9 lb since June 2017 (5% wt loss x 8 months, insignificant for time frame). Unable to perform NFPE at this time.   Medications: Dulcolax suppository once, Folic acid tablet daily, Multivitamin with minerals daily, IV Protonix BID, Miralax packet BID, senokot tablet daily, Thiamine tablet daily Labs reviewed: Low Na Mg WNL GFR: 41  Diet Order:  Diet full liquid Room service appropriate? Yes; Fluid consistency: Thin  Skin:  Reviewed, no issues  Last BM:  2/9  Height:   Ht  Readings from Last 1 Encounters:  03/25/16 5\' 8"  (1.727 m)    Weight:   Wt Readings from Last 1 Encounters:  03/25/16 182 lb (82.6 kg)    Ideal Body Weight:  70 kg  BMI:  Body mass index is 27.67 kg/m.  Estimated Nutritional Needs:   Kcal:  2100-2300  Protein:  100-110g  Fluid:  2.1-2.3L/day  EDUCATION NEEDS:   No education needs identified at this time  Clayton Bibles, MS, RD, LDN Pager: [phone] After Hours Pager: [phone]

## 2016-03-27 ENCOUNTER — Encounter (HOSPITAL_COMMUNITY): Payer: Self-pay

## 2016-03-27 DIAGNOSIS — I482 Chronic atrial fibrillation: Secondary | ICD-10-CM

## 2016-03-27 LAB — COMPREHENSIVE METABOLIC PANEL
ALK PHOS: 60 U/L (ref 38–126)
ALT: 9 U/L — ABNORMAL LOW (ref 17–63)
AST: 18 U/L (ref 15–41)
Albumin: 2.6 g/dL — ABNORMAL LOW (ref 3.5–5.0)
Anion gap: 9 (ref 5–15)
BILIRUBIN TOTAL: 1.5 mg/dL — AB (ref 0.3–1.2)
BUN: 20 mg/dL (ref 6–20)
CALCIUM: 8.3 mg/dL — AB (ref 8.9–10.3)
CO2: 28 mmol/L (ref 22–32)
Chloride: 99 mmol/L — ABNORMAL LOW (ref 101–111)
Creatinine, Ser: 1.53 mg/dL — ABNORMAL HIGH (ref 0.61–1.24)
GFR calc Af Amer: 49 mL/min — ABNORMAL LOW (ref >60)
GFR calc non Af Amer: 42 mL/min — ABNORMAL LOW (ref >60)
Glucose, Bld: 100 mg/dL — ABNORMAL HIGH (ref 65–99)
POTASSIUM: 3.3 mmol/L — AB (ref 3.5–5.1)
Sodium: 136 mmol/L (ref 135–145)
TOTAL PROTEIN: 5.2 g/dL — AB (ref 6.5–8.1)

## 2016-03-27 LAB — CBC WITH DIFFERENTIAL/PLATELET
BASOS PCT: 0 %
Basophils Absolute: 0 10*3/uL (ref 0.0–0.1)
Eosinophils Absolute: 0.1 10*3/uL (ref 0.0–0.7)
Eosinophils Relative: 1 %
HEMATOCRIT: 29.5 % — AB (ref 39.0–52.0)
Hemoglobin: 10 g/dL — ABNORMAL LOW (ref 13.0–17.0)
LYMPHS ABS: 1 10*3/uL (ref 0.7–4.0)
Lymphocytes Relative: 10 %
MCH: 33.8 pg (ref 26.0–34.0)
MCHC: 33.9 g/dL (ref 30.0–36.0)
MCV: 99.7 fL (ref 78.0–100.0)
MONO ABS: 0.8 10*3/uL (ref 0.1–1.0)
MONOS PCT: 8 %
NEUTROS ABS: 7.8 10*3/uL — AB (ref 1.7–7.7)
Neutrophils Relative %: 81 %
Platelets: 175 10*3/uL (ref 150–400)
RBC: 2.96 MIL/uL — ABNORMAL LOW (ref 4.22–5.81)
RDW: 17.4 % — AB (ref 11.5–15.5)
WBC: 9.6 10*3/uL (ref 4.0–10.5)

## 2016-03-27 LAB — TYPE AND SCREEN
BLOOD PRODUCT EXPIRATION DATE: 201803082359
ISSUE DATE / TIME: 201802131613
Unit Type and Rh: 5100

## 2016-03-27 LAB — MAGNESIUM: Magnesium: 2.1 mg/dL (ref 1.7–2.4)

## 2016-03-27 MED ORDER — PANTOPRAZOLE SODIUM 40 MG PO TBEC
40.0000 mg | DELAYED_RELEASE_TABLET | Freq: Two times a day (BID) | ORAL | Status: DC
  Administered 2016-03-27 – 2016-03-28 (×3): 40 mg via ORAL
  Filled 2016-03-27 (×3): qty 1

## 2016-03-27 MED ORDER — SORBITOL 70 % SOLN
20.0000 mL | Freq: Once | Status: AC
  Administered 2016-03-27: 20 mL via ORAL
  Filled 2016-03-27: qty 30

## 2016-03-27 MED ORDER — POTASSIUM CHLORIDE CRYS ER 20 MEQ PO TBCR
40.0000 meq | EXTENDED_RELEASE_TABLET | Freq: Every day | ORAL | Status: DC
  Administered 2016-03-27: 40 meq via ORAL
  Administered 2016-03-28: 20 meq via ORAL
  Filled 2016-03-27 (×2): qty 2

## 2016-03-27 NOTE — Consult Note (Signed)
Urology Consult  Referring physician: Dr. Verlon Au Reason for referral: gross hematuria  Chief Complaint: fatigue and gross hematuria  History of Present Illness: Mr [name] is a 76yo with a h xof BPH and High grade bladder cancer whi has had several months of intermittent gross hematuria. He underwent Thilium laser vaporization of the prostate 2 weeks ago and since the procedure his gross hematuria has only marginally improved. 3 days ago he developed nausea, diarrhea, poor appetite and at that time his hematuria worsened. He denies any significant LUTS. NO suprapubic pain. He denies a feeling of incomplete emptying. He was recently treated for a UTI but stopped the antibiotic 5 days ago. His UA showed RBCs and WBCs, no bacteria. Urine culture currently negative.  Past Medical History:  Diagnosis Date  . AAA (abdominal aortic aneurysm) ([place]) 02-25-2011   US abdomen 3.4 cm x 3 cm last US done per pt  . Anemia   . CAD (coronary artery disease)   . Cancer [facility])    Bladder cancer  . Chronic atrial fibrillation (HCC)    Dr. Angelena Form  . Duodenal ulcer   . Dysrhythmia    Atrial fibrillation-Dr. Angelena Form follows  . Gout   . Heart attack   . Heart murmur   . Hematuria    "bladder cancer"  . Hemorrhoids   . History of blood transfusion    last -5 yrs ago "ulcer'  . History of GI bleed   . Hyperlipidemia   . Hypertension   . Macular degeneration, dry    BILATERAL  . Myocardial infarction    history of. 1983  . Renal insufficiency    Past Surgical History:  Procedure Laterality Date  . CARDIAC CATHETERIZATION     '83  . CAROTID ENDARTERECTOMY     bilaterally 2007  . CATARACT EXTRACTION Bilateral 2-3 yrs ago  . Cath/angioplasy     [id number], [facility] by Dr. Gilda Crease [facility] of angioplasty)  . COLOSTOMY N/A 04/07/2012   Procedure: COLOSTOMY;  Surgeon: Edward Jolly, MD;  Location: [facility];  Service: General;  Laterality: N/A;  Sigmoid Colectomy Colostomy   . COLOSTOMY TAKEDOWN  N/A 09/30/2012   Procedure: TAKEDOWN OF HARTMAN COLOSTOMY;  Surgeon: Edward Jolly, MD;  Location: [facility];  Service: General;  Laterality: N/A;  . CYSTOSCOPY     done in Office -Dr. Gaynelle Arabian 05-15-15  . CYSTOSCOPY N/A 07/28/2015   Procedure: CYSTOSCOPY;  Surgeon: [name], MD;  [facility];  Service: Urology;  Laterality: N/A;  . CYSTOSCOPY W/ URETERAL STENT PLACEMENT Right 05/26/2015   Procedure: CYSTOSCOPY WITH LEFT RETROGRADE PYELOGRAM/URETERAL ;  Surgeon: [name], MD;  Location: [facility];  Service: Urology;  Laterality: Right;  . ESOPHAGOGASTRODUODENOSCOPY  04/06/2011   Procedure: ESOPHAGOGASTRODUODENOSCOPY (EGD);  Surgeon: Beryle Beams, MD;  Location: [facility];  Service: Endoscopy;  Laterality: N/A;  . EYE SURGERY  2011   bilateral  . LAMINECTOMY N/A 05/23/2012   Procedure: LUMBAR four-five LAMINECTOMY ;  Surgeon: Kristeen Miss, MD;  Location: [facility];  Service: Neurosurgery;  Laterality: N/A;  . No colonoscopy to date     ("I don't have a good excuse"). SOC reviewed  . PARTIAL COLECTOMY N/A 04/07/2012   Procedure: PARTIAL COLECTOMY;  Surgeon: Edward Jolly, MD;  [facility];  Service: General;  Laterality: N/A;  Sigmoid Colectomy Colostomy hartman procedure  . pyloric stenosis    . TONSILLECTOMY  as child  . tooth extraction  09/24/2013   and dental implant  .  TRANSURETHRAL RESECTION OF BLADDER TUMOR N/A 05/26/2015   Procedure: TRANSURETHRAL RESECTION OF BLADDER TUMOR (TURBT);  Surgeon: [name], MD;  [facility];  Service: Urology;  Laterality: N/A;  . TRANSURETHRAL RESECTION OF BLADDER TUMOR N/A 07/28/2015   Procedure: TRANSURETHRAL RESECTION OF BLADDER  BIOPSY;  Surgeon: [name], MD;  [facility];  Service: Urology;  Laterality: N/A;  . TRANSURETHRAL RESECTION OF BLADDER TUMOR N/A 03/12/2016   Procedure: POSSIBLE TRANSURETHRAL RESECTION OF BLADDER TUMOR (TURBT);  Surgeon: [name], MD;  [facility];  Service: Urology;  Laterality: N/A;    Medications: I have reviewed the patient's current medications. Allergies:  Allergies  Allergen Reactions  . Eggs Or Egg-Derived Products Nausea And Vomiting    Family History  Problem Relation Age of Onset  . Heart attack Father   . Heart disease Father     After age 55  . Cancer Father   . Cancer Mother   . COPD Mother   . Emphysema Mother   . Obesity Sister   . Alcohol abuse Brother   . Colon cancer Neg Hx    Social History:  reports that he quit smoking about 35 years ago. His smoking use included Cigarettes. He has a 100.00 pack-year smoking history. He has never used smokeless tobacco. He reports that he drinks about 12.6 oz of alcohol per week . He reports that he does not use drugs.  Review of Systems  Constitutional: Positive for malaise/fatigue.  Gastrointestinal: Positive for diarrhea.  Genitourinary: Positive for hematuria.  Neurological: Positive for weakness.  All other systems reviewed and are negative.   Physical Exam:  Vital signs in last 24 hours: Temp:  [97.4 F (36.3 C)-98.8 F (37.1 C)] 98.3 F (36.8 C) (02/13 2133) Pulse Rate:  [63-82] 82 (02/13 2133) Resp:  [18-20] 18 (02/13 2133) BP: (97-121)/(52-67) 118/54 (02/13 2133) SpO2:  [93 %-99 %] 93 % (02/13 2133) Physical Exam  Constitutional: He is oriented to person, place, and time. He appears well-developed and well-nourished.  HENT:  Head: Normocephalic and atraumatic.  Eyes: EOM are normal. Pupils are equal, round, and reactive to light.  Neck: Normal range of motion. No thyromegaly present.  Cardiovascular: Normal rate and regular rhythm.   Respiratory: Effort normal. No respiratory distress.  GI: Soft. He exhibits no distension.  Musculoskeletal: Normal range of motion. He exhibits no edema.  Neurological: He is alert and oriented to person, place, and time.  Skin: Skin is warm and dry.  Psychiatric: He has a normal mood and affect. His  behavior is normal. Judgment and thought content normal.    Laboratory Data:  Results for orders placed or performed during the hospital encounter of 03/25/16 (from the past 72 hour(s))  CBG monitoring, ED     Status: Abnormal   Collection Time: 03/25/16  1:23 PM  Result Value Ref Range   Glucose-Capillary 126 (H) 65 - 99 mg/dL  POC occult blood, ED Provider will collect     Status: None   Collection Time: 03/25/16  2:36 PM  Result Value Ref Range   Fecal Occult Bld NEGATIVE NEGATIVE  CBC with Differential/Platelet     Status: Abnormal   Collection Time: 03/25/16  3:00 PM  Result Value Ref Range   WBC 18.5 (H) 4.0 - 10.5 K/uL   RBC 3.17 (L) 4.22 - 5.81 MIL/uL   Hemoglobin 10.6 (L) 13.0 - 17.0 g/dL   HCT 31.6 (L) 39.0 - 52.0 %   MCV 99.7 78.0 -  100.0 fL   MCH 33.4 26.0 - 34.0 pg   MCHC 33.5 30.0 - 36.0 g/dL   RDW 15.5 11.5 - 15.5 %   Platelets 225 150 - 400 K/uL   Neutrophils Relative % 92 %   Neutro Abs 17.1 (H) 1.7 - 7.7 K/uL   Lymphocytes Relative 4 %   Lymphs Abs 0.6 (L) 0.7 - 4.0 K/uL   Monocytes Relative 4 %   Monocytes Absolute 0.7 0.1 - 1.0 K/uL   Eosinophils Relative 0 %   Eosinophils Absolute 0.0 0.0 - 0.7 K/uL   Basophils Relative 0 %   Basophils Absolute 0.0 0.0 - 0.1 K/uL  Lipase, blood     Status: Abnormal   Collection Time: 03/25/16  3:00 PM  Result Value Ref Range   Lipase 68 (H) 11 - 51 U/L  Urinalysis, Routine w reflex microscopic     Status: Abnormal   Collection Time: 03/25/16  3:00 PM  Result Value Ref Range   Color, Urine RED (A) YELLOW    Comment: BIOCHEMICALS MAY BE AFFECTED BY COLOR   APPearance TURBID (A) CLEAR   Specific Gravity, Urine  1.005 - 1.030    TEST NOT REPORTED DUE TO COLOR INTERFERENCE OF URINE PIGMENT   pH  5.0 - 8.0    TEST NOT REPORTED DUE TO COLOR INTERFERENCE OF URINE PIGMENT   Glucose, UA (A) NEGATIVE mg/dL    TEST NOT REPORTED DUE TO COLOR INTERFERENCE OF URINE PIGMENT   Hgb urine dipstick (A) NEGATIVE    TEST NOT  REPORTED DUE TO COLOR INTERFERENCE OF URINE PIGMENT   Bilirubin Urine (A) NEGATIVE    TEST NOT REPORTED DUE TO COLOR INTERFERENCE OF URINE PIGMENT   Ketones, ur (A) NEGATIVE mg/dL    TEST NOT REPORTED DUE TO COLOR INTERFERENCE OF URINE PIGMENT   Protein, ur (A) NEGATIVE mg/dL    TEST NOT REPORTED DUE TO COLOR INTERFERENCE OF URINE PIGMENT   Nitrite (A) NEGATIVE    TEST NOT REPORTED DUE TO COLOR INTERFERENCE OF URINE PIGMENT   Leukocytes, UA (A) NEGATIVE    TEST NOT REPORTED DUE TO COLOR INTERFERENCE OF URINE PIGMENT  Comprehensive metabolic panel     Status: Abnormal   Collection Time: 03/25/16  3:00 PM  Result Value Ref Range   Sodium 133 (L) 135 - 145 mmol/L   Potassium 4.0 3.5 - 5.1 mmol/L   Chloride 93 (L) 101 - 111 mmol/L   CO2 28 22 - 32 mmol/L   Glucose, Bld 121 (H) 65 - 99 mg/dL   BUN 22 (H) 6 - 20 mg/dL   Creatinine, Ser 1.66 (H) 0.61 - 1.24 mg/dL   Calcium 8.5 (L) 8.9 - 10.3 mg/dL   Total Protein 6.1 (L) 6.5 - 8.1 g/dL   Albumin 3.1 (L) 3.5 - 5.0 g/dL   AST 19 15 - 41 U/L   ALT 10 (L) 17 - 63 U/L   Alkaline Phosphatase 70 38 - 126 U/L   Total Bilirubin 1.3 (H) 0.3 - 1.2 mg/dL   GFR calc non Af Amer 38 (L) >60 mL/min   GFR calc Af Amer 45 (L) >60 mL/min    Comment: (NOTE) The eGFR has been calculated using the CKD EPI equation. This calculation has not been validated in all clinical situations. eGFR's persistently <60 mL/min signify possible Chronic Kidney Disease.    Anion gap 12 5 - 15  Urinalysis, Microscopic (reflex)     Status: None   Collection Time: 03/25/16  3:00 PM  Result Value Ref Range   RBC / HPF TOO NUMEROUS TO COUNT 0 - 5 RBC/hpf   WBC, UA TOO NUMEROUS TO COUNT 0 - 5 WBC/hpf   Bacteria, UA NONE SEEN NONE SEEN   Squamous Epithelial / LPF NONE SEEN NONE SEEN   Mucous PRESENT   I-stat troponin, ED     Status: None   Collection Time: 03/25/16  3:09 PM  Result Value Ref Range   Troponin i, poc 0.00 0.00 - 0.08 ng/mL   Comment 3            Comment:  Due to the release kinetics of cTnI, a negative result within the first hours of the onset of symptoms does not rule out myocardial infarction with certainty. If myocardial infarction is still suspected, repeat the test at appropriate intervals.   I-Stat CG4 Lactic Acid, ED  (not at  [facility])     Status: None   Collection Time: 03/25/16  5:53 PM  Result Value Ref Range   Lactic Acid, Venous 1.03 0.5 - 1.9 mmol/L  Blood Culture (routine x 2)     Status: None (Preliminary result)   Collection Time: 03/25/16  8:02 PM  Result Value Ref Range   Specimen Description BLOOD RIGHT ARM    Special Requests AEROBIC BOTTLE ONLY 5CC    Culture      NO GROWTH < 24 HOURS Performed at [facility], [place] [address], [place], [place] [address]    Report Status PENDING   Troponin I (q 6hr x 3)     Status: None   Collection Time: 03/25/16  8:09 PM  Result Value Ref Range   Troponin I <0.03 <0.03 ng/mL  CBC     Status: Abnormal   Collection Time: 03/26/16  2:22 AM  Result Value Ref Range   WBC 12.1 (H) 4.0 - 10.5 K/uL   RBC 2.55 (L) 4.22 - 5.81 MIL/uL   Hemoglobin 8.8 (L) 13.0 - 17.0 g/dL   HCT 25.9 (L) 39.0 - 52.0 %   MCV 101.6 (H) 78.0 - 100.0 fL   MCH 34.5 (H) 26.0 - 34.0 pg   MCHC 34.0 30.0 - 36.0 g/dL   RDW 15.9 (H) 11.5 - 15.5 %   Platelets 181 150 - 400 K/uL  Troponin I (q 6hr x 3)     Status: None   Collection Time: 03/26/16  2:22 AM  Result Value Ref Range   Troponin I <0.03 <0.03 ng/mL  Comprehensive metabolic panel     Status: Abnormal   Collection Time: 03/26/16  2:22 AM  Result Value Ref Range   Sodium 133 (L) 135 - 145 mmol/L   Potassium 3.5 3.5 - 5.1 mmol/L   Chloride 98 (L) 101 - 111 mmol/L   CO2 27 22 - 32 mmol/L   Glucose, Bld 94 65 - 99 mg/dL   BUN 20 6 - 20 mg/dL   Creatinine, Ser 1.58 (H) 0.61 - 1.24 mg/dL   Calcium 8.0 (L) 8.9 - 10.3 mg/dL   Total Protein 5.0 (L) 6.5 - 8.1 g/dL   Albumin 2.5 (L) 3.5 - 5.0 g/dL   AST 16 15 - 41 U/L   ALT 9 (L) 17 - 63 U/L    Alkaline Phosphatase 55 38 - 126 U/L   Total Bilirubin 0.9 0.3 - 1.2 mg/dL   GFR calc non Af Amer 41 (L) >60 mL/min   GFR calc Af Amer 47 (L) >60 mL/min  Comment: (NOTE) The eGFR has been calculated using the CKD EPI equation. This calculation has not been validated in all clinical situations. eGFR's persistently <60 mL/min signify possible Chronic Kidney Disease.    Anion gap 8 5 - 15  Lipase, blood     Status: Abnormal   Collection Time: 03/26/16  2:22 AM  Result Value Ref Range   Lipase 74 (H) 11 - 51 U/L  Ferritin     Status: None   Collection Time: 03/26/16  2:22 AM  Result Value Ref Range   Ferritin 94 24 - 336 ng/mL    Comment: Performed at [facility], [place] [address] [place], [place] [address]  Folate     Status: None   Collection Time: 03/26/16  2:22 AM  Result Value Ref Range   Folate 7.0 >5.9 ng/mL    Comment: Performed at [facility], [place] [address], [place], [place] [address]  Iron and TIBC     Status: Abnormal   Collection Time: 03/26/16  2:22 AM  Result Value Ref Range   Iron 22 (L) 45 - 182 ug/dL   TIBC 238 (L) 250 - 450 ug/dL   Saturation Ratios 9 (L) 17.9 - 39.5 %   UIBC 216 ug/dL    Comment: Performed at [facility], [place] [address], [place], [place] [address]  TSH     Status: None   Collection Time: 03/26/16  2:22 AM  Result Value Ref Range   TSH 1.035 0.350 - 4.500 uIU/mL    Comment: Performed by a 3rd Generation assay with a functional sensitivity of <=0.01 uIU/mL.  Vitamin B12     Status: None   Collection Time: 03/26/16  2:22 AM  Result Value Ref Range   Vitamin B-12 249 180 - 914 pg/mL    Comment: (NOTE) This assay is not validated for testing neonatal or myeloproliferative syndrome specimens for Vitamin B12 levels. Performed at [facility], [place] [address] [place], [place] [address]   Magnesium     Status: None   Collection Time: 03/26/16  2:22 AM  Result Value Ref Range   Magnesium 1.7 1.7 - 2.4 mg/dL   Troponin I (q 6hr x 3)     Status: None   Collection Time: 03/26/16  8:02 AM  Result Value Ref Range   Troponin I <0.03 <0.03 ng/mL  CBC     Status: Abnormal   Collection Time: 03/26/16  8:02 AM  Result Value Ref Range   WBC 11.2 (H) 4.0 - 10.5 K/uL   RBC 2.52 (L) 4.22 - 5.81 MIL/uL   Hemoglobin 8.4 (L) 13.0 - 17.0 g/dL   HCT 25.4 (L) 39.0 - 52.0 %   MCV 100.8 (H) 78.0 - 100.0 fL   MCH 33.3 26.0 - 34.0 pg   MCHC 33.1 30.0 - 36.0 g/dL   RDW 15.7 (H) 11.5 - 15.5 %   Platelets 179 150 - 400 K/uL  Type and screen [place]     Status: None (Preliminary result)   Collection Time: 03/26/16  2:35 PM  Result Value Ref Range   ABO/RH(D) O POS    Antibody Screen NEG    Sample Expiration 03/29/2016    Unit Number [id number]    Blood Component Type RED CELLS,LR    Unit division 00    Status of Unit ISSUED    Transfusion Status OK TO TRANSFUSE    Crossmatch Result Compatible   CBC  Status: Abnormal   Collection Time: 03/26/16  2:47 PM  Result Value Ref Range   WBC 12.7 (H) 4.0 - 10.5 K/uL   RBC 2.48 (L) 4.22 - 5.81 MIL/uL   Hemoglobin 8.3 (L) 13.0 - 17.0 g/dL   HCT 25.0 (L) 39.0 - 52.0 %   MCV 100.8 (H) 78.0 - 100.0 fL   MCH 33.5 26.0 - 34.0 pg   MCHC 33.2 30.0 - 36.0 g/dL   RDW 15.7 (H) 11.5 - 15.5 %   Platelets 176 150 - 400 K/uL  Prepare RBC     Status: None   Collection Time: 03/26/16  4:00 PM  Result Value Ref Range   Order Confirmation ORDER PROCESSED BY BLOOD BANK   Brain natriuretic peptide     Status: Abnormal   Collection Time: 03/26/16  4:27 PM  Result Value Ref Range   B Natriuretic Peptide 425.1 (H) 0.0 - 100.0 pg/mL   Recent Results (from the past 240 hour(s))  Blood Culture (routine x 2)     Status: None (Preliminary result)   Collection Time: 03/25/16  8:02 PM  Result Value Ref Range Status   Specimen Description BLOOD RIGHT ARM  Final   Special Requests AEROBIC BOTTLE ONLY 5CC  Final   Culture   Final    NO GROWTH < 24  HOURS Performed at [facility], [address], [place], [place] [address]    Report Status PENDING  Incomplete   Creatinine:  Recent Labs  03/25/16 1500 03/26/16 0222  CREATININE 1.66* 1.58*   Baseline Creatinine: 1.6  Impression/Assessment:  75yo with BPH and refractory gross hematuria  Plan:  1. Gross hematuria: I discussed the various causes of painless gross hematuria with the patient and his son-in-law. The hematuria is likely unrelated to infection so I discussed stopping antibiotics and the patient and son-in-law agree. I discussed medical therapy including 5ARI therapy and the patient would like to proceed with this therapy. If his hematuria continues to worsen we discussed cystosocpy and fulgeration in the OR. We will start finasteride 23m daily tonight.  PNicolette Bang2/14/2018, 6:07 AM

## 2016-03-27 NOTE — Care Management Note (Signed)
Case Management Note  Patient Details  Name: [name] MRN: [id number] Date of Birth: [birth year]  Subjective/Objective:  Spouse chose-[facility] for HHPT-rep Maudie Mercury aware & following for HHPT, f16f orders.                  Action/Plan:dc plan home w/HHC.   Expected Discharge Date:   (unknown)               Expected Discharge Plan:  [place]  In-House Referral:     Discharge planning Services  CM Consult  Post Acute Care Choice:    Choice offered to:  Spouse  DME Arranged:    DME Agency:     HH Arranged:  PT [place]:  [place]  Status of Service:  In process, will continue to follow  If discussed at Long Length of Stay Meetings, dates discussed:    Additional Comments:  Dessa Phi, RN 03/27/2016, 11:37 AM

## 2016-03-27 NOTE — Progress Notes (Signed)
PROGRESS NOTE  [name]  [id number] DOB: [birth year] DOA: 03/25/2016 PCP: Tivis Ringer, MD  Outpatient Specialists: Dr. Gaynelle Arabian, Urology Dr. 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 Gaynelle Arabian.   He has had prior gross hematuria 05/01/15 He underwent cystoscopy and thulium laser TURP on 03/12/2016, prescribed postprocedure trimethoprim but had significant N/V with this so he stopped after 2 doses.   Appetite has been poor and he's had severe fatigue with difficulty ambulating for the last 2 days. He denies fevers, chills, cough, shortness of breath, abd pain, or change in urinary symptoms. He wears a pad at night to catch his urine. He came to the emergency department due to progressive weakness.   In the ED he was afebrile, WBC 18.5 up from 7.8 prior to procedure.  Hemoglobin was 10.6.  Creatinine was 1.66, approximately at baseline, and UA was positive for copious blood and copious white blood cells.  KUB demonstrated moderate stool throughout the colon without obstruction.  He was given IV fluids and started on cefepime for UTI.  Overnight leukocytosis improved, though all cell lines dropped (hgb 10.6 > 8.8 and Plt 225 > 181). trasnfused 2/13  LBBB was noted on ECG on admission and remained the following morning, so cardiology was consulted-rec Echo-done 2/13 showing no WM anomaly, PASP 45/BNP 430  Due to large volume hematuria noted on 2/13, urology was also consulted-rec holding Abx and ? Needs furthe roperative management  Assessment & Plan: Principal Problem:   UTI (urinary tract infection) Active Problems:   Coronary artery disease   Chronic atrial fibrillation (HCC)   Acute  posthemorrhagic anemia   Bladder tumor   Hematuria   LBBB (left bundle branch block)  Weakness: Multifactorial-dehydration from N/V , ABLA due to hematuria - PT evaluation recommending SNF at his current level of functioning.  - Treat conditions as below  Nausea and vomiting: DDx includes drug reaction (trimethoprim), UTI, cholecystitis (GB sludge and wall thickening but without abd pain or tenderness), gastritis from EtOH, pancreatitis (mildly elevated lipase but wants po), ACS (new LBBB on ECG but no chest pain).   - per Urology ok to hold cefepime, monitor urine culture and blood cultures. - Judicious use of IVF given lower extremity edema and pleural effusions - Right hydronephrosis on U/S - Lactate negative. I do not believe this is sepsis.   High grade bladder Ca Laser  vape of prostate 1/30 -urology recs 5 ARI If bleeding continues might need Or management  Acute on chronic blood loss anemia:  due to hematuria. No blood in stool/emesis grossly, FOBT negative in ED. Had episode of large volume hematuria 2/13 - Consulted urology - STAT CBC daily - Iron studies consistent with iron depletion, will give feraheme - B12 was 249 and indices are mildly macrocytic, will start supplementation.  - Hold pradaxa for now -received 1 U PRBC 2/13  CAD s/p MI and balloon angioplasty in 1980's, PVD s/p bilateral CEA in 2005: Followed by Dr. Angelena Form. Chest pain free but new LBBB on ECG. - Cardiology consulted. - Troponins cycled and are negative.  - Echocardiogram 2/13 showed  ? pasp, no WM anomaliy - Holding pradaxa as above; not on aspirin - Increased to daily rosuvastatin, monitor  for tolerance - Continue metoprolol  Chronic atrial fibrillation, rate controlled:  - Holding pradaxa as above - Continue metoprolol XL 100 qd  Bladder cancer s/p resection April 2017: with recent TURP - Per urology, consulted Dr. Alyson Ingles 2/13.   Constipation: Took dulcolax with resultant diarrhea  followed by 3-4 days of no BM. KUB w/o obstruction. TSH wnl.  -  Bisacodyl suppositories -  miralax BID -  Senna  Prolonged QTc -  Avoid QT prolonging medications -  K and Mg wnl.  PUD:  - Continue PPI  Chronic kidney disease stage 3/4, creatinine near baseline -  Avoid nephrotoxic agents -  Renally dose medications  EtOH use: Daily use. U/S showed liver nodularity with ascites. Cirrhosis may explain/contribute to macrocytosis, mild hyponatremia.  -  CIWA protocol has been neg -ok to possibly come off of ativan in next 24 hours if no overt s/sym withdrawal  DVT prophylaxis: SCD's Code Status: Full Family Communication: Son at bedside Disposition Plan: Uncertain  Consultants:   Cardiology  Urology  Procedures:   Echo ordered  Antimicrobials:  Cefepime   Subjective:  Hungry No new issues tol clears Still some hematuroa--unclear what has been reported so far today  Objective: Vitals:   03/26/16 1639 03/26/16 1901 03/26/16 2133 03/27/16 0604  BP: 112/60 (!) 121/57 (!) 118/54 120/75  Pulse: 63 71 82 86  Resp: 20 20 18 18   Temp: 98.2 F (36.8 C) 98.8 F (37.1 C) 98.3 F (36.8 C) 97.9 F (36.6 C)  TempSrc: Oral Oral Oral Oral  SpO2: 99% 96% 93% 92%  Weight:      Height:        Intake/Output Summary (Last 24 hours) at 03/27/16 0801 Last data filed at 03/26/16 2103  Gross per 24 hour  Intake              645 ml  Output              100 ml  Net              545 ml   Filed Weights   03/25/16 1746 03/25/16 1901  Weight: 82.6 kg (182 lb) 82.6 kg (182 lb)    Examination: General exam: 76 y.o. male in no distress Respiratory system: Non-labored breathing room air. Clear to auscultation bilaterally with diminished sounds at bases Cardiovascular system: Regular rate and rhythm. No murmur, rub, or gallop. No JVD, and 1+ pedal edema. Gastrointestinal system: Abdomen soft, suprapubic tenderness without rebound or guarding. No RUQ tenderness.  Non-distended, with normoactive bowel sounds. No organomegaly or masses felt. Central nervous system: Alert and oriented. No focal neurological deficits. Extremities: Warm, no deformities Skin: No rashes, lesions no ulcers Psychiatry: Judgement and insight appear normal. Off, flattened affect.  Data Reviewed: I have personally reviewed following labs and imaging studies  CBC:  Recent Labs Lab 03/25/16 1500 03/26/16 0222 03/26/16 0802 03/26/16 1447 03/27/16 0516  WBC 18.5* 12.1* 11.2* 12.7* 9.6  NEUTROABS 17.1*  --   --   --  7.8*  HGB 10.6* 8.8* 8.4* 8.3* 10.0*  HCT 31.6* 25.9* 25.4* 25.0* 29.5*  MCV 99.7 101.6* 100.8* 100.8* 99.7  PLT 225 181 179 176 0000000   Basic Metabolic Panel:  Recent Labs Lab 03/25/16 1500 03/26/16 0222 03/27/16 0516  NA 133* 133* 136  K 4.0 3.5 3.3*  CL 93* 98* 99*  CO2 28 27 28   GLUCOSE 121* 94 100*  BUN 22* 20 20  CREATININE 1.66* 1.58* 1.53*  CALCIUM  8.5* 8.0* 8.3*  MG  --  1.7 2.1   GFR: Estimated Creatinine Clearance: 43.1 mL/min (by C-G formula based on SCr of 1.53 mg/dL (H)). Liver Function Tests:  Recent Labs Lab 03/25/16 1500 03/26/16 0222 03/27/16 0516  AST 19 16 18   ALT 10* 9* 9*  ALKPHOS 70 55 60  BILITOT 1.3* 0.9 1.5*  PROT 6.1* 5.0* 5.2*  ALBUMIN 3.1* 2.5* 2.6*    Recent Labs Lab 03/25/16 1500 03/26/16 0222  LIPASE 68* 74*   No results for input(s): AMMONIA in the last 168 hours. Coagulation Profile: No results for input(s): INR, PROTIME in the last 168 hours. Cardiac Enzymes:  Recent Labs Lab 03/25/16 2009 03/26/16 0222 03/26/16 0802  TROPONINI <0.03 <0.03 <0.03   BNP (last 3 results) No results for input(s): PROBNP in the last 8760 hours. HbA1C: No results for input(s): HGBA1C in the last 72 hours. CBG:  Recent Labs Lab 03/25/16 1323  GLUCAP 126*   Lipid Profile: No results for input(s): CHOL, HDL, LDLCALC, TRIG, CHOLHDL, LDLDIRECT in the last 72 hours. Thyroid Function Tests:  Recent  Labs  03/26/16 0222  TSH 1.035   Anemia Panel:  Recent Labs  03/26/16 0222  VITAMINB12 249  FOLATE 7.0  FERRITIN 94  TIBC 238*  IRON 22*   Urine analysis:    Component Value Date/Time   COLORURINE RED (A) 03/25/2016 1500   APPEARANCEUR TURBID (A) 03/25/2016 1500   LABSPEC  03/25/2016 1500    TEST NOT REPORTED DUE TO COLOR INTERFERENCE OF URINE PIGMENT   PHURINE  03/25/2016 1500    TEST NOT REPORTED DUE TO COLOR INTERFERENCE OF URINE PIGMENT   GLUCOSEU (A) 03/25/2016 1500    TEST NOT REPORTED DUE TO COLOR INTERFERENCE OF URINE PIGMENT   GLUCOSEU NEGATIVE 08/23/2010 0750   HGBUR (A) 03/25/2016 1500    TEST NOT REPORTED DUE TO COLOR INTERFERENCE OF URINE PIGMENT   BILIRUBINUR (A) 03/25/2016 1500    TEST NOT REPORTED DUE TO COLOR INTERFERENCE OF URINE PIGMENT   KETONESUR (A) 03/25/2016 1500    TEST NOT REPORTED DUE TO COLOR INTERFERENCE OF URINE PIGMENT   PROTEINUR (A) 03/25/2016 1500    TEST NOT REPORTED DUE TO COLOR INTERFERENCE OF URINE PIGMENT   UROBILINOGEN 0.2 05/23/2012 1618   NITRITE (A) 03/25/2016 1500    TEST NOT REPORTED DUE TO COLOR INTERFERENCE OF URINE PIGMENT   LEUKOCYTESUR (A) 03/25/2016 1500    TEST NOT REPORTED DUE TO COLOR INTERFERENCE OF URINE PIGMENT   Recent Results (from the past 240 hour(s))  Urine culture     Status: None (Preliminary result)   Collection Time: 03/25/16  3:00 PM  Result Value Ref Range Status   Specimen Description URINE, CLEAN CATCH  Final   Special Requests Immunocompromised  Final   Culture   Final    CULTURE REINCUBATED FOR BETTER GROWTH Performed at [facility], [place] [address], [place], [place] [address]    Report Status PENDING  Incomplete  Blood Culture (routine x 2)     Status: None (Preliminary result)   Collection Time: 03/25/16  8:02 PM  Result Value Ref Range Status   Specimen Description BLOOD RIGHT ARM  Final   Special Requests AEROBIC BOTTLE ONLY 5CC  Final   Culture   Final    NO GROWTH < 24  HOURS Performed at [facility], [place] [address] [place], [place] [address]    Report Status PENDING  Incomplete  Radiology Studies: Dg Abd Acute W/chest  Result Date: 03/25/2016 CLINICAL DATA:  Initial evaluation for acute vomiting, constipation. EXAM: DG ABDOMEN ACUTE W/ 1V CHEST COMPARISON:  Prior CT from 05/10/2015. FINDINGS: Mild cardiomegaly. Mediastinal silhouette within normal limits. Aortic atherosclerosis noted. Lungs normally inflated. Blunting of the bilateral costophrenic angles suggests small pleural effusions. Associated patchy and linear bibasilar opacities, favored to reflect atelectasis. Superimposed infiltrates not excluded. No other focal infiltrates. No pulmonary edema. No pneumothorax. Bowel gas pattern within normal limits without evidence for obstruction or ileus. No free air. No abnormal bowel wall thickening. Moderate amount of retained stool within the colon. No soft tissue mass identified. Scattered vascular calcifications noted within the abdomen. Mild scoliosis with multilevel degenerative spondylolysis noted within the visualized spine. Advanced osteoarthritic changes noted about the hips. No acute osseous abnormality. IMPRESSION: 1. Nonobstructive bowel gas pattern with no radiographic evidence for acute intra-abdominal process. 2. Moderate amount of retained stool within the colon. 3. Small bilateral pleural effusions. Associated bibasilar opacities favored to reflect atelectasis, although possible infiltrates could be considered in the correct clinical setting. 4. Extensive aortic atherosclerosis. Electronically Signed   By: Jeannine Boga M.D.   On: 03/25/2016 14:53   US Abdomen Limited Ruq  Result Date: 03/26/2016 CLINICAL DATA:  76 year old male with elevated bilirubin. Bladder cancer. Hypertension. Initial encounter. EXAM: US ABDOMEN LIMITED - RIGHT UPPER QUADRANT COMPARISON:  05/10/2015 CT. FINDINGS: Gallbladder: Gallbladder sludge. Gallbladder wall  thickening measuring up to 3.5 mm. Patient was not tender over the gallbladder during scanning per ultrasound technologist. Common bile duct: Diameter: 6.7 mm. Liver: Nodular contour raises possibility of cirrhosis without focal mass or intrahepatic biliary duct dilation. Ascites. Right-sided pleural effusion. Right-sided hydronephrosis. IMPRESSION: Gallbladder sludge. Gallbladder wall thickening measuring up to 3.5 mm. Patient was not tender over the gallbladder during scanning per ultrasound technologist. It is possible gallbladder wall thickening is related to metabolic abnormality or cirrhosis rather than cholecystitis. Nodular contour raises possibility of cirrhosis without focal mass or intrahepatic biliary duct dilation. Ascites. Right-sided pleural effusion. Right-sided hydronephrosis. Electronically Signed   By: Genia Del M.D.   On: 03/26/2016 09:56    Scheduled Meds: . allopurinol  300 mg Oral Daily  . bisacodyl  10 mg Rectal Once  . ceFEPime (MAXIPIME) IV  2 g Intravenous Q24H  . finasteride  5 mg Oral Daily  . folic acid  1 mg Oral Daily  . LORazepam  0-4 mg Oral Q6H   Followed by  . LORazepam  0-4 mg Oral Q12H  . metoprolol succinate  100 mg Oral Daily  . multivitamin with minerals  1 tablet Oral Daily  . pantoprazole (PROTONIX) IV  40 mg Intravenous BID  . polyethylene glycol  17 g Oral BID  . rosuvastatin  20 mg Oral Once per day on Mon Thu  . senna  2 tablet Oral QHS  . thiamine  100 mg Oral Daily   Or  . thiamine  100 mg Intravenous Daily   Continuous Infusions:   LOS: 2 days   Time spent: 25 minutes.  Nita Sells, MD [facility] Pager [phone]  If 7PM-7AM, please contact night-coverage [email or web address] Password [email or web address] 03/27/2016, 8:01 AM

## 2016-03-27 NOTE — Progress Notes (Signed)
Occupational Therapy Treatment Patient Details Name: [name] MRN: [id number] DOB: August 19, 1940 Today's Date: 03/27/2016    History of present illness [name] is a 76 y.o. male with a history of CAD and chronic atrial fibrillation , diverticulosis, PUD, and bladder cancer diagnosed in 2017 , usual hematuriT URP with  Cystoscopy on 03/12/2016. Admitted with  progressive weakness   OT comments  Goal added for BUE Exercise program  Follow Up Recommendations  Home health OT    Equipment Recommendations  3 in 1 bedside commode    Recommendations for Other Services      Precautions / Restrictions Precautions Precautions: Fall Precaution Comments: incontinence of bloody urine- a lot, wear pullups       Mobility Bed Mobility        NT          Transfers              refused            ADL Overall ADL's : Needs assistance/impaired                                       General ADL Comments: pt would only agree to BUE Exercise this OT visit. Pt refused OOB.                  Cognition   Behavior During Therapy: Flat affect Overall Cognitive Status: Within Functional Limits for tasks assessed          Following Commands: Follows multi-step commands with increased time              Exercises General Exercises - Upper Extremity Shoulder Flexion: Both;Supine;AROM;20 reps Shoulder Extension: AROM;Both;Supine;20 reps Elbow Flexion: 20 reps;Both;AROM Elbow Extension: AROM;20 reps;Both;Supine      General Comments      Pertinent Vitals/ Pain       Faces Pain Scale: Hurts little more Pain Location: generalized  Pain Descriptors / Indicators: Sore Pain Intervention(s): Monitored during session;Repositioned         Frequency  Min 2X/week        Progress Toward Goals  OT Goals(current goals can now be found in the care plan section)  Progress towards OT goals: Progressing toward goals     Plan Discharge  plan needs to be updated    Co-evaluation                 End of Session     Activity Tolerance Patient tolerated treatment well   Patient Left in bed;with call bell/phone within reach   Nurse Communication Mobility status        Time: 1520-1540 OT Time Calculation (min): 20 min  Charges: OT General Charges $OT Visit: 1 Procedure OT Treatments $Therapeutic Exercise: 8-22 mins  Sherley Mckenney, Edwena Felty D 03/27/2016, 4:14 PM

## 2016-03-27 NOTE — Progress Notes (Addendum)
The patient has been seen in conjunction with Reino Bellis, NP-C. All aspects of care have been considered and discussed. The patient has been personally interviewed, examined, and all clinical data has been reviewed.   New LBBB probably age related. Has AF and now LBBB due to degenerative conduction system disease.  The elevated PA systolic pressure is moderately elevated and in his clinical profile sleep apnea(snores loudly and stops breathing according to son-in -law), recurrent PE (unlikely since chronically anticoagulated) , and chronic diastolic HF(not grossly volume overloaded) are considerations.  I don't believe any further cardiac w/u necessary at this time.  Progress Note  Patient Name: [name] Date of Encounter: 03/27/2016  Primary Cardiologist: Dr. Angelena Form  Subjective   Resting in bed. No specific complaints.   Inpatient Medications    Scheduled Meds: . allopurinol  300 mg Oral Daily  . bisacodyl  10 mg Rectal Once  . ceFEPime (MAXIPIME) IV  2 g Intravenous Q24H  . finasteride  5 mg Oral Daily  . folic acid  1 mg Oral Daily  . LORazepam  0-4 mg Oral Q6H   Followed by  . LORazepam  0-4 mg Oral Q12H  . metoprolol succinate  100 mg Oral Daily  . multivitamin with minerals  1 tablet Oral Daily  . pantoprazole (PROTONIX) IV  40 mg Intravenous BID  . polyethylene glycol  17 g Oral BID  . potassium chloride  40 mEq Oral Daily  . rosuvastatin  20 mg Oral Once per day on Mon Thu  . senna  2 tablet Oral QHS  . thiamine  100 mg Oral Daily   Or  . thiamine  100 mg Intravenous Daily   Continuous Infusions:  PRN Meds: hyoscyamine, LORazepam **OR** LORazepam, promethazine   Vital Signs    Vitals:   03/26/16 1639 03/26/16 1901 03/26/16 2133 03/27/16 0604  BP: 112/60 (!) 121/57 (!) 118/54 120/75  Pulse: 63 71 82 86  Resp: 20 20 18 18   Temp: 98.2 F (36.8 C) 98.8 F (37.1 C) 98.3 F (36.8 C) 97.9 F (36.6 C)  TempSrc: Oral Oral Oral Oral  SpO2: 99%  96% 93% 92%  Weight:      Height:        Intake/Output Summary (Last 24 hours) at 03/27/16 0847 Last data filed at 03/26/16 2103  Gross per 24 hour  Intake              645 ml  Output              100 ml  Net              545 ml   Filed Weights   03/25/16 1746 03/25/16 1901  Weight: 182 lb (82.6 kg) 182 lb (82.6 kg)    Telemetry    SR with SB episodes (mostly while sleeping) - Personally Reviewed  ECG    N/A - Personally Reviewed  Physical Exam   GEN: Pleasant older male, No acute distress.   Neck: No JVD Cardiac: Irreg Irreg, no murmurs, rubs, or gallops.  Respiratory: Clear to auscultation bilaterally. GI: Soft, nontender, non-distended  MS: No edema; No deformity. DP/PT/Radials 2+ and equal bilaterally Neuro:  Nonfocal  Psych: Normal affect   Labs    Chemistry Recent Labs Lab 03/25/16 1500 03/26/16 0222 03/27/16 0516  NA 133* 133* 136  K 4.0 3.5 3.3*  CL 93* 98* 99*  CO2 28 27 28   GLUCOSE 121* 94 100*  BUN 22*  20 20  CREATININE 1.66* 1.58* 1.53*  CALCIUM 8.5* 8.0* 8.3*  PROT 6.1* 5.0* 5.2*  ALBUMIN 3.1* 2.5* 2.6*  AST 19 16 18   ALT 10* 9* 9*  ALKPHOS 70 55 60  BILITOT 1.3* 0.9 1.5*  GFRNONAA 38* 41* 42*  GFRAA 45* 47* 49*  ANIONGAP 12 8 9      Hematology Recent Labs Lab 03/26/16 0802 03/26/16 1447 03/27/16 0516  WBC 11.2* 12.7* 9.6  RBC 2.52* 2.48* 2.96*  HGB 8.4* 8.3* 10.0*  HCT 25.4* 25.0* 29.5*  MCV 100.8* 100.8* 99.7  MCH 33.3 33.5 33.8  MCHC 33.1 33.2 33.9  RDW 15.7* 15.7* 17.4*  PLT 179 176 175    Cardiac Enzymes Recent Labs Lab 03/25/16 2009 03/26/16 0222 03/26/16 0802  TROPONINI <0.03 <0.03 <0.03    Recent Labs Lab 03/25/16 1509  TROPIPOC 0.00     BNP Recent Labs Lab 03/26/16 1627  BNP 425.1*     DDimer No results for input(s): DDIMER in the last 168 hours.   Radiology    Dg Abd Acute W/chest  Result Date: 03/25/2016 CLINICAL DATA:  Initial evaluation for acute vomiting, constipation. EXAM: DG ABDOMEN  ACUTE W/ 1V CHEST COMPARISON:  Prior CT from 05/10/2015. FINDINGS: Mild cardiomegaly. Mediastinal silhouette within normal limits. Aortic atherosclerosis noted. Lungs normally inflated. Blunting of the bilateral costophrenic angles suggests small pleural effusions. Associated patchy and linear bibasilar opacities, favored to reflect atelectasis. Superimposed infiltrates not excluded. No other focal infiltrates. No pulmonary edema. No pneumothorax. Bowel gas pattern within normal limits without evidence for obstruction or ileus. No free air. No abnormal bowel wall thickening. Moderate amount of retained stool within the colon. No soft tissue mass identified. Scattered vascular calcifications noted within the abdomen. Mild scoliosis with multilevel degenerative spondylolysis noted within the visualized spine. Advanced osteoarthritic changes noted about the hips. No acute osseous abnormality. IMPRESSION: 1. Nonobstructive bowel gas pattern with no radiographic evidence for acute intra-abdominal process. 2. Moderate amount of retained stool within the colon. 3. Small bilateral pleural effusions. Associated bibasilar opacities favored to reflect atelectasis, although possible infiltrates could be considered in the correct clinical setting. 4. Extensive aortic atherosclerosis. Electronically Signed   By: Jeannine Boga M.D.   On: 03/25/2016 14:53   US Abdomen Limited Ruq  Result Date: 03/26/2016 CLINICAL DATA:  76 year old male with elevated bilirubin. Bladder cancer. Hypertension. Initial encounter. EXAM: US ABDOMEN LIMITED - RIGHT UPPER QUADRANT COMPARISON:  05/10/2015 CT. FINDINGS: Gallbladder: Gallbladder sludge. Gallbladder wall thickening measuring up to 3.5 mm. Patient was not tender over the gallbladder during scanning per ultrasound technologist. Common bile duct: Diameter: 6.7 mm. Liver: Nodular contour raises possibility of cirrhosis without focal mass or intrahepatic biliary duct dilation. Ascites.  Right-sided pleural effusion. Right-sided hydronephrosis. IMPRESSION: Gallbladder sludge. Gallbladder wall thickening measuring up to 3.5 mm. Patient was not tender over the gallbladder during scanning per ultrasound technologist. It is possible gallbladder wall thickening is related to metabolic abnormality or cirrhosis rather than cholecystitis. Nodular contour raises possibility of cirrhosis without focal mass or intrahepatic biliary duct dilation. Ascites. Right-sided pleural effusion. Right-sided hydronephrosis. Electronically Signed   By: Genia Del M.D.   On: 03/26/2016 09:56    Cardiac Studies   TTE: 03/26/16  Study Conclusions  - Left ventricle: The cavity size was normal. Systolic function was   normal. The estimated ejection fraction was 55%. Wall motion was   normal; there were no regional wall motion abnormalities. - Aortic valve: Trileaflet; normal thickness, mildly calcified  leaflets. There was mild regurgitation. - Mitral valve: There was mild to moderate regurgitation. - Left atrium: The atrium was mildly dilated. Anterior-posterior   dimension: 42 mm. - Right atrium: The atrium was moderately dilated. - Pulmonic valve: There was trivial regurgitation. - Pulmonary arteries: PA peak pressure: 45 mm Hg (S).  Impressions:  - The right ventricular systolic pressure was increased consistent   with moderate pulmonary hypertension.  Patient Profile     76 y.o. male with PMH of persistent AF, HTN, CAD s/p MI (balloon angioplasty '80s and PVD who presented with generalized weakness. Found to have new LBBB on EKG.    Assessment & Plan    1. Permanent AF with new LBBB: Reports last cardiac intervention he has was back in the 80s and has done well since that time. Very active at home and cares for himself, does not have any exertional angina. EKG this admission showed new LBBB from previous EKG in 4/17, AF rate controlled. TSH normal.  -- trop neg x3 -- Echo showed  normal LV function with no WMA. No cardiac complaints.   2. Generalized weakness with n/v: Reports he has not felt back to baseline since having TURP done back in 1/18. Has had ongoing hematuria since that time, along with constipation, nausea and vomiting over the past couple of days. Likely 2/2 to other acute illness.  -- right hydronephrosis on U/S  3. UTI/Hematuria: Noted on admission, urology following.   4. Anemia: Hgb drop 12.5 pre TURP, 10.6>>8.8>>8.3 yesterday. Given 1 unit PRBCs. FOBT negative. Hgb 10 today. Pradaxa held, would resume once cleared from urology standpoint.   5. CKD III: Cr improved today with IVF.   6. Pleural Effusions: BNP 425, given a dose of IV lasix 40mg  yesterday. Not much UOP noted, but states he was incontinent   7. Hypokalemia: Replete, monitor BMET  Signed, Reino Bellis, NP  03/27/2016, 8:47 AM

## 2016-03-28 DIAGNOSIS — D62 Acute posthemorrhagic anemia: Secondary | ICD-10-CM

## 2016-03-28 DIAGNOSIS — D494 Neoplasm of unspecified behavior of bladder: Secondary | ICD-10-CM

## 2016-03-28 LAB — CBC
HEMATOCRIT: 30.2 % — AB (ref 39.0–52.0)
HEMOGLOBIN: 10 g/dL — AB (ref 13.0–17.0)
MCH: 33.2 pg (ref 26.0–34.0)
MCHC: 33.1 g/dL (ref 30.0–36.0)
MCV: 100.3 fL — ABNORMAL HIGH (ref 78.0–100.0)
Platelets: 172 10*3/uL (ref 150–400)
RBC: 3.01 MIL/uL — ABNORMAL LOW (ref 4.22–5.81)
RDW: 17.1 % — AB (ref 11.5–15.5)
WBC: 9.1 10*3/uL (ref 4.0–10.5)

## 2016-03-28 LAB — COMPREHENSIVE METABOLIC PANEL
ALT: 10 U/L — AB (ref 17–63)
ANION GAP: 8 (ref 5–15)
AST: 17 U/L (ref 15–41)
Albumin: 2.6 g/dL — ABNORMAL LOW (ref 3.5–5.0)
Alkaline Phosphatase: 60 U/L (ref 38–126)
BUN: 19 mg/dL (ref 6–20)
CHLORIDE: 104 mmol/L (ref 101–111)
CO2: 27 mmol/L (ref 22–32)
CREATININE: 1.62 mg/dL — AB (ref 0.61–1.24)
Calcium: 8.6 mg/dL — ABNORMAL LOW (ref 8.9–10.3)
GFR calc non Af Amer: 40 mL/min — ABNORMAL LOW (ref >60)
GFR, EST AFRICAN AMERICAN: 46 mL/min — AB (ref >60)
Glucose, Bld: 95 mg/dL (ref 65–99)
POTASSIUM: 3.8 mmol/L (ref 3.5–5.1)
SODIUM: 139 mmol/L (ref 135–145)
Total Bilirubin: 1.3 mg/dL — ABNORMAL HIGH (ref 0.3–1.2)
Total Protein: 5.1 g/dL — ABNORMAL LOW (ref 6.5–8.1)

## 2016-03-28 LAB — URINE CULTURE: Culture: 70000 — AB

## 2016-03-28 LAB — MAGNESIUM: Magnesium: 2.1 mg/dL (ref 1.7–2.4)

## 2016-03-28 MED ORDER — FINASTERIDE 5 MG PO TABS: 5.0000 mg | ORAL_TABLET | Freq: Every day | ORAL | 3 refills | Status: AC

## 2016-03-28 MED ORDER — ROSUVASTATIN CALCIUM 20 MG PO TABS: 20.0000 mg | ORAL_TABLET | ORAL | Status: DC

## 2016-03-28 MED ORDER — PANTOPRAZOLE SODIUM 40 MG PO TBEC: 40.0000 mg | DELAYED_RELEASE_TABLET | Freq: Two times a day (BID) | ORAL | 0 refills | Status: DC

## 2016-03-28 NOTE — Progress Notes (Signed)
Physical Therapy Treatment Patient Details Name: HESSTON DOSCH MRN: [id number] DOB: 1940-12-11 Today's Date: 03/28/2016    History of Present Illness JOHNLUKE LINDY is a 76 y.o. male with a history of CAD and chronic atrial fibrillation , diverticulosis, PUD, and bladder cancer diagnosed in 2017 , usual hematuriT URP with  Cystoscopy on 03/12/2016. Admitted with  progressive weakness    PT Comments    The patient is ambulating with supervision with and without the RW. Requires  Much encouragement.  Plans Dc home with HHPT. Wife present.  Follow Up Recommendations  Home health PT     Equipment Recommendations  None recommended by PT    Recommendations for Other Services       Precautions / Restrictions Precautions Precautions: Fall Precaution Comments: incontinence of bloody urine- a lot, wear pullups    Mobility  Bed Mobility Overal bed mobility: Independent Bed Mobility: Supine to Sit     Supine to sit: Supervision        Transfers Overall transfer level: Needs assistance Equipment used: Rolling walker (2 wheeled);None Transfers: Sit to/from Stand Sit to Stand: Supervision Stand pivot transfers: Supervision       General transfer comment: steady assist to rise from the bed.   Ambulation/Gait Ambulation/Gait assistance: Min guard Ambulation Distance (Feet): 80 Feet Assistive device: Rolling walker (2 wheeled) Gait Pattern/deviations: Step-through pattern;Shuffle     General Gait Details: ambulated on  RW and then without with much encouragement from the wife and did OK   Stairs            Wheelchair Mobility    Modified Rankin (Stroke Patients Only)       Balance                                    Cognition Arousal/Alertness: Awake/alert Behavior During Therapy: Flat affect Overall Cognitive Status: Within Functional Limits for tasks assessed                      Exercises      General Comments         Pertinent Vitals/Pain Pain Assessment: No/denies pain    Home Living                      Prior Function            PT Goals (current goals can now be found in the care plan section) Progress towards PT goals: Progressing toward goals    Frequency    Min 3X/week      PT Plan Current plan remains appropriate    Co-evaluation             End of Session Equipment Utilized During Treatment: Gait belt Activity Tolerance: Patient tolerated treatment well Patient left: in bed;with call bell/phone within reach     Time: [id number] PT Time Calculation (min) (ACUTE ONLY): 22 min  Charges:  $Gait Training: 8-22 mins                    G Codes:      Claretha Cooper 03/28/2016, 1:07 PM Tresa Endo PT [phone]

## 2016-03-28 NOTE — Discharge Summary (Signed)
Physician Discharge Summary  [name] [id number] DOB: [birth year] DOA: 03/25/2016  PCP: Tivis Ringer, MD  Admit date: 03/25/2016 Discharge date: 03/28/2016  Time spent: 30 minutes  Recommendations for Outpatient Follow-up:  1. Recommend close and careful follow-up with urologist regarding further workup for prostate cancer. On this discharge patient's pradaxa was held and discussion with urologist needs to occur prior to resumption of same given amount of bleeding 2. Patient should be monitored for further issues with hematuria 3. Would recommend CBC in about one week 4. May need adjustment of beta blocker and amlodipine as an outpatient given he had some nonsustained pauses this admission he also had left under branch block this admission which was not thought to represent any type of ischemic process. 5. 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 Gaynelle Arabian.   He has had prior gross hematuria 05/01/15 He underwent cystoscopy and thulium laser TURP on 03/12/2016, prescribed postprocedure trimethoprim but had significant N/V with this so he stopped after 2 doses.    Appetite has been poor and he's had severe fatigue with difficulty  ambulating for the last 2 days. He denies fevers, chills, cough, shortness of breath, abd pain, or change in urinary symptoms. He wears a pad at night to catch his urine. He came to the emergency department due to progressive weakness.    In the ED he was afebrile, WBC 18.5 up from 7.8 prior to procedure.  Hemoglobin was 10.6.  Creatinine was 1.66, approximately at baseline, and UA was positive for copious blood and copious white blood cells.  KUB demonstrated moderate stool throughout the colon without obstruction.  He was given IV fluids and started on cefepime for UTI.  Overnight leukocytosis improved, though all cell lines dropped (hgb 10.6 > 8.8 and Plt 225 > 181). trasnfused 2/13  LBBB was noted on ECG on admission and remained the following morning, so cardiology was consulted-rec Echo-done 2/13 showing no WM anomaly, PASP 45/BNP 430     Due to large volume hematuria noted on 2/13, urology was also consulted-rec holding Abx  Weakness: Multifactorial-dehydration from N/V , ABLA due to hematuria - PT evaluation recommending SNF at his current level of functioning.  - Treat conditions as below -He was reevaluated by therapy services to 14/2:15 and felt stable to discharge home with home health as well as walker and 3 in 1   Nausea and vomiting: DDx includes drug reaction (trimethoprim), UTI, cholecystitis (GB sludge and wall thickening but without abd pain or tenderness), gastritis from EtOH, pancreatitis (mildly elevated lipase but wants po), ACS (new LBBB on ECG but no chest pain).   - per Urology ok to hold cefepime, urine culture showed Enterococcus faecalis less than 100,000, blood cultures were  negative - Judicious use of IVF given lower extremity edema and pleural effusions - Right hydronephrosis on U/S - Lactate negative. I do not believe this is sepsis.    Acute on chronic blood loss anemia:  due to hematuria. No blood in stool/emesis grossly, FOBT negative in ED. Had episode of  large volume hematuria 2/13 [facility] urology who saw the patient and felt it stabilized would be reasonable for observation - Iron studies consistent with iron depletion, will give feraheme - B12 was 249 and indices are mildly macrocytic, will start supplementation.  - Hold pradaxa for now-this should be resumed only after patient has been seen by urology to determine risk of bleed on this -received 1 U PRBC 2/13   CAD s/p MI and balloon angioplasty in 1980's, PVD s/p bilateral CEA in 2005: Followed by Dr. Angelena Form. Chest pain free but new LBBB on ECG. - Cardiology consulted. - Troponins cycled and are negative.  - Echocardiogram 2/13 showed  ? pasp, no WM anomaliy - Holding pradaxa as above; not on aspirin - Increased to daily rosuvastatin, monitor for tolerance - Continue metoprolol   Chronic atrial fibrillation, rate controlled: Has had some pauses as well but does not have tachycardia syndrome - Holding pradaxa as above - Continue metoprolol XL 100 qd   Bladder cancer s/p resection April 2017: with recent TURP - Per urology, consulted Dr. Alyson Ingles 2/13.  -Started on finasteride this admission and will need to continue the same and all of with urology as an out patient    Constipation: Took dulcolax with resultant diarrhea followed by 3-4 days of no BM. KUB w/o obstruction. TSH wnl.  -  Bisacodyl suppositories -  miralax BID -  Senna   Prolonged QTc -  Avoid QT prolonging medications -  K and Mg wnl.   PUD:  - Continue PPI and given prescription on discharge   Chronic kidney disease stage 3/4, creatinine near baseline -  Avoid nephrotoxic agents -  Renally dose medications    EtOH use: Daily use. U/S showed liver nodularity with ascites. Cirrhosis may explain/contribute to macrocytosis, mild hyponatremia.  -  CIWA protocol has been neg -ok to possibly come off of ativan in next 24 hours if no overt s/sym withdrawal   DVT prophylaxis:  SCD's   Consultations:  Cardiology  Urology  Discharge Exam: Vitals:   03/28/16 0519 03/28/16 0837  BP: 114/66 137/66  Pulse: 86 85  Resp: 18 20  Temp: 98.1 F (36.7 C)    Alert pleasant more oriented seems less sleepy No overt complaint Still having some leakage of LAD but seems to be tapering off Wife is very concerned about ambulation and worries about other issues that may impact discharge and home care Passing good stool with sorbitol No nausea no vomiting  Patient emphatically states "I'm ready to go"  General: EOMI NCAT no pallor no ict Cardiovascular: S1-S2 no murmur rub or gallop Respiratory: Clinically clear no added sound  Discharge Instructions   Discharge Instructions    Call MD for:  difficulty breathing, headache or visual disturbances    Complete by:  As directed    Call MD for:  persistant nausea and vomiting    Complete by:  As directed    Call MD for:  temperature >100.4    Complete by:  As directed    Diet - low sodium heart healthy    Complete by:  As directed    Discharge instructions    Complete by:  As directed    Stop taking that for the axis is no cause for bleeding Continue Protonix twice a day which is a stomach acid reducing He started on finasteride this admission I recommend that you get follow-up with your primary care physician and you may need follow-up with her cardiologist as well Urology should be the one to telemetry when to resume taking her per DEXA as this is an effective medication to prevent strokes from occurring as a consequence of your atrial fibrillation   Increase activity slowly    Complete by:  As directed      Current Discharge Medication List    START taking these medications   Details  finasteride (PROSCAR) 5 MG tablet Take 1 tablet (5 mg total) by mouth daily. Qty: 30 tablet, Refills: 3    pantoprazole (PROTONIX) 40 MG tablet Take 1 tablet (40 mg total) by mouth 2 (two) times daily. Qty: 60 tablet,  Refills: 0      CONTINUE these medications which have NOT CHANGED   Details  allopurinol (ZYLOPRIM) 300 MG tablet Take 300 mg by mouth daily.    amLODipine (NORVASC) 5 MG tablet Take 5 mg by mouth daily.     hyoscyamine (LEVSIN SL) 0.125 MG SL tablet Place 0.125 mg under the tongue every 4 (four) hours as needed for cramping.    metoprolol succinate (TOPROL-XL) 100 MG 24 hr tablet Take 100 mg by mouth daily. Take with or immediately following a meal.    Multiple Vitamins-Minerals (PRESERVISION AREDS PO) Take 1 capsule by mouth 2 (two) times daily.    rosuvastatin (CRESTOR) 20 MG tablet Take 20 mg by mouth 2 (two) times a week.       STOP taking these medications     dabigatran (PRADAXA) 150 MG CAPS capsule      omeprazole (PRILOSEC) 40 MG capsule        Allergies  Allergen Reactions  . Eggs Or Egg-Derived Products Nausea And Vomiting   Follow-up Information    [facility] Follow up.   Why:  [facility] Contact information: [address] [place] [address] [phone]            The results of significant diagnostics from this hospitalization (including imaging, microbiology, ancillary and laboratory) are listed below for reference.    Significant Diagnostic Studies: Dg Abd Acute W/chest  Result Date: 03/25/2016 CLINICAL DATA:  Initial evaluation for acute vomiting, constipation. EXAM: DG ABDOMEN ACUTE W/ 1V CHEST COMPARISON:  Prior CT from 05/10/2015. FINDINGS: Mild cardiomegaly. Mediastinal silhouette within normal limits. Aortic atherosclerosis noted. Lungs normally inflated. Blunting of the bilateral costophrenic angles suggests small pleural effusions. Associated patchy and linear bibasilar opacities, favored to reflect atelectasis. Superimposed infiltrates not excluded. No other focal infiltrates. No pulmonary edema. No pneumothorax. Bowel gas pattern within normal limits without evidence for obstruction or ileus. No free  air. No abnormal bowel wall thickening. Moderate amount of retained stool within the colon. No soft tissue mass identified. Scattered vascular calcifications noted within the abdomen. Mild scoliosis with multilevel degenerative spondylolysis noted within the visualized spine. Advanced osteoarthritic changes noted about the hips. No acute osseous abnormality. IMPRESSION: 1. Nonobstructive bowel gas pattern with no radiographic evidence for acute intra-abdominal process. 2. Moderate amount of retained stool within the colon. 3. Small bilateral pleural effusions. Associated bibasilar opacities favored to reflect atelectasis, although possible infiltrates could be considered in the correct clinical setting. 4. Extensive aortic atherosclerosis. Electronically Signed   By:  Jeannine Boga M.D.   On: 03/25/2016 14:53   US Abdomen Limited Ruq  Result Date: 03/26/2016 CLINICAL DATA:  76 year old male with elevated bilirubin. Bladder cancer. Hypertension. Initial encounter. EXAM: US ABDOMEN LIMITED - RIGHT UPPER QUADRANT COMPARISON:  05/10/2015 CT. FINDINGS: Gallbladder: Gallbladder sludge. Gallbladder wall thickening measuring up to 3.5 mm. Patient was not tender over the gallbladder during scanning per ultrasound technologist. Common bile duct: Diameter: 6.7 mm. Liver: Nodular contour raises possibility of cirrhosis without focal mass or intrahepatic biliary duct dilation. Ascites. Right-sided pleural effusion. Right-sided hydronephrosis. IMPRESSION: Gallbladder sludge. Gallbladder wall thickening measuring up to 3.5 mm. Patient was not tender over the gallbladder during scanning per ultrasound technologist. It is possible gallbladder wall thickening is related to metabolic abnormality or cirrhosis rather than cholecystitis. Nodular contour raises possibility of cirrhosis without focal mass or intrahepatic biliary duct dilation. Ascites. Right-sided pleural effusion. Right-sided hydronephrosis. Electronically  Signed   By: Genia Del M.D.   On: 03/26/2016 09:56    Microbiology: Recent Results (from the past 240 hour(s))  Urine culture     Status: Abnormal   Collection Time: 03/25/16  3:00 PM  Result Value Ref Range Status   Specimen Description URINE, CLEAN CATCH  Final   Special Requests Immunocompromised  Final   Culture 70,000 COLONIES/mL ENTEROCOCCUS FAECALIS (A)  Final   Report Status 03/28/2016 FINAL  Final   Organism ID, Bacteria ENTEROCOCCUS FAECALIS (A)  Final      Susceptibility   Enterococcus faecalis - MIC*    AMPICILLIN <=2 SENSITIVE Sensitive     LEVOFLOXACIN 1 SENSITIVE Sensitive     NITROFURANTOIN <=16 SENSITIVE Sensitive     VANCOMYCIN 1 SENSITIVE Sensitive     * 70,000 COLONIES/mL ENTEROCOCCUS FAECALIS  Blood Culture (routine x 2)     Status: None (Preliminary result)   Collection Time: 03/25/16  5:37 PM  Result Value Ref Range Status   Specimen Description BLOOD LEFT ANTECUBITAL  Final   Special Requests BOTTLES DRAWN AEROBIC AND ANAEROBIC 5CC EACH  Final   Culture   Final    NO GROWTH 2 DAYS Performed at [facility], [address], [place], [place] [address]    Report Status PENDING  Incomplete  Blood Culture (routine x 2)     Status: None (Preliminary result)   Collection Time: 03/25/16  8:02 PM  Result Value Ref Range Status   Specimen Description BLOOD RIGHT ARM  Final   Special Requests AEROBIC BOTTLE ONLY 5CC  Final   Culture   Final    NO GROWTH 2 DAYS Performed at [facility] [address] [place], [place] [address]    Report Status PENDING  Incomplete     Labs: Basic Metabolic Panel:  Recent Labs Lab 03/25/16 1500 03/26/16 0222 03/27/16 0516 03/28/16 0452  NA 133* 133* 136 139  K 4.0 3.5 3.3* 3.8  CL 93* 98* 99* 104  CO2 28 27 28 27   GLUCOSE 121* 94 100* 95  BUN 22* 20 20 19   CREATININE 1.66* 1.58* 1.53* 1.62*  CALCIUM 8.5* 8.0* 8.3* 8.6*  MG  --  1.7 2.1 2.1   Liver Function Tests:  Recent Labs Lab  03/25/16 1500 03/26/16 0222 03/27/16 0516 03/28/16 0452  AST 19 16 18 17   ALT 10* 9* 9* 10*  ALKPHOS 70 55 60 60  BILITOT 1.3* 0.9 1.5* 1.3*  PROT 6.1* 5.0* 5.2* 5.1*  ALBUMIN 3.1* 2.5* 2.6* 2.6*    Recent Labs Lab  03/25/16 1500 03/26/16 0222  LIPASE 68* 74*   No results for input(s): AMMONIA in the last 168 hours. CBC:  Recent Labs Lab 03/25/16 1500 03/26/16 0222 03/26/16 0802 03/26/16 1447 03/27/16 0516 03/28/16 0452  WBC 18.5* 12.1* 11.2* 12.7* 9.6 9.1  NEUTROABS 17.1*  --   --   --  7.8*  --   HGB 10.6* 8.8* 8.4* 8.3* 10.0* 10.0*  HCT 31.6* 25.9* 25.4* 25.0* 29.5* 30.2*  MCV 99.7 101.6* 100.8* 100.8* 99.7 100.3*  PLT 225 181 179 176 175 172   Cardiac Enzymes:  Recent Labs Lab 03/25/16 2009 03/26/16 0222 03/26/16 0802  TROPONINI <0.03 <0.03 <0.03   BNP: BNP (last 3 results)  Recent Labs  03/26/16 1627  BNP 425.1*    ProBNP (last 3 results) No results for input(s): PROBNP in the last 8760 hours.  CBG:  Recent Labs Lab 03/25/16 1323  GLUCAP 126*       Signed:  Nita Sells MD   [facility] 03/28/2016, 10:38 AM

## 2016-03-28 NOTE — Progress Notes (Signed)
03/28/16  1230  Reviewed discharge instructions with patient and his sons. Patient verbalized understanding of discharge instruction. Copy of discharge instructions given to sons.

## 2016-03-28 NOTE — Care Management Note (Signed)
Case Management Note  Patient Details  Name: [name] MRN: [id number] Date of Birth: Apr 28, 1940  Subjective/Objective: [facility] rep Kim aware of d/c today, & HHPT ordered, & 3n1, rw to deliver to rm prior d/c. Provided w/private sitter list as resource.                   Action/Plan:d/c home w/HHC/DME   Expected Discharge Date:  03/28/16               Expected Discharge Plan:  [place]  In-House Referral:     Discharge planning Services  CM Consult  Post Acute Care Choice:    Choice offered to:  Spouse  DME Arranged:  3-N-1, Walker rolling DME Agency:     HH Arranged:  PT HH Agency:  [facility]  Status of Service:  Completed, signed off  If discussed at [place] of Stay Meetings, dates discussed:    Additional Comments:  Dessa Phi, RN 03/28/2016, 10:53 AM

## 2016-03-28 NOTE — Progress Notes (Signed)
Occupational Therapy Treatment Patient Details Name: [name] MRN: [id number] DOB: October 30, 1940 Today's Date: 03/28/2016    History of present illness [name] is a 76 y.o. male with a history of CAD and chronic atrial fibrillation , diverticulosis, PUD, and bladder cancer diagnosed in 2017 , usual hematuriT URP with  Cystoscopy on 03/12/2016. Admitted with  progressive weakness   OT comments  Pt overall S with ADL activity this OT visit. No family present for session. Pt reports he will have A at home as needed  Follow Up Recommendations  No OT follow up    Equipment Recommendations  3 in 1 bedside commode    Recommendations for Other Services      Precautions / Restrictions Precautions Precautions: Fall Precaution Comments: incontinence of bloody urine- a lot, wear pullups       Mobility Bed Mobility Overal bed mobility: Needs Assistance Bed Mobility: Supine to Sit     Supine to sit: Supervision        Transfers Overall transfer level: Needs assistance Equipment used: Rolling walker (2 wheeled) Transfers: Sit to/from [facility] Sit to Stand: Supervision Stand pivot transfers: Supervision                ADL Overall ADL's : Needs assistance/impaired             Lower Body Bathing: Supervison/ safety;Sit to/from stand;Cueing for safety   Upper Body Dressing : Set up;Sitting   Lower Body Dressing: Set up;Sit to/from stand;Cueing for safety   Toilet Transfer: Supervision/safety;RW;Ambulation;Cueing for sequencing;Regular Toilet   Toileting- Clothing Manipulation and Hygiene: Supervision/safety;Sit to/from stand;Cueing for safety   Tub/ Shower Transfer: Copy Details (indicate cue type and reason): verbalized safety  Functional mobility during ADLs: Supervision/safety;Cueing for safety;Rolling walker                  Cognition   Behavior During Therapy: WFL for tasks  assessed/performed Overall Cognitive Status: Within Functional Limits for tasks assessed                                    Pertinent Vitals/ Pain       Pain Assessment: No/denies pain         Frequency  Min 2X/week        Progress Toward Goals  OT Goals(current goals can now be found in the care plan section)  Progress towards OT goals: Progressing toward goals     Plan Discharge plan needs to be updated;Discharge plan remains appropriate    Co-evaluation                 End of Session Equipment Utilized During Treatment: Rolling walker   Activity Tolerance Patient tolerated treatment well   Patient Left with call bell/phone within reach;in chair;with chair alarm set   Nurse Communication Mobility status        Time: [id number] OT Time Calculation (min): 25 min  Charges: OT General Charges $OT Visit: 1 Procedure OT Treatments $Self Care/Home Management : 23-37 mins  [name], [name] 03/28/2016, 12:39 PM

## 2016-03-29 DIAGNOSIS — C679 Malignant neoplasm of bladder, unspecified: Secondary | ICD-10-CM | POA: Diagnosis not present

## 2016-03-29 DIAGNOSIS — I482 Chronic atrial fibrillation: Secondary | ICD-10-CM | POA: Diagnosis not present

## 2016-03-29 DIAGNOSIS — Z7901 Long term (current) use of anticoagulants: Secondary | ICD-10-CM | POA: Diagnosis not present

## 2016-03-29 DIAGNOSIS — I447 Left bundle-branch block, unspecified: Secondary | ICD-10-CM | POA: Diagnosis not present

## 2016-03-29 DIAGNOSIS — N183 Chronic kidney disease, stage 3 (moderate): Secondary | ICD-10-CM | POA: Diagnosis not present

## 2016-03-29 DIAGNOSIS — F1010 Alcohol abuse, uncomplicated: Secondary | ICD-10-CM | POA: Diagnosis not present

## 2016-03-29 DIAGNOSIS — Z87891 Personal history of nicotine dependence: Secondary | ICD-10-CM | POA: Diagnosis not present

## 2016-03-29 DIAGNOSIS — I2510 Atherosclerotic heart disease of native coronary artery without angina pectoris: Secondary | ICD-10-CM | POA: Diagnosis not present

## 2016-03-29 DIAGNOSIS — I129 Hypertensive chronic kidney disease with stage 1 through stage 4 chronic kidney disease, or unspecified chronic kidney disease: Secondary | ICD-10-CM | POA: Diagnosis not present

## 2016-03-29 DIAGNOSIS — I714 Abdominal aortic aneurysm, without rupture: Secondary | ICD-10-CM | POA: Diagnosis not present

## 2016-03-29 DIAGNOSIS — D62 Acute posthemorrhagic anemia: Secondary | ICD-10-CM | POA: Diagnosis not present

## 2016-03-29 DIAGNOSIS — N390 Urinary tract infection, site not specified: Secondary | ICD-10-CM | POA: Diagnosis not present

## 2016-03-30 LAB — CULTURE, BLOOD (ROUTINE X 2)
Culture: NO GROWTH
Culture: NO GROWTH

## 2016-04-04 DIAGNOSIS — I2510 Atherosclerotic heart disease of native coronary artery without angina pectoris: Secondary | ICD-10-CM | POA: Diagnosis not present

## 2016-04-04 DIAGNOSIS — R1110 Vomiting, unspecified: Secondary | ICD-10-CM | POA: Diagnosis not present

## 2016-04-04 DIAGNOSIS — F39 Unspecified mood [affective] disorder: Secondary | ICD-10-CM | POA: Diagnosis not present

## 2016-04-04 DIAGNOSIS — I482 Chronic atrial fibrillation: Secondary | ICD-10-CM | POA: Diagnosis not present

## 2016-04-04 DIAGNOSIS — M129 Arthropathy, unspecified: Secondary | ICD-10-CM | POA: Diagnosis not present

## 2016-04-04 DIAGNOSIS — C670 Malignant neoplasm of trigone of bladder: Secondary | ICD-10-CM | POA: Diagnosis not present

## 2016-04-04 DIAGNOSIS — I480 Paroxysmal atrial fibrillation: Secondary | ICD-10-CM | POA: Diagnosis not present

## 2016-04-04 DIAGNOSIS — R358 Other polyuria: Secondary | ICD-10-CM | POA: Diagnosis not present

## 2016-04-04 DIAGNOSIS — M6281 Muscle weakness (generalized): Secondary | ICD-10-CM | POA: Diagnosis not present

## 2016-04-04 DIAGNOSIS — M109 Gout, unspecified: Secondary | ICD-10-CM | POA: Diagnosis not present

## 2016-04-04 DIAGNOSIS — N390 Urinary tract infection, site not specified: Secondary | ICD-10-CM | POA: Diagnosis not present

## 2016-04-04 DIAGNOSIS — N183 Chronic kidney disease, stage 3 (moderate): Secondary | ICD-10-CM | POA: Diagnosis not present

## 2016-04-04 DIAGNOSIS — D62 Acute posthemorrhagic anemia: Secondary | ICD-10-CM | POA: Diagnosis not present

## 2016-04-04 DIAGNOSIS — Z933 Colostomy status: Secondary | ICD-10-CM | POA: Diagnosis not present

## 2016-04-05 DIAGNOSIS — R3912 Poor urinary stream: Secondary | ICD-10-CM | POA: Diagnosis not present

## 2016-04-10 ENCOUNTER — Other Ambulatory Visit (HOSPITAL_COMMUNITY): Payer: PPO

## 2016-04-10 ENCOUNTER — Inpatient Hospital Stay (HOSPITAL_COMMUNITY)
Admission: AD | Admit: 2016-04-10 | Discharge: 2016-04-17 | DRG: 682 | Disposition: A | Discharge status: Hospice | Payer: PPO | Source: Ambulatory Visit | Attending: Urology | Admitting: Urology

## 2016-04-10 ENCOUNTER — Inpatient Hospital Stay (HOSPITAL_COMMUNITY): Payer: PPO

## 2016-04-10 ENCOUNTER — Encounter (HOSPITAL_COMMUNITY): Payer: Self-pay | Admitting: Radiology

## 2016-04-10 DIAGNOSIS — I129 Hypertensive chronic kidney disease with stage 1 through stage 4 chronic kidney disease, or unspecified chronic kidney disease: Secondary | ICD-10-CM | POA: Diagnosis present

## 2016-04-10 DIAGNOSIS — R627 Adult failure to thrive: Secondary | ICD-10-CM | POA: Diagnosis present

## 2016-04-10 DIAGNOSIS — N184 Chronic kidney disease, stage 4 (severe): Secondary | ICD-10-CM | POA: Diagnosis not present

## 2016-04-10 DIAGNOSIS — N189 Chronic kidney disease, unspecified: Secondary | ICD-10-CM | POA: Diagnosis not present

## 2016-04-10 DIAGNOSIS — R1084 Generalized abdominal pain: Secondary | ICD-10-CM | POA: Diagnosis not present

## 2016-04-10 DIAGNOSIS — B952 Enterococcus as the cause of diseases classified elsewhere: Secondary | ICD-10-CM | POA: Diagnosis present

## 2016-04-10 DIAGNOSIS — F1020 Alcohol dependence, uncomplicated: Secondary | ICD-10-CM | POA: Diagnosis present

## 2016-04-10 DIAGNOSIS — N179 Acute kidney failure, unspecified: Secondary | ICD-10-CM | POA: Diagnosis not present

## 2016-04-10 DIAGNOSIS — Z8711 Personal history of peptic ulcer disease: Secondary | ICD-10-CM

## 2016-04-10 DIAGNOSIS — I252 Old myocardial infarction: Secondary | ICD-10-CM

## 2016-04-10 DIAGNOSIS — J90 Pleural effusion, not elsewhere classified: Secondary | ICD-10-CM | POA: Diagnosis present

## 2016-04-10 DIAGNOSIS — E860 Dehydration: Secondary | ICD-10-CM | POA: Diagnosis present

## 2016-04-10 DIAGNOSIS — Z66 Do not resuscitate: Secondary | ICD-10-CM | POA: Diagnosis present

## 2016-04-10 DIAGNOSIS — I447 Left bundle-branch block, unspecified: Secondary | ICD-10-CM | POA: Diagnosis present

## 2016-04-10 DIAGNOSIS — E785 Hyperlipidemia, unspecified: Secondary | ICD-10-CM | POA: Diagnosis not present

## 2016-04-10 DIAGNOSIS — I482 Chronic atrial fibrillation, unspecified: Secondary | ICD-10-CM

## 2016-04-10 DIAGNOSIS — J849 Interstitial pulmonary disease, unspecified: Secondary | ICD-10-CM | POA: Diagnosis present

## 2016-04-10 DIAGNOSIS — N132 Hydronephrosis with renal and ureteral calculous obstruction: Secondary | ICD-10-CM | POA: Diagnosis not present

## 2016-04-10 DIAGNOSIS — Z811 Family history of alcohol abuse and dependence: Secondary | ICD-10-CM

## 2016-04-10 DIAGNOSIS — N390 Urinary tract infection, site not specified: Secondary | ICD-10-CM | POA: Diagnosis not present

## 2016-04-10 DIAGNOSIS — K767 Hepatorenal syndrome: Secondary | ICD-10-CM | POA: Diagnosis not present

## 2016-04-10 DIAGNOSIS — D62 Acute posthemorrhagic anemia: Secondary | ICD-10-CM | POA: Diagnosis not present

## 2016-04-10 DIAGNOSIS — Z992 Dependence on renal dialysis: Secondary | ICD-10-CM | POA: Diagnosis not present

## 2016-04-10 DIAGNOSIS — I2510 Atherosclerotic heart disease of native coronary artery without angina pectoris: Secondary | ICD-10-CM | POA: Diagnosis present

## 2016-04-10 DIAGNOSIS — K7460 Unspecified cirrhosis of liver: Secondary | ICD-10-CM

## 2016-04-10 DIAGNOSIS — F329 Major depressive disorder, single episode, unspecified: Secondary | ICD-10-CM | POA: Diagnosis present

## 2016-04-10 DIAGNOSIS — N131 Hydronephrosis with ureteral stricture, not elsewhere classified: Secondary | ICD-10-CM | POA: Diagnosis not present

## 2016-04-10 DIAGNOSIS — R8299 Other abnormal findings in urine: Secondary | ICD-10-CM | POA: Diagnosis not present

## 2016-04-10 DIAGNOSIS — N111 Chronic obstructive pyelonephritis: Secondary | ICD-10-CM | POA: Diagnosis not present

## 2016-04-10 DIAGNOSIS — Z9079 Acquired absence of other genital organ(s): Secondary | ICD-10-CM

## 2016-04-10 DIAGNOSIS — K7031 Alcoholic cirrhosis of liver with ascites: Secondary | ICD-10-CM

## 2016-04-10 DIAGNOSIS — R180 Malignant ascites: Secondary | ICD-10-CM | POA: Diagnosis not present

## 2016-04-10 DIAGNOSIS — R600 Localized edema: Secondary | ICD-10-CM | POA: Diagnosis not present

## 2016-04-10 DIAGNOSIS — Z8551 Personal history of malignant neoplasm of bladder: Secondary | ICD-10-CM | POA: Diagnosis not present

## 2016-04-10 DIAGNOSIS — I700 Atherosclerosis of aorta: Secondary | ICD-10-CM | POA: Diagnosis present

## 2016-04-10 DIAGNOSIS — Z825 Family history of asthma and other chronic lower respiratory diseases: Secondary | ICD-10-CM

## 2016-04-10 DIAGNOSIS — N209 Urinary calculus, unspecified: Secondary | ICD-10-CM

## 2016-04-10 DIAGNOSIS — N183 Chronic kidney disease, stage 3 (moderate): Secondary | ICD-10-CM | POA: Diagnosis not present

## 2016-04-10 DIAGNOSIS — D649 Anemia, unspecified: Secondary | ICD-10-CM | POA: Diagnosis present

## 2016-04-10 DIAGNOSIS — R630 Anorexia: Secondary | ICD-10-CM | POA: Diagnosis present

## 2016-04-10 DIAGNOSIS — N1330 Unspecified hydronephrosis: Secondary | ICD-10-CM | POA: Diagnosis not present

## 2016-04-10 DIAGNOSIS — R188 Other ascites: Secondary | ICD-10-CM

## 2016-04-10 DIAGNOSIS — N170 Acute kidney failure with tubular necrosis: Secondary | ICD-10-CM | POA: Diagnosis not present

## 2016-04-10 DIAGNOSIS — Z87891 Personal history of nicotine dependence: Secondary | ICD-10-CM

## 2016-04-10 DIAGNOSIS — I7102 Dissection of abdominal aorta: Secondary | ICD-10-CM | POA: Diagnosis not present

## 2016-04-10 DIAGNOSIS — R319 Hematuria, unspecified: Secondary | ICD-10-CM | POA: Diagnosis not present

## 2016-04-10 DIAGNOSIS — K7030 Alcoholic cirrhosis of liver without ascites: Secondary | ICD-10-CM | POA: Diagnosis present

## 2016-04-10 DIAGNOSIS — Z7901 Long term (current) use of anticoagulants: Secondary | ICD-10-CM

## 2016-04-10 DIAGNOSIS — K279 Peptic ulcer, site unspecified, unspecified as acute or chronic, without hemorrhage or perforation: Secondary | ICD-10-CM | POA: Diagnosis present

## 2016-04-10 DIAGNOSIS — N181 Chronic kidney disease, stage 1: Secondary | ICD-10-CM | POA: Diagnosis not present

## 2016-04-10 DIAGNOSIS — F1010 Alcohol abuse, uncomplicated: Secondary | ICD-10-CM | POA: Diagnosis not present

## 2016-04-10 DIAGNOSIS — C661 Malignant neoplasm of right ureter: Secondary | ICD-10-CM | POA: Diagnosis present

## 2016-04-10 DIAGNOSIS — Z8249 Family history of ischemic heart disease and other diseases of the circulatory system: Secondary | ICD-10-CM

## 2016-04-10 DIAGNOSIS — R310 Gross hematuria: Secondary | ICD-10-CM | POA: Diagnosis present

## 2016-04-10 DIAGNOSIS — I714 Abdominal aortic aneurysm, without rupture: Secondary | ICD-10-CM | POA: Diagnosis not present

## 2016-04-10 DIAGNOSIS — R110 Nausea: Secondary | ICD-10-CM | POA: Diagnosis not present

## 2016-04-10 DIAGNOSIS — Z809 Family history of malignant neoplasm, unspecified: Secondary | ICD-10-CM

## 2016-04-10 DIAGNOSIS — Z9861 Coronary angioplasty status: Secondary | ICD-10-CM

## 2016-04-10 DIAGNOSIS — I739 Peripheral vascular disease, unspecified: Secondary | ICD-10-CM | POA: Diagnosis present

## 2016-04-10 DIAGNOSIS — I5030 Unspecified diastolic (congestive) heart failure: Secondary | ICD-10-CM

## 2016-04-10 DIAGNOSIS — Z6826 Body mass index (BMI) 26.0-26.9, adult: Secondary | ICD-10-CM

## 2016-04-10 DIAGNOSIS — C679 Malignant neoplasm of bladder, unspecified: Secondary | ICD-10-CM | POA: Diagnosis not present

## 2016-04-10 DIAGNOSIS — R109 Unspecified abdominal pain: Secondary | ICD-10-CM | POA: Diagnosis not present

## 2016-04-10 LAB — URINALYSIS, COMPLETE (UACMP) WITH MICROSCOPIC: Squamous Epithelial / LPF: NONE SEEN

## 2016-04-10 LAB — CBC WITH DIFFERENTIAL/PLATELET
Basophils Absolute: 0 10*3/uL (ref 0.0–0.1)
Basophils Relative: 0 %
EOS PCT: 0 %
Eosinophils Absolute: 0 10*3/uL (ref 0.0–0.7)
HEMATOCRIT: 37.6 % — AB (ref 39.0–52.0)
HEMOGLOBIN: 12.4 g/dL — AB (ref 13.0–17.0)
LYMPHS ABS: 0.9 10*3/uL (ref 0.7–4.0)
LYMPHS PCT: 7 %
MCH: 33.2 pg (ref 26.0–34.0)
MCHC: 33 g/dL (ref 30.0–36.0)
MCV: 100.5 fL — AB (ref 78.0–100.0)
Monocytes Absolute: 0.7 10*3/uL (ref 0.1–1.0)
Monocytes Relative: 5 %
NEUTROS ABS: 11.7 10*3/uL — AB (ref 1.7–7.7)
Neutrophils Relative %: 88 %
Platelets: 322 10*3/uL (ref 150–400)
RBC: 3.74 MIL/uL — AB (ref 4.22–5.81)
RDW: 16.3 % — ABNORMAL HIGH (ref 11.5–15.5)
WBC: 13.3 10*3/uL — AB (ref 4.0–10.5)

## 2016-04-10 LAB — BASIC METABOLIC PANEL
ANION GAP: 12 (ref 5–15)
BUN: 36 mg/dL — AB (ref 6–20)
CHLORIDE: 95 mmol/L — AB (ref 101–111)
CO2: 26 mmol/L (ref 22–32)
Calcium: 9.2 mg/dL (ref 8.9–10.3)
Creatinine, Ser: 1.92 mg/dL — ABNORMAL HIGH (ref 0.61–1.24)
GFR calc Af Amer: 37 mL/min — ABNORMAL LOW (ref >60)
GFR calc non Af Amer: 32 mL/min — ABNORMAL LOW (ref >60)
GLUCOSE: 116 mg/dL — AB (ref 65–99)
POTASSIUM: 4.5 mmol/L (ref 3.5–5.1)
Sodium: 133 mmol/L — ABNORMAL LOW (ref 135–145)

## 2016-04-10 LAB — HEPATIC FUNCTION PANEL
ALK PHOS: 85 U/L (ref 38–126)
ALT: 11 U/L — AB (ref 17–63)
AST: 17 U/L (ref 15–41)
Albumin: 3 g/dL — ABNORMAL LOW (ref 3.5–5.0)
Bilirubin, Direct: 0.2 mg/dL (ref 0.1–0.5)
Indirect Bilirubin: 1 mg/dL — ABNORMAL HIGH (ref 0.3–0.9)
Total Bilirubin: 1.2 mg/dL (ref 0.3–1.2)
Total Protein: 6.7 g/dL (ref 6.5–8.1)

## 2016-04-10 LAB — PROTIME-INR
INR: 1.23
Prothrombin Time: 15.6 seconds — ABNORMAL HIGH (ref 11.4–15.2)

## 2016-04-10 LAB — LACTIC ACID, PLASMA: LACTIC ACID, VENOUS: 1.8 mmol/L (ref 0.5–1.9)

## 2016-04-10 MED ORDER — SODIUM CHLORIDE 0.45 % IV SOLN
INTRAVENOUS | Status: DC
  Administered 2016-04-10 – 2016-04-11 (×2): via INTRAVENOUS

## 2016-04-10 MED ORDER — DIPHENHYDRAMINE HCL 50 MG/ML IJ SOLN: 12.5000 mg | Freq: Four times a day (QID) | INTRAMUSCULAR | Status: DC | PRN

## 2016-04-10 MED ORDER — ACETAMINOPHEN 325 MG PO TABS: 650.0000 mg | ORAL_TABLET | ORAL | Status: DC | PRN

## 2016-04-10 MED ORDER — DIPHENHYDRAMINE HCL 12.5 MG/5ML PO ELIX: 12.5000 mg | ORAL_SOLUTION | Freq: Four times a day (QID) | ORAL | Status: DC | PRN

## 2016-04-10 MED ORDER — DOXYCYCLINE HYCLATE 100 MG PO TABS
100.0000 mg | ORAL_TABLET | Freq: Two times a day (BID) | ORAL | Status: DC
  Administered 2016-04-10 – 2016-04-11 (×2): 100 mg via ORAL
  Filled 2016-04-10 (×3): qty 1

## 2016-04-10 MED ORDER — HYDROCODONE-ACETAMINOPHEN 5-325 MG PO TABS: 1.0000 | ORAL_TABLET | ORAL | Status: DC | PRN

## 2016-04-10 MED ORDER — ONDANSETRON HCL 4 MG/2ML IJ SOLN: 4.0000 mg | INTRAMUSCULAR | Status: DC | PRN

## 2016-04-10 MED ORDER — BISACODYL 5 MG PO TBEC: 5.0000 mg | DELAYED_RELEASE_TABLET | Freq: Every day | ORAL | Status: DC | PRN

## 2016-04-10 MED ORDER — SENNOSIDES-DOCUSATE SODIUM 8.6-50 MG PO TABS: 1.0000 | ORAL_TABLET | Freq: Every evening | ORAL | Status: DC | PRN

## 2016-04-10 MED ORDER — ZOLPIDEM TARTRATE 5 MG PO TABS: 5.0000 mg | ORAL_TABLET | Freq: Every evening | ORAL | Status: DC | PRN

## 2016-04-10 MED ORDER — FLEET ENEMA 7-19 GM/118ML RE ENEM: 1.0000 | ENEMA | Freq: Once | RECTAL | Status: DC | PRN

## 2016-04-10 NOTE — H&P (Signed)
History and Physical  Chief Complaint:  Failure to thrive. General abdominal pain.   History of Present Illness:      Office Visit Report     04/10/2016   --------------------------------------------------------------------------------   [name]. [name]  MRN: [id number]  PRIMARY CARE:  Montez Morita, MD  DOB: 1940/10/11, 76 year old Male  REFERRING:  Jimmey Ralph, NP  [id number]:  [name], M.D.    LOCATION:  [facility] [phone]   --------------------------------------------------------------------------------   CC: I have bladder cancer.  HPI: [name] is a 76 year-old male established patient who is here for bladder cancer.  His problem was diagnosed 05/26/2015. His bladder cancer was diagnosed by [facility]. His cancer was diagnosed at [facility]. The bladder cancer was found because of blood in his urine.   His bladder cancer was treated by removal with scope and chemotherapy. Patient denies removal of the entire bladder and radiation.   His last cysto was 07/28/2015.   He does have a good appetite. BOWEL HABITS: his bowels are moving normally. He is not having pain in new locations. He has not recently had unwanted weight loss.   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  Recent cysto in OR showed no evidence of Ca. Pt now doing poorly metabolically, and desires to forgo additionally BCG Rx.     CC: I have pain in the abdomen.  HPI: The problem is on both sides. His pain started about approximately 03/27/2016. The pain is dull. The intensity of his pain is rated as a 6. The pain is constant. The pain does not radiate.   None< makes the pain better. Walking makes the pain worse. He has not been treated  with any pain medications. He has not had this same pain previously.   post thulium laser prostate. Current enterococcus UTI, sensitive to doxycycline.      AUA Symptom Score: Less than 20% of the time he has the sensation of not emptying his bladder completely when finished urinating. Less than 50% of the time he has to urinate again fewer than two hours after he has finished urinating. Less than 20% of the time he has to start and stop again several times when he urinates. Less than 20% of the time he finds it difficult to postpone urination. 50% of the time he has a weak urinary stream. 50% of the time he has to push or strain to begin urination. He has to get up to urinate 2 times from the time he goes to bed until the time he gets up in the morning.   Calculated AUA Symptom Score: 13    IIEF-5 Score: The patient's confidence that he can get an erection is very low. The patient's erections were hard enough for penetration almost never or never.     ALLERGIES: Eggs    MEDICATIONS: Finasteride 5 mg tablet  Metoprolol Succinate 100 mg tablet, extended release 24 hr  Allopurinol 300 mg tablet Oral  Amlodipine Besylate 5 mg tablet Oral  Crestor 20 mg tablet Oral  Eliquis 5 mg tablet  Hyoscyamine Sulfate 0.125 mg tablet, sublingual  Lexapro  Pantoprazole Sodium 40 mg tablet, delayed release  Trimethoprim 100 mg tablet 1 tablet PO Daily     GU PSH:  Bladder Instill AntiCA Agent - 01/31/2016, 12/27/2015, 12/13/2015, 11/29/2015, 11/22/2015, 11/15/2015, 11/08/2015, 11/03/2015, 10/18/2015, 09/27/2015 Cysto Bladder Ureth Biopsy - 07/28/2015 Cystoscopy - 03/05/2016 Cystoscopy TURBT >5 cm - 06/06/2015 Laser Surgery Prostate - 03/12/2016      PSH Notes: Gastric Surgery, Colostomy Closure, Back Surgery   NON-GU PSH: Colostomy - 2015 Explore Carotid Artery - 2015    GU PMH: Bladder Cancer Trigone - 04/05/2016, - 03/05/2016, - 09/04/2015, Malignant neoplasm of trigone of urinary bladder, -  06/05/2015 BPH w/LUTS - 03/05/2016 Urinary Frequency - 03/05/2016 Urinary Hesitancy - 03/05/2016 Weak Urinary Stream - 03/05/2016 Bladder Cancer Lateral, Malignant neoplasm of lateral wall of urinary bladder - 06/05/2015 Bladder Cancer, Unspec, Primary transitional cell carcinoma of bladder - 06/05/2015 Gross hematuria, Gross hematuria - 05/15/2015 Other microscopic hematuria, Microscopic hematuria - 05/15/2015      PMH Notes:  2015-05-02 09:05:11 - Note: Lesions Penile Shaft   NON-GU PMH: Depression - 04/05/2016 Nutritional deficiency, unspecified - 04/05/2016 Other specified postprocedural states - 04/05/2016, Culture urine. With fatigue and nitrite (+) urine will empirically begin Cephalexin 500 mg 1 po BID X 7 days till culture complete. At this time hold TMP suppression ABX, - 03/19/2016 Encounter for general adult medical examination without abnormal findings, Encounter for preventive health examination - 06/05/2015 Myocardial Infarction, History of acute myocardial infarction - 2015 Personal history of other diseases of the circulatory system, History of cardiac disorder - 2015, History of atrial fibrillation, - 2015, History of hypertension, - 2015 Personal history of other diseases of the digestive system, History of gastric ulcer - 2015    FAMILY HISTORY: malignant neoplasm - Runs In Family   SOCIAL HISTORY: Marital Status: Married Current Smoking Status: Patient does not smoke anymore.     REVIEW OF SYSTEMS:    GU Review Male:   Patient reports hard to postpone urination and leakage of urine. Patient denies frequent urination, burning/ pain with urination, get up at night to urinate, stream starts and stops, trouble starting your stream, have to strain to urinate , erection problems, and penile pain.  Gastrointestinal (Upper):   Patient reports nausea and vomiting. Patient denies indigestion/ heartburn.  Gastrointestinal (Lower):   Patient reports constipation. Patient denies diarrhea.   Constitutional:   Patient reports fatigue and weight loss. Patient denies fever and night sweats.  Skin:   Patient denies skin rash/ lesion and itching.  Eyes:   Patient denies blurred vision and double vision.  Ears/ Nose/ Throat:   Patient denies sore throat and sinus problems.  Hematologic/Lymphatic:   Patient denies swollen glands and easy bruising.  Cardiovascular:   Patient denies leg swelling and chest pains.  Respiratory:   Patient reports shortness of breath. Patient denies cough.  Endocrine:   Patient denies excessive thirst.  Musculoskeletal:   Patient denies back pain and joint pain.  Neurological:   Patient denies headaches and dizziness.  Psychologic:   Patient denies depression and anxiety.   VITAL SIGNS:      04/10/2016 01:11 PM  BP 109/72 mmHg  Resp. Rate 16 /min   MULTI-SYSTEM PHYSICAL EXAMINATION:    Constitutional: Thin, poorly-nourished. Appears older than their stated age. Poor grooming. No physical deformities.   Neck: Neck symmetrical, not swollen. Normal tracheal position.  Respiratory: No labored breathing, no use of accessory muscles.   Cardiovascular: Normal temperature, normal extremity pulses, no swelling, no varicosities.  Lymphatic: No enlargement of neck, axillae, groin.  Skin: Skin has decreased turgor, poor hydration, pale. No jaundice, no cyanosis. No lesion, no  ulcer, no rash.   Neurologic / Psychiatric: Patient depressed. Oriented to time, oriented to place, oriented to person. No anxiety, no agitation.   Gastrointestinal: No mass, no tenderness, no rigidity, non obese abdomen.   Eyes: Normal conjunctivae. Normal eyelids.      PAST DATA REVIEWED:  Source Of History:  Patient   04/05/16  Urinalysis  Urine Appearance Cloudy   Urine Color Yellow   Urine Glucose Neg   Urine Bilirubin Neg   Urine Ketones Trace   Urine Specific Gravity 1.025   Urine Blood 3+   Urine pH 6.0   Urine Protein 2+   Urine Urobilinogen 0.2   Urine Nitrites Neg    Urine Leukocyte Esterase 3+   Urine WBC/hpf 20 - 40/hpf   Urine RBC/hpf 20 - 40/hpf   Urine Epithelial Cells 0 - 5/hpf   Urine Bacteria Few (10-25/hpf)   Urine Mucous Not Present   Urine Yeast NS (Not Seen)   Urine Trichomonas Not Present   Urine Cystals NS (Not Seen)   Urine Casts NS (Not Seen)   Urine Sperm Not Present    PROCEDURES:         Renal Ultrasound [id number]  Right Kidney: Length:10.54 cm Depth: 5.21 cm Cortical Width: 1.03 cm Width: 5.76 cm  Left Kidney: Length: 13.20 cm Depth: 5.04 cm Cortical Width: 1.27 cm Width: 7.01cm  Left Kidney/Ureter:  1)Mild hydro-----2)Lower Pole Cystic Area measuring 1.11cm x 0.76cm x 0.89cm  Right Kidney/Ureter:  1)Moderate to Severe Hydro-----2)Renal Pelvis measures 3.53cm-----3)Hypoechoic area along lower Pole border  Bladder:  PVR = 80.40ml      Bilateral Fluid Visualized - ? Ascites Ultrasound performed in Wheelchair   ASSESSMENT:      ICD-10 Details  1 GU:   Bladder Cancer Trigone - C67.0   2   Abdominal Pain Unspec - R10.9   3   BPH w/LUTS - N40.1   4   Urinary Frequency - R35.0   5 NON-GU:   Depression - F32.9   6   Personal history of other diseases of the circulatory system - Z86.79           Notes:   Gabreil is a 76 yo male with high grade non-invasive TCC bladder. He has not tolerated BCG well. He developed bladder outlet obstruction and is now post thulium laser vaporization of the prostate. He has become depressed, and was briefly hospitalized for IV fluids, refusing SNF placement. He is not drinking or eating at home. He was written for Lexapro per Dr. Dagmar Hait, but has not started medicine yet.  Note his "dirty" u/a, with urine c/s showing enterococcus faecalis, sensitive to doxycycline. Also sensitive to Cipro, and Levaquin, and Macrodantin.  He has had renal u/s showing bilateral hydro, R > L, and apparent ascites. He has continued to have elevated Cr > 2, preceding his thulium prostatectomy.  I have spoken with Dr. Vernard Gambles  of IR re: Right perc nephrostomy and sending fluid ( ascitic) for Cr determination to R/o urine vs ascites.     PLAN:           Schedule Return Visit/Planned Activity: Refer to Hospital ER  Return Notes: Emergency Admission, 4th floor          Document Letter(s):  Created for Patient: Clinical Summary   Study Result   CLINICAL DATA:  Hematuria. History of bladder cancer. Severe fatigue and difficulty ambulating for the past couple days.  EXAM: CT ABDOMEN AND PELVIS WITHOUT CONTRAST  TECHNIQUE: Multidetector CT imaging of the abdomen and pelvis was performed following the standard protocol without IV contrast.  COMPARISON:  05/10/2015  FINDINGS: Lower chest: The lung bases demonstrate small bilateral pleural effusions. Overlying basilar scarring changes and tiny calcifications could be due to interstitial lung disease or aspiration. No worrisome pulmonary lesions or acute overlying pulmonary process. The heart is upper limits of normal in size. No pericardial effusion. Dense three-vessel coronary artery calcifications are noted along with dense aortic calcifications.  Hepatobiliary: Advanced cirrhotic changes involving the liver. No obvious hepatic lesion or intrahepatic biliary dilatation. Findings are fairly markedly progressive since the prior CT scan. The gallbladder is mildly distended. Suspect clearing gallstones versus sludge. No common bile duct dilatation.  Pancreas: Mild diffuse atrophy of the pancreas but no mass, inflammation or ductal dilatation.  Spleen: Normal size.  No focal lesions.  Adrenals/Urinary Tract: The adrenal glands are unremarkable. Chronic but progressive right-sided hydroureteronephrosis. No focal renal lesion. No renal or obstructing ureteral calculi or bladder calculi.  The right distal ureter demonstrates increased density and thickening worrisome for distal ureteral tumor. Could not exclude right-sided bladder base tumor.  Moderate bladder wall thickening. The left ureter is normal.  Stomach/Bowel: The stomach, duodenum, small bowel and colon are grossly normal. No inflammatory changes, mass lesions or obstructive findings.  Vascular/Lymphatic: Advanced atherosclerotic calcifications involving the aorta and branch vessels. There is a infrarenal abdominal aortic aneurysm and chronic dissection. Maximum measurement is 3.6 cm. Possible focal penetrating ulcer. Advanced atherosclerotic calcifications involving both iliac arteries with chronic dissections.  Reproductive: Surgical changes involving the prostate gland. The seminal vesicles appear normal.  Other: No pelvic lymphadenopathy. Small amount of free pelvic fluid. Large amount of abdominal fluid.  Musculoskeletal: No significant bony findings.  IMPRESSION: 1. Progressive right-sided hydroureteronephrosis. Could not exclude a distal ureteral tumor or right bladder base tumor. Contrast may be helpful for further evaluation if clinically indicated. 2. Rapid progression of cirrhosis involving the liver with large volume ascites. No focal hepatic lesions or splenomegaly. 3. Bilateral pleural effusions with overlying interstitial lung disease or chronic aspiration. 4. Advanced atherosclerotic calcifications involving the aorta and branch vessels. Persistent infrarenal abdominal aortic aneurysm and chronic aortic and iliac artery dissections.   Electronically Signed   By: Marijo Sanes M.D.   On: 04/10/2016 16:22          Notes:   cc: Dr. Dagmar Hait    Signed by [name], M.D. on 04/10/16 at 5:47 PM (EST)     The information contained in this medical record document is considered private and confidential patient information. This information can only be used for the medical diagnosis and/or medical services that are being provided by the patient's selected caregivers. This information can only be distributed outside of the  patient's care if the patient agrees and signs waivers of authorization for this information to be sent to an outside source or route.   Past Medical History:  Diagnosis Date  . AAA (abdominal aortic aneurysm) ([place]) 02-25-2011   US abdomen 3.4 cm x 3 cm last US done per pt  . Anemia   . CAD (coronary artery disease)   . Cancer [facility])    Bladder cancer  . Chronic atrial fibrillation (HCC)    Dr. Angelena Form  . Duodenal ulcer   . Dysrhythmia    Atrial fibrillation-Dr. Angelena Form follows  . Gout   . Heart attack   . Heart murmur   . Hematuria    "bladder cancer"  . Hemorrhoids   .  History of blood transfusion    last -5 yrs ago "ulcer'  . History of GI bleed   . Hyperlipidemia   . Hypertension   . Macular degeneration, dry    BILATERAL  . Myocardial infarction    history of. 1983  . Renal insufficiency    Past Surgical History:  Procedure Laterality Date  . CARDIAC CATHETERIZATION     '83  . CAROTID ENDARTERECTOMY     bilaterally 2007  . CATARACT EXTRACTION Bilateral 2-3 yrs ago  . Cath/angioplasy     [id number], [facility] by Dr. Gilda Crease [facility] - [place] of angioplasty)  . COLOSTOMY N/A 04/07/2012   Procedure: COLOSTOMY;  Surgeon: Edward Jolly, MD;  Location: [facility];  Service: General;  Laterality: N/A;  Sigmoid Colectomy Colostomy   . COLOSTOMY TAKEDOWN N/A 09/30/2012   Procedure: TAKEDOWN OF HARTMAN COLOSTOMY;  Surgeon: Edward Jolly, MD;  Location: [facility];  Service: General;  Laterality: N/A;  . CYSTOSCOPY     done in Office -Dr. Gaynelle Arabian 05-15-15  . CYSTOSCOPY N/A 07/28/2015   Procedure: CYSTOSCOPY;  Surgeon: [name], MD;  Location: [facility];  Service: Urology;  Laterality: N/A;  . CYSTOSCOPY W/ URETERAL STENT PLACEMENT Right 05/26/2015   Procedure: CYSTOSCOPY WITH LEFT RETROGRADE PYELOGRAM/URETERAL ;  Surgeon: [name], MD;  Location: [facility];  Service: Urology;  Laterality: Right;  . ESOPHAGOGASTRODUODENOSCOPY  04/06/2011   Procedure:  ESOPHAGOGASTRODUODENOSCOPY (EGD);  Surgeon: Beryle Beams, MD;  Location: [facility];  Service: Endoscopy;  Laterality: N/A;  . EYE SURGERY  2011   bilateral  . LAMINECTOMY N/A 05/23/2012   Procedure: LUMBAR four-five LAMINECTOMY ;  Surgeon: Kristeen Miss, MD;  Location: [facility];  Service: Neurosurgery;  Laterality: N/A;  . No colonoscopy to date     ("I don't have a good excuse"). SOC reviewed  . PARTIAL COLECTOMY N/A 04/07/2012   Procedure: PARTIAL COLECTOMY;  Surgeon: Edward Jolly, MD;  Location: [facility];  Service: General;  Laterality: N/A;  Sigmoid Colectomy Colostomy hartman procedure  . pyloric stenosis    . TONSILLECTOMY  as child  . tooth extraction  09/24/2013   and dental implant  . TRANSURETHRAL RESECTION OF BLADDER TUMOR N/A 05/26/2015   Procedure: TRANSURETHRAL RESECTION OF BLADDER TUMOR (TURBT);  Surgeon: [name], MD;  Location: [facility];  Service: Urology;  Laterality: N/A;  . TRANSURETHRAL RESECTION OF BLADDER TUMOR N/A 07/28/2015   Procedure: TRANSURETHRAL RESECTION OF BLADDER  BIOPSY;  Surgeon: [name], MD;  Location: [facility];  Service: Urology;  Laterality: N/A;  . TRANSURETHRAL RESECTION OF BLADDER TUMOR N/A 03/12/2016   Procedure: POSSIBLE TRANSURETHRAL RESECTION OF BLADDER TUMOR (TURBT);  Surgeon: [name], MD;  Location: [facility];  Service: Urology;  Laterality: N/A;    Medications: I have reviewed the patient's current medications. Allergies:  Allergies  Allergen Reactions  . Eggs Or Egg-Derived Products Nausea And Vomiting    Family History  Problem Relation Age of Onset  . Heart attack Father   . Heart disease Father     After age 17  . Cancer Father   . Cancer Mother   . COPD Mother   . Emphysema Mother   . Obesity Sister   . Alcohol abuse Brother   . Colon cancer Neg Hx    Social History:  reports that he quit smoking about 35 years ago. His smoking use included Cigarettes. He has a 100.00 pack-year smoking  history. He has never used smokeless tobacco. He reports that he drinks  about 12.6 oz of alcohol per week . He reports that he does not use drugs.  ROS: All systems are reviewed and negative except as noted.   Physical Exam:  Vital signs in last 24 hours: Temp:  [97.4 F (36.3 C)] 97.4 F (36.3 C) (02/28 1521) Pulse Rate:  [68] 68 (02/28 1441) Resp:  [18] 18 (02/28 1441) BP: (156)/(91) 156/91 (02/28 1441) SpO2:  [99 %] 99 % (02/28 1441) Weight:  [77.6 kg (171 lb)] 77.6 kg (171 lb) (02/28 1441)  Cardiovascular: Skin warm; not flushed Respiratory: Breaths quiet; no shortness of breath Abdomen: No masses Neurological: Normal sensation to touch Musculoskeletal: Normal motor function arms and legs Lymphatics: No inguinal adenopathy Skin: No rashes Genitourinary: as above  Laboratory Data:  Results for orders placed or performed during the hospital encounter of 04/10/16 (from the past 24 hour(s))  Hepatic function panel     Status: Abnormal   Collection Time: 04/10/16  2:16 PM  Result Value Ref Range   Total Protein 6.7 6.5 - 8.1 g/dL   Albumin 3.0 (L) 3.5 - 5.0 g/dL   AST 17 15 - 41 U/L   ALT 11 (L) 17 - 63 U/L   Alkaline Phosphatase 85 38 - 126 U/L   Total Bilirubin 1.2 0.3 - 1.2 mg/dL   Bilirubin, Direct 0.2 0.1 - 0.5 mg/dL   Indirect Bilirubin 1.0 (H) 0.3 - 0.9 mg/dL  CBC WITH DIFFERENTIAL     Status: Abnormal   Collection Time: 04/10/16  2:16 PM  Result Value Ref Range   WBC 13.3 (H) 4.0 - 10.5 K/uL   RBC 3.74 (L) 4.22 - 5.81 MIL/uL   Hemoglobin 12.4 (L) 13.0 - 17.0 g/dL   HCT 37.6 (L) 39.0 - 52.0 %   MCV 100.5 (H) 78.0 - 100.0 fL   MCH 33.2 26.0 - 34.0 pg   MCHC 33.0 30.0 - 36.0 g/dL   RDW 16.3 (H) 11.5 - 15.5 %   Platelets 322 150 - 400 K/uL   Neutrophils Relative % 88 %   Neutro Abs 11.7 (H) 1.7 - 7.7 K/uL   Lymphocytes Relative 7 %   Lymphs Abs 0.9 0.7 - 4.0 K/uL   Monocytes Relative 5 %   Monocytes Absolute 0.7 0.1 - 1.0 K/uL   Eosinophils Relative 0 %    Eosinophils Absolute 0.0 0.0 - 0.7 K/uL   Basophils Relative 0 %   Basophils Absolute 0.0 0.0 - 0.1 K/uL  Basic metabolic panel     Status: Abnormal   Collection Time: 04/10/16  2:16 PM  Result Value Ref Range   Sodium 133 (L) 135 - 145 mmol/L   Potassium 4.5 3.5 - 5.1 mmol/L   Chloride 95 (L) 101 - 111 mmol/L   CO2 26 22 - 32 mmol/L   Glucose, Bld 116 (H) 65 - 99 mg/dL   BUN 36 (H) 6 - 20 mg/dL   Creatinine, Ser 1.92 (H) 0.61 - 1.24 mg/dL   Calcium 9.2 8.9 - 10.3 mg/dL   GFR calc non Af Amer 32 (L) >60 mL/min   GFR calc Af Amer 37 (L) >60 mL/min   Anion gap 12 5 - 15  Protime-INR     Status: Abnormal   Collection Time: 04/10/16  2:16 PM  Result Value Ref Range   Prothrombin Time 15.6 (H) 11.4 - 15.2 seconds   INR 1.23   Lactic acid, plasma     Status: None   Collection Time: 04/10/16  2:16 PM  Result  Value Ref Range   Lactic Acid, Venous 1.8 0.5 - 1.9 mmol/L   No results found for this or any previous visit (from the past 240 hour(s)). Creatinine:  Recent Labs  04/10/16 1416  CREATININE 1.92*    Xrays: See HPI  Impression/Assessment:  Right hydronephrosis                                                Ascites                                                UTI, sensitive to doxycycline                                                CKD, gfr 32 Cr 1.92 ( improved from 2.2)                                                A. Fibrillation, chronic ( on Elequis. Stopped In anticipation of right percutaneous                                                              nephrostomy                                                                                                   Anemia ( Hgb 12.3) Plan:   Hospitalize   GI evaluation  Madisynn Plair I Josclyn Rosales 04/10/2016, 5:49 PM

## 2016-04-11 ENCOUNTER — Inpatient Hospital Stay (HOSPITAL_COMMUNITY): Payer: PPO

## 2016-04-11 DIAGNOSIS — N181 Chronic kidney disease, stage 1: Secondary | ICD-10-CM

## 2016-04-11 DIAGNOSIS — N189 Chronic kidney disease, unspecified: Secondary | ICD-10-CM

## 2016-04-11 DIAGNOSIS — R188 Other ascites: Secondary | ICD-10-CM

## 2016-04-11 DIAGNOSIS — K7460 Unspecified cirrhosis of liver: Secondary | ICD-10-CM

## 2016-04-11 DIAGNOSIS — N111 Chronic obstructive pyelonephritis: Secondary | ICD-10-CM

## 2016-04-11 DIAGNOSIS — N1330 Unspecified hydronephrosis: Secondary | ICD-10-CM

## 2016-04-11 DIAGNOSIS — N170 Acute kidney failure with tubular necrosis: Secondary | ICD-10-CM

## 2016-04-11 DIAGNOSIS — N179 Acute kidney failure, unspecified: Principal | ICD-10-CM

## 2016-04-11 LAB — HEPATIC FUNCTION PANEL
ALBUMIN: 2.5 g/dL — AB (ref 3.5–5.0)
ALK PHOS: 71 U/L (ref 38–126)
ALT: 11 U/L — ABNORMAL LOW (ref 17–63)
AST: 14 U/L — ABNORMAL LOW (ref 15–41)
BILIRUBIN INDIRECT: 1.1 mg/dL — AB (ref 0.3–0.9)
Bilirubin, Direct: 0.2 mg/dL (ref 0.1–0.5)
TOTAL PROTEIN: 5.9 g/dL — AB (ref 6.5–8.1)
Total Bilirubin: 1.3 mg/dL — ABNORMAL HIGH (ref 0.3–1.2)

## 2016-04-11 LAB — CREATININE, FLUID (PLEURAL, PERITONEAL, JP DRAINAGE): CREAT FL: 1.9 mg/dL

## 2016-04-11 LAB — BODY FLUID CELL COUNT WITH DIFFERENTIAL
LYMPHS FL: 56 %
Monocyte-Macrophage-Serous Fluid: 39 % — ABNORMAL LOW (ref 50–90)
Neutrophil Count, Fluid: 5 % (ref 0–25)
Total Nucleated Cell Count, Fluid: 286 cu mm (ref 0–1000)

## 2016-04-11 LAB — GRAM STAIN

## 2016-04-11 LAB — LACTATE DEHYDROGENASE, PLEURAL OR PERITONEAL FLUID: LD FL: 136 U/L — AB (ref 3–23)

## 2016-04-11 LAB — PROTEIN, PLEURAL OR PERITONEAL FLUID: TOTAL PROTEIN, FLUID: 3 g/dL

## 2016-04-11 LAB — FERRITIN: FERRITIN: 827 ng/mL — AB (ref 24–336)

## 2016-04-11 LAB — ALBUMIN, PLEURAL OR PERITONEAL FLUID: ALBUMIN FL: 1.8 g/dL

## 2016-04-11 LAB — GLUCOSE, PLEURAL OR PERITONEAL FLUID: GLUCOSE FL: 59 mg/dL

## 2016-04-11 MED ORDER — METOPROLOL SUCCINATE ER 100 MG PO TB24: 100.0000 mg | ORAL_TABLET | Freq: Every day | ORAL | Status: DC

## 2016-04-11 MED ORDER — METOPROLOL SUCCINATE ER 25 MG PO TB24
25.0000 mg | ORAL_TABLET | Freq: Every day | ORAL | Status: DC
  Administered 2016-04-11: 25 mg via ORAL
  Filled 2016-04-11: qty 1

## 2016-04-11 MED ORDER — HEPARIN SODIUM (PORCINE) 5000 UNIT/ML IJ SOLN
5000.0000 [IU] | Freq: Three times a day (TID) | INTRAMUSCULAR | Status: AC
  Administered 2016-04-11 – 2016-04-12 (×2): 5000 [IU] via SUBCUTANEOUS
  Filled 2016-04-11 (×2): qty 1

## 2016-04-11 MED ORDER — LORAZEPAM 1 MG PO TABS
1.0000 mg | ORAL_TABLET | Freq: Four times a day (QID) | ORAL | Status: AC | PRN
Start: 2016-04-11 — End: 2016-04-14

## 2016-04-11 MED ORDER — VITAMIN B-1 100 MG PO TABS
100.0000 mg | ORAL_TABLET | Freq: Every day | ORAL | Status: DC
  Administered 2016-04-12 – 2016-04-17 (×6): 100 mg via ORAL
  Filled 2016-04-11 (×6): qty 1

## 2016-04-11 MED ORDER — PANTOPRAZOLE SODIUM 40 MG PO TBEC
40.0000 mg | DELAYED_RELEASE_TABLET | Freq: Every day | ORAL | Status: DC
  Administered 2016-04-11 – 2016-04-17 (×7): 40 mg via ORAL
  Filled 2016-04-11 (×7): qty 1

## 2016-04-11 MED ORDER — FINASTERIDE 5 MG PO TABS
5.0000 mg | ORAL_TABLET | Freq: Every day | ORAL | Status: DC
  Administered 2016-04-11 – 2016-04-17 (×7): 5 mg via ORAL
  Filled 2016-04-11 (×7): qty 1

## 2016-04-11 MED ORDER — THIAMINE HCL 100 MG/ML IJ SOLN: 100.0000 mg | Freq: Every day | INTRAMUSCULAR | Status: DC

## 2016-04-11 MED ORDER — METOPROLOL SUCCINATE ER 25 MG PO TB24: 25.0000 mg | ORAL_TABLET | Freq: Every day | ORAL | Status: DC

## 2016-04-11 MED ORDER — SODIUM CHLORIDE 0.9 % IV SOLN
INTRAVENOUS | Status: DC
  Administered 2016-04-11 – 2016-04-13 (×3): via INTRAVENOUS

## 2016-04-11 MED ORDER — AMOXICILLIN 250 MG PO CAPS
500.0000 mg | ORAL_CAPSULE | Freq: Two times a day (BID) | ORAL | Status: DC
  Administered 2016-04-11 – 2016-04-17 (×12): 500 mg via ORAL
  Filled 2016-04-11 (×12): qty 2

## 2016-04-11 MED ORDER — ADULT MULTIVITAMIN W/MINERALS CH
1.0000 | ORAL_TABLET | Freq: Every day | ORAL | Status: DC
  Administered 2016-04-12 – 2016-04-17 (×6): 1 via ORAL
  Filled 2016-04-11 (×6): qty 1

## 2016-04-11 MED ORDER — FOLIC ACID 1 MG PO TABS
1.0000 mg | ORAL_TABLET | Freq: Every day | ORAL | Status: DC
  Administered 2016-04-12 – 2016-04-17 (×6): 1 mg via ORAL
  Filled 2016-04-11 (×6): qty 1

## 2016-04-11 MED ORDER — LORAZEPAM 2 MG/ML IJ SOLN: 1.0000 mg | Freq: Four times a day (QID) | INTRAMUSCULAR | Status: AC | PRN

## 2016-04-11 NOTE — Progress Notes (Signed)
Urology Progress Note  1. TCC bladder: High grade, non-invasive, post BCG  Rx ( poorly tolerated). O:  Now with elevated Cr ( axotemia, ckd), wit non-contrast CT showing possible tumor in the Right lower ureter. A: Awaiting antegrade nephrostogram, to see if he has tumor in the Right ureter.  P: Antegrade nephrostogram co-incidental with percutaneous nephrostomy.                                       2. Elevated Cr. Was 2.2- now 1.92, with gfr  32 ( CKD 3B)       No acute urologic events overnight. Ambulation:   negative Flatus:    positive Bowel movement  negative  Pain: Back pain.   Objective:  Blood pressure 116/73, pulse 88, temperature 98.6 F (37 C), temperature source Oral, resp. rate 18, height 5\' 8"  (1.727 m), weight 77.6 kg (171 lb), SpO2 98 %.  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  For perc nephrostomy Monday.

## 2016-04-11 NOTE — Consult Note (Addendum)
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 Angelena Form. bladder cancer diagnosed in 2017, status post  cystoscopy and thulium laser TURP on 03/12/2016, admitted 2/12-2/15, for UTI, hematuria, dehydration, weakness, acute blood loss anemia secondary to hematuria, status post needing one unit of packed red blood cells on 2/13, readmitted 2/18 , because of abdominal pain, failure to thrive, hydroureteronephrosis with worsening renal function. Medicine was requested for  medical management. Patient complaining primarily of abdominal pain, poor oral intake, right-sided flank pain, ultrasound suspicious for cirrhosis, gastroenterology was consulted and recommended paracentesis to rule out malignant ascites. Apparently has a history of heavy alcohol use. He has lost a lot of weight recently.      Review of Systems: negative for the following  Constitutional: Denies fever, chills, diaphoresis, appetite change and fatigue.  HEENT: Denies photophobia, eye pain, redness, hearing loss, ear pain, congestion, sore throat, rhinorrhea, sneezing, mouth sores, trouble swallowing, neck pain, neck stiffness and tinnitus.  Respiratory: Denies SOB, DOE, cough, chest tightness, and wheezing.  Cardiovascular: Denies chest pain, palpitations and leg swelling.  Gastrointestinal: Denies nausea, vomiting, positive for abdominal pain/distention, diarrhea, constipation, blood in stool   Genitourinary: Denies dysuria, urgency, frequency, hematuria, flank pain and difficulty urinating.  Musculoskeletal: Denies myalgias, back pain, joint swelling, arthralgias and gait  problem.  Skin: Denies pallor, rash and wound.  Neurological: Denies dizziness, seizures, syncope, weakness, light-headedness, numbness and headaches.  Hematological: Denies adenopathy. Easy bruising, personal or family bleeding history  Psychiatric/Behavioral: Denies suicidal ideation, mood changes, confusion, nervousness, sleep disturbance and agitation       Past Medical History:  Diagnosis Date  . AAA (abdominal aortic aneurysm) ([place]) 02-25-2011   US abdomen 3.4 cm x 3 cm last US done per pt  . Anemia   . CAD (coronary artery disease)   . Cancer [facility])    Bladder cancer  . Chronic atrial fibrillation (H[facility])    Dr. Angelena Form  . Duodenal ulcer   . Dysrhythmia    Atrial fibrillation-Dr. Angelena Form follows  . Gout   . Heart attack   . Heart murmur   . Hematuria    "bladder cancer"  . Hemorrhoids   . History of blood transfusion    last -5 yrs ago "ulcer'  . History of GI bleed   . Hyperlipidemia   . Hypertension   . Macular degeneration, dry    BILATERAL  . Myocardial infarction    history of. 1983  . Renal insufficiency      Past Surgical History:  Procedure Laterality Date  . CARDIAC CATHETERIZATION     '83  . CAROTID ENDARTERECTOMY     bilaterally 2007  . CATARACT EXTRACTION Bilateral 2-3 yrs ago  . Cath/angioplasy     [id number], [place] by Dr. Gilda Crease [facility] of angioplasty)  . COLOSTOMY N/A 04/07/2012   Procedure: COLOSTOMY;  Surgeon: Edward Jolly, MD;  Location: [facility];  Service: General;  Laterality: N/A;  Sigmoid Colectomy Colostomy   . COLOSTOMY TAKEDOWN N/A 09/30/2012   Procedure: TAKEDOWN OF HARTMAN COLOSTOMY;  Surgeon: Edward Jolly, MD;  Location: [facility];  Service: General;  Laterality: N/A;  . CYSTOSCOPY  done in Office -Dr. Gaynelle Arabian 05-15-15  . CYSTOSCOPY N/A 07/28/2015   Procedure: CYSTOSCOPY;  Surgeon: [name], MD;  Location: [facility];  Service: Urology;  Laterality: N/A;  . CYSTOSCOPY W/ URETERAL STENT PLACEMENT Right  05/26/2015   Procedure: CYSTOSCOPY WITH LEFT RETROGRADE PYELOGRAM/URETERAL ;  Surgeon: [name], MD;  Location: [facility];  Service: Urology;  Laterality: Right;  . ESOPHAGOGASTRODUODENOSCOPY  04/06/2011   Procedure: ESOPHAGOGASTRODUODENOSCOPY (EGD);  Surgeon: Beryle Beams, MD;  Location: [facility];  Service: Endoscopy;  Laterality: N/A;  . EYE SURGERY  2011   bilateral  . LAMINECTOMY N/A 05/23/2012   Procedure: LUMBAR four-five LAMINECTOMY ;  Surgeon: Kristeen Miss, MD;  Location: [facility];  Service: Neurosurgery;  Laterality: N/A;  . No colonoscopy to date     ("I don't have a good excuse"). SOC reviewed  . PARTIAL COLECTOMY N/A 04/07/2012   Procedure: PARTIAL COLECTOMY;  Surgeon: Edward Jolly, MD;  Location: [facility];  Service: General;  Laterality: N/A;  Sigmoid Colectomy Colostomy hartman procedure  . pyloric stenosis    . TONSILLECTOMY  as child  . tooth extraction  09/24/2013   and dental implant  . TRANSURETHRAL RESECTION OF BLADDER TUMOR N/A 05/26/2015   Procedure: TRANSURETHRAL RESECTION OF BLADDER TUMOR (TURBT);  Surgeon: [name], MD;  Location: [facility];  Service: Urology;  Laterality: N/A;  . TRANSURETHRAL RESECTION OF BLADDER TUMOR N/A 07/28/2015   Procedure: TRANSURETHRAL RESECTION OF BLADDER  BIOPSY;  Surgeon: [name], MD;  Location: [facility];  Service: Urology;  Laterality: N/A;  . TRANSURETHRAL RESECTION OF BLADDER TUMOR N/A 03/12/2016   Procedure: POSSIBLE TRANSURETHRAL RESECTION OF BLADDER TUMOR (TURBT);  Surgeon: [name], MD;  Location: [facility];  Service: Urology;  Laterality: N/A;      Social History:  reports that he quit smoking about 35 years ago. His smoking use included Cigarettes. He has a 100.00 pack-year smoking history. He has never used smokeless tobacco. He reports that he drinks about 12.6 oz of alcohol per week . He reports that he does not use drugs.    Allergies  Allergen Reactions  . Eggs Or Egg-Derived  Products Nausea And Vomiting    Family History  Problem Relation Age of Onset  . Heart attack Father   . Heart disease Father     After age 26  . Cancer Father   . Cancer Mother   . COPD Mother   . Emphysema Mother   . Obesity Sister   . Alcohol abuse Brother   . Colon cancer Neg Hx          Prior to Admission medications   Medication Sig Start Date End Date Taking? Authorizing Provider  amLODipine (NORVASC) 5 MG tablet Take 5 mg by mouth daily.    Yes Historical Provider, MD  dabigatran (PRADAXA) 150 MG CAPS capsule Take 150 mg by mouth 2 (two) times daily.   Yes Historical Provider, MD  escitalopram (LEXAPRO) 10 MG tablet Take 10 mg by mouth daily.   Yes Historical Provider, MD  finasteride (PROSCAR) 5 MG tablet Take 1 tablet (5 mg total) by mouth daily. 03/29/16  Yes Nita Sells, MD  metoprolol succinate (TOPROL-XL) 100 MG 24 hr tablet Take 100 mg by mouth daily. Take with or immediately following a meal.   Yes Historical Provider, MD  omeprazole (PRILOSEC) 20 MG capsule Take 20 mg by mouth daily.   Yes Historical Provider, MD  ondansetron (ZOFRAN) 4 MG tablet Take 4 mg by  mouth every 8 (eight) hours as needed for nausea or vomiting.   Yes Historical Provider, MD  rosuvastatin (CRESTOR) 20 MG tablet Take 20 mg by mouth 2 (two) times a week.    Yes Hendricks Limes, MD     Physical Exam: Vitals:   04/11/16 0618 04/11/16 1238 04/11/16 1450 04/11/16 1511  BP: (!) 116/58 124/71 131/79 116/73  Pulse: 86 88    Resp: 17 18    Temp: 97.6 F (36.4 C) 98.6 F (37 C)    TempSrc: Oral Oral    SpO2: 97% 98%    Weight:      Height:          Constitutional: NAD, calm, comfortable Vitals:   04/11/16 0618 04/11/16 1238 04/11/16 1450 04/11/16 1511  BP: (!) 116/58 124/71 131/79 116/73  Pulse: 86 88    Resp: 17 18    Temp: 97.6 F (36.4 C) 98.6 F (37 C)    TempSrc: Oral Oral    SpO2: 97% 98%    Weight:      Height:       Eyes: PERRL, lids and conjunctivae  normal ENMT: Mucous membranes are moist. Posterior pharynx clear of any exudate or lesions.Normal dentition.  Neck: normal, supple, no masses, no thyromegaly Respiratory: clear to auscultation bilaterally, no wheezing, no crackles. Normal respiratory effort. No accessory muscle use.  Cardiovascular: Regular rate and rhythm, no murmurs / rubs / gallops. No extremity edema. 2+ pedal pulses. No carotid bruits.  Abdomen:  Soft,nontender, mildly distended, right mid abdominal tenderness. BS active, Musculoskeletal: no clubbing / cyanosis. No joint deformity upper and lower extremities. Good ROM, no contractures. Normal muscle tone.  Skin: no rashes, lesions, ulcers. No induration Neurologic: CN 2-12 grossly intact. Sensation intact, DTR normal. Strength 5/5 in all 4.  Psychiatric: Normal judgment and insight. Alert and oriented x 3. Normal mood.     Labs on Admission: I have personally reviewed following labs and imaging studies  CBC:  Recent Labs Lab 04/10/16 1416  WBC 13.3*  NEUTROABS 11.7*  HGB 12.4*  HCT 37.6*  MCV 100.5*  PLT [id number]    Basic Metabolic Panel:  Recent Labs Lab 04/10/16 1416  NA 133*  K 4.5  CL 95*  CO2 26  GLUCOSE 116*  BUN 36*  CREATININE 1.92*  CALCIUM 9.2    GFR: Estimated Creatinine Clearance: 31.7 mL/min (by C-G formula based on SCr of 1.92 mg/dL (H)).  Liver Function Tests:  Recent Labs Lab 04/10/16 1416 04/11/16 1141  AST 17 14*  ALT 11* 11*  ALKPHOS 85 71  BILITOT 1.2 1.3*  PROT 6.7 5.9*  ALBUMIN 3.0* 2.5*   No results for input(s): LIPASE, AMYLASE in the last 168 hours. No results for input(s): AMMONIA in the last 168 hours.  Coagulation Profile:  Recent Labs Lab 04/10/16 1416  INR 1.23   No results for input(s): DDIMER in the last 72 hours.  Cardiac Enzymes: No results for input(s): CKTOTAL, CKMB, CKMBINDEX, TROPONINI in the last 168 hours.  BNP (last 3 results) No results for input(s): PROBNP in the last 8760  hours.  HbA1C: No results for input(s): HGBA1C in the last 72 hours. Lab Results  Component Value Date   HGBA1C 5.1 04/16/2011   HGBA1C 5.3 01/05/2008   HGBA1C 5.3 09/04/2006     CBG: No results for input(s): GLUCAP in the last 168 hours.  Lipid Profile: No results for input(s): CHOL, HDL, LDLCALC, TRIG, CHOLHDL, LDLDIRECT in the last 72 hours.  Thyroid Function Tests: No results for input(s): TSH, T4TOTAL, FREET4, T3FREE, THYROIDAB in the last 72 hours.  Anemia Panel:  Recent Labs  04/11/16 1141  FERRITIN 827*    Urine analysis:    Component Value Date/Time   COLORURINE RED (A) 04/10/2016 1838   APPEARANCEUR TURBID (A) 04/10/2016 1838   LABSPEC  04/10/2016 1838    TEST NOT REPORTED DUE TO COLOR INTERFERENCE OF URINE PIGMENT   PHURINE  04/10/2016 1838    TEST NOT REPORTED DUE TO COLOR INTERFERENCE OF URINE PIGMENT   GLUCOSEU (A) 04/10/2016 1838    TEST NOT REPORTED DUE TO COLOR INTERFERENCE OF URINE PIGMENT   GLUCOSEU NEGATIVE 08/23/2010 0750   HGBUR (A) 04/10/2016 1838    TEST NOT REPORTED DUE TO COLOR INTERFERENCE OF URINE PIGMENT   BILIRUBINUR (A) 04/10/2016 1838    TEST NOT REPORTED DUE TO COLOR INTERFERENCE OF URINE PIGMENT   KETONESUR (A) 04/10/2016 1838    TEST NOT REPORTED DUE TO COLOR INTERFERENCE OF URINE PIGMENT   PROTEINUR (A) 04/10/2016 1838    TEST NOT REPORTED DUE TO COLOR INTERFERENCE OF URINE PIGMENT   UROBILINOGEN 0.2 05/23/2012 1618   NITRITE (A) 04/10/2016 1838    TEST NOT REPORTED DUE TO COLOR INTERFERENCE OF URINE PIGMENT   LEUKOCYTESUR (A) 04/10/2016 1838    TEST NOT REPORTED DUE TO COLOR INTERFERENCE OF URINE PIGMENT    Sepsis Labs: @LABRCNTIP (procalcitonin:4,lacticidven:4) )No results found for this or any previous visit (from the past 240 hour(s)).       Radiological Exams on Admission: X-ray Chest Pa And Lateral  Result Date: 04/10/2016 CLINICAL DATA:  Patient reports weakness today. History of CHF, coronary artery disease,  atrial fibrillation, former smoker. EXAM: CHEST  2 VIEW COMPARISON:  Chest x-ray of March 25, 2016 FINDINGS: The lungs are well-expanded. There is stable blunting of the right lateral and posterior costophrenic angles. There is increase conspicuity of the right hilar region today. The cardiac silhouette remains enlarged. The pulmonary vascularity is not engorged. There is calcification in the wall of the AA or Dick arch and descending thoracic aorta. The bony thorax exhibits no acute abnormality. IMPRESSION: Increased conspicuity of the right hilar region today suggest lymphadenopathy but may be accentuated by the portable technique. There is stable chronic bronchitic changes with small bilateral pleural effusions versus pleural thickening. Stable cardiomegaly without pulmonary edema. The would be useful to evaluate the chest more completely with CT scanning if the patient can undergo the procedure. Thoracic aortic atherosclerosis. Electronically Signed   By: David  Martinique M.D.   On: 04/10/2016 15:02   Dg Chest Port 1 View  Result Date: 04/11/2016 CLINICAL DATA:  Ascites . EXAM: PORTABLE CHEST 1 VIEW COMPARISON:  04/10/2016. FINDINGS: Mediastinum is stable. Hilar fullness is again noted. This may be related to prominent pulmonary vascularity and rotation of the patient. A PA lateral chest x-ray suggested for further evaluation. Pulmonary hilar fullness remains contrast-enhanced CT can be obtained. Stable cardiomegaly. Mild basilar atelectasis. Small right pleural effusion. Mild biapical pleural thickening noted consistent scarring. No pneumothorax. No acute bony abnormality . IMPRESSION: 1. Mild right hilar fullness is again noted. This may be related to prominent pulmonary vascularity and rotation of the patient. PA and lateral chest x-ray is suggested for further evaluation. If pulmonary hilar fullness remains contrast-enhanced CT can be obtained for further evaluation. 2. Basilar subsegmental atelectasis.  Small right pleural effusion . Electronically Signed   By: Marcello Moores  Register   On: 04/11/2016 06:40  Ct Renal Stone Study  Result Date: 04/10/2016 CLINICAL DATA:  Hematuria. History of bladder cancer. Severe fatigue and difficulty ambulating for the past couple days. EXAM: CT ABDOMEN AND PELVIS WITHOUT CONTRAST TECHNIQUE: Multidetector CT imaging of the abdomen and pelvis was performed following the standard protocol without IV contrast. COMPARISON:  05/10/2015 FINDINGS: Lower chest: The lung bases demonstrate small bilateral pleural effusions. Overlying basilar scarring changes and tiny calcifications could be due to interstitial lung disease or aspiration. No worrisome pulmonary lesions or acute overlying pulmonary process. The heart is upper limits of normal in size. No pericardial effusion. Dense three-vessel coronary artery calcifications are noted along with dense aortic calcifications. Hepatobiliary: Advanced cirrhotic changes involving the liver. No obvious hepatic lesion or intrahepatic biliary dilatation. Findings are fairly markedly progressive since the prior CT scan. The gallbladder is mildly distended. Suspect clearing gallstones versus sludge. No common bile duct dilatation. Pancreas: Mild diffuse atrophy of the pancreas but no mass, inflammation or ductal dilatation. Spleen: Normal size.  No focal lesions. Adrenals/Urinary Tract: The adrenal glands are unremarkable. Chronic but progressive right-sided hydroureteronephrosis. No focal renal lesion. No renal or obstructing ureteral calculi or bladder calculi. The right distal ureter demonstrates increased density and thickening worrisome for distal ureteral tumor. Could not exclude right-sided bladder base tumor. Moderate bladder wall thickening. The left ureter is normal. Stomach/Bowel: The stomach, duodenum, small bowel and colon are grossly normal. No inflammatory changes, mass lesions or obstructive findings. Vascular/Lymphatic: Advanced  atherosclerotic calcifications involving the aorta and branch vessels. There is a infrarenal abdominal aortic aneurysm and chronic dissection. Maximum measurement is 3.6 cm. Possible focal penetrating ulcer. Advanced atherosclerotic calcifications involving both iliac arteries with chronic dissections. Reproductive: Surgical changes involving the prostate gland. The seminal vesicles appear normal. Other: No pelvic lymphadenopathy. Small amount of free pelvic fluid. Large amount of abdominal fluid. Musculoskeletal: No significant bony findings. IMPRESSION: 1. Progressive right-sided hydroureteronephrosis. Could not exclude a distal ureteral tumor or right bladder base tumor. Contrast may be helpful for further evaluation if clinically indicated. 2. Rapid progression of cirrhosis involving the liver with large volume ascites. No focal hepatic lesions or splenomegaly. 3. Bilateral pleural effusions with overlying interstitial lung disease or chronic aspiration. 4. Advanced atherosclerotic calcifications involving the aorta and branch vessels. Persistent infrarenal abdominal aortic aneurysm and chronic aortic and iliac artery dissections. Electronically Signed   By: Marijo Sanes M.D.   On: 04/10/2016 16:22   X-ray Chest Pa And Lateral  Result Date: 04/10/2016 CLINICAL DATA:  Patient reports weakness today. History of CHF, coronary artery disease, atrial fibrillation, former smoker. EXAM: CHEST  2 VIEW COMPARISON:  Chest x-ray of March 25, 2016 FINDINGS: The lungs are well-expanded. There is stable blunting of the right lateral and posterior costophrenic angles. There is increase conspicuity of the right hilar region today. The cardiac silhouette remains enlarged. The pulmonary vascularity is not engorged. There is calcification in the wall of the AA or Dick arch and descending thoracic aorta. The bony thorax exhibits no acute abnormality. IMPRESSION: Increased conspicuity of the right hilar region today suggest  lymphadenopathy but may be accentuated by the portable technique. There is stable chronic bronchitic changes with small bilateral pleural effusions versus pleural thickening. Stable cardiomegaly without pulmonary edema. The would be useful to evaluate the chest more completely with CT scanning if the patient can undergo the procedure. Thoracic aortic atherosclerosis. Electronically Signed   By: David  Martinique M.D.   On: 04/10/2016 15:02   Dg Chest [facility]  Result Date: 04/11/2016 CLINICAL DATA:  Ascites . EXAM: PORTABLE CHEST 1 VIEW COMPARISON:  04/10/2016. FINDINGS: Mediastinum is stable. Hilar fullness is again noted. This may be related to prominent pulmonary vascularity and rotation of the patient. A PA lateral chest x-ray suggested for further evaluation. Pulmonary hilar fullness remains contrast-enhanced CT can be obtained. Stable cardiomegaly. Mild basilar atelectasis. Small right pleural effusion. Mild biapical pleural thickening noted consistent scarring. No pneumothorax. No acute bony abnormality . IMPRESSION: 1. Mild right hilar fullness is again noted. This may be related to prominent pulmonary vascularity and rotation of the patient. PA and lateral chest x-ray is suggested for further evaluation. If pulmonary hilar fullness remains contrast-enhanced CT can be obtained for further evaluation. 2. Basilar subsegmental atelectasis. Small right pleural effusion . Electronically Signed   By: Marcello Moores  Register   On: 04/11/2016 06:40   Dg Abd Acute W/chest  Result Date: 03/25/2016 CLINICAL DATA:  Initial evaluation for acute vomiting, constipation. EXAM: DG ABDOMEN ACUTE W/ 1V CHEST COMPARISON:  Prior CT from 05/10/2015. FINDINGS: Mild cardiomegaly. Mediastinal silhouette within normal limits. Aortic atherosclerosis noted. Lungs normally inflated. Blunting of the bilateral costophrenic angles suggests small pleural effusions. Associated patchy and linear bibasilar opacities, favored to reflect  atelectasis. Superimposed infiltrates not excluded. No other focal infiltrates. No pulmonary edema. No pneumothorax. Bowel gas pattern within normal limits without evidence for obstruction or ileus. No free air. No abnormal bowel wall thickening. Moderate amount of retained stool within the colon. No soft tissue mass identified. Scattered vascular calcifications noted within the abdomen. Mild scoliosis with multilevel degenerative spondylolysis noted within the visualized spine. Advanced osteoarthritic changes noted about the hips. No acute osseous abnormality. IMPRESSION: 1. Nonobstructive bowel gas pattern with no radiographic evidence for acute intra-abdominal process. 2. Moderate amount of retained stool within the colon. 3. Small bilateral pleural effusions. Associated bibasilar opacities favored to reflect atelectasis, although possible infiltrates could be considered in the correct clinical setting. 4. Extensive aortic atherosclerosis. Electronically Signed   By: Jeannine Boga M.D.   On: 03/25/2016 14:53   Ct Renal Stone Study  Result Date: 04/10/2016 CLINICAL DATA:  Hematuria. History of bladder cancer. Severe fatigue and difficulty ambulating for the past couple days. EXAM: CT ABDOMEN AND PELVIS WITHOUT CONTRAST TECHNIQUE: Multidetector CT imaging of the abdomen and pelvis was performed following the standard protocol without IV contrast. COMPARISON:  05/10/2015 FINDINGS: Lower chest: The lung bases demonstrate small bilateral pleural effusions. Overlying basilar scarring changes and tiny calcifications could be due to interstitial lung disease or aspiration. No worrisome pulmonary lesions or acute overlying pulmonary process. The heart is upper limits of normal in size. No pericardial effusion. Dense three-vessel coronary artery calcifications are noted along with dense aortic calcifications. Hepatobiliary: Advanced cirrhotic changes involving the liver. No obvious hepatic lesion or intrahepatic  biliary dilatation. Findings are fairly markedly progressive since the prior CT scan. The gallbladder is mildly distended. Suspect clearing gallstones versus sludge. No common bile duct dilatation. Pancreas: Mild diffuse atrophy of the pancreas but no mass, inflammation or ductal dilatation. Spleen: Normal size.  No focal lesions. Adrenals/Urinary Tract: The adrenal glands are unremarkable. Chronic but progressive right-sided hydroureteronephrosis. No focal renal lesion. No renal or obstructing ureteral calculi or bladder calculi. The right distal ureter demonstrates increased density and thickening worrisome for distal ureteral tumor. Could not exclude right-sided bladder base tumor. Moderate bladder wall thickening. The left ureter is normal. Stomach/Bowel: The stomach, duodenum, small bowel and colon are grossly normal. No inflammatory changes, mass  lesions or obstructive findings. Vascular/Lymphatic: Advanced atherosclerotic calcifications involving the aorta and branch vessels. There is a infrarenal abdominal aortic aneurysm and chronic dissection. Maximum measurement is 3.6 cm. Possible focal penetrating ulcer. Advanced atherosclerotic calcifications involving both iliac arteries with chronic dissections. Reproductive: Surgical changes involving the prostate gland. The seminal vesicles appear normal. Other: No pelvic lymphadenopathy. Small amount of free pelvic fluid. Large amount of abdominal fluid. Musculoskeletal: No significant bony findings. IMPRESSION: 1. Progressive right-sided hydroureteronephrosis. Could not exclude a distal ureteral tumor or right bladder base tumor. Contrast may be helpful for further evaluation if clinically indicated. 2. Rapid progression of cirrhosis involving the liver with large volume ascites. No focal hepatic lesions or splenomegaly. 3. Bilateral pleural effusions with overlying interstitial lung disease or chronic aspiration. 4. Advanced atherosclerotic calcifications  involving the aorta and branch vessels. Persistent infrarenal abdominal aortic aneurysm and chronic aortic and iliac artery dissections. Electronically Signed   By: Marijo Sanes M.D.   On: 04/10/2016 16:22   US Abdomen Limited Ruq  Result Date: 03/26/2016 CLINICAL DATA:  76 year old male with elevated bilirubin. Bladder cancer. Hypertension. Initial encounter. EXAM: US ABDOMEN LIMITED - RIGHT UPPER QUADRANT COMPARISON:  05/10/2015 CT. FINDINGS: Gallbladder: Gallbladder sludge. Gallbladder wall thickening measuring up to 3.5 mm. Patient was not tender over the gallbladder during scanning per ultrasound technologist. Common bile duct: Diameter: 6.7 mm. Liver: Nodular contour raises possibility of cirrhosis without focal mass or intrahepatic biliary duct dilation. Ascites. Right-sided pleural effusion. Right-sided hydronephrosis. IMPRESSION: Gallbladder sludge. Gallbladder wall thickening measuring up to 3.5 mm. Patient was not tender over the gallbladder during scanning per ultrasound technologist. It is possible gallbladder wall thickening is related to metabolic abnormality or cirrhosis rather than cholecystitis. Nodular contour raises possibility of cirrhosis without focal mass or intrahepatic biliary duct dilation. Ascites. Right-sided pleural effusion. Right-sided hydronephrosis. Electronically Signed   By: Genia Del M.D.   On: 03/26/2016 09:56      EKG: Independently reviewed.  *Atrial fibrillation rate controlled  Assessment/Plan Principal Problem:   Acute on chronic renal failure (HCC), baseline creatinine 1.5 Likely secondary to obstructive uropathy Urology plans to place percutaneous nephrostomy tube after Pradaxa is washed out of the system Also started on gentle IV hydration disease and renal function improves Strict I's and O's     Chronic atrial fibrillation (HCC) Rate is controlled, continue low-dose beta blocker, dose reduced from 100 mg to 25 mg  Enterococcal UTI Will start  patient on amoxicillin  Likely alcoholic cirrhosis Agree with paracentesis Will defer evaluation of cirrhosis to GI  Coronary artery disease/history of MI Chest pain-free currently, continue low-dose beta blocker CADs/p MI and balloon angioplasty in 1980's, PVD s/p bilateral CEA in 2005: Followed by Dr. Angelena Form Echocardiogram 2/13 showed ?pasp, no WM anomaly  Peptic ulcer disease Will start patient on PPI  Alcohol dependence Will start patient on CIWA protocol   DVT prophylaxis: *Heparin subcutaneous  Code Status History      consults called:  Family Communication: Admission, patients condition and plan of care including tests being ordered have been discussed with the patient  who indicates understanding and agree with the plan and Code Status     Disposition plan: Further plan will depend as patient's clinical course evolves and further radiologic and laboratory data become available. Likely home when stable   At the time of admission, it appears that the appropriate admission status for this patient is INPATIENT .Thisis judged to be reasonable and necessary in order to provide the required  intensity of service to ensure the patient's safetygiven thepresenting symptoms, physical exam findings, and initial radiographic and laboratory data in the context of their chronic comorbidities.   Reyne Dumas MD [facility] Pager [phone]  If 7PM-7AM, please contact night-coverage [email or web address] Password [facility]  04/11/2016, 3:18 PM

## 2016-04-11 NOTE — Procedures (Signed)
PROCEDURE SUMMARY:  Successful US guided paracentesis from right lateral abdomen.  Yielded 1.2L of clear, yellow fluid.  No immediate complications.  Pt tolerated well.   Specimen was sent for labs.  Docia Barrier PA-C 04/11/2016 4:09 PM

## 2016-04-11 NOTE — Consult Note (Signed)
Chief Complaint: Patient was seen in consultation today for bladder cancer  Referring Physician(s):  Dr.Sigmund Gaynelle Arabian  Supervising Physician: Arne Cleveland  Patient Status: [facility] - In-pt  History of Present Illness: [name] is a 76 y.o. male with past medical history of CAD, a fib, HTN, HLD, MI, renal insufficiency, and bladder cancer who presents with abdominal pain.   Renal CT 03/31/16 shows: 1. Progressive right-sided hydroureteronephrosis. Could not exclude a distal ureteral tumor or right bladder base tumor. Contrast may be helpful for further evaluation if clinically indicated. 2. Rapid progression of cirrhosis involving the liver with large volume ascites. No focal hepatic lesions or splenomegaly. 3. Bilateral pleural effusions with overlying interstitial lung disease or chronic aspiration. 4. Advanced atherosclerotic calcifications involving the aorta and branch vessels. Persistent infrarenal abdominal aortic aneurysm and chronic aortic and iliac artery dissections.  IR consulted for right percutaneous nephrostomy tube placement at the request Dr. Gaynelle Arabian.   Patient is on Pradaxa and needs to be held for 3-5 days due to prolonged half-life with renal insufficiency.   Past Medical History:  Diagnosis Date  . AAA (abdominal aortic aneurysm) ([place]) 02-25-2011   US abdomen 3.4 cm x 3 cm last US done per pt  . Anemia   . CAD (coronary artery disease)   . Cancer [facility])    Bladder cancer  . Chronic atrial fibrillation (HCC)    Dr. Angelena Form  . Duodenal ulcer   . Dysrhythmia    Atrial fibrillation-Dr. Angelena Form follows  . Gout   . Heart attack   . Heart murmur   . Hematuria    "bladder cancer"  . Hemorrhoids   . History of blood transfusion    last -5 yrs ago "ulcer'  . History of GI bleed   . Hyperlipidemia   . Hypertension   . Macular degeneration, dry    BILATERAL  . Myocardial infarction    history of. 1983  . Renal insufficiency     Past  Surgical History:  Procedure Laterality Date  . CARDIAC CATHETERIZATION     '83  . CAROTID ENDARTERECTOMY     bilaterally 2007  . CATARACT EXTRACTION Bilateral 2-3 yrs ago  . Cath/angioplasy     [id number], [place] by Dr. Gilda Crease [facility] of angioplasty)  . COLOSTOMY N/A 04/07/2012   Procedure: COLOSTOMY;  Surgeon: Edward Jolly, MD;  Location: [facility];  Service: General;  Laterality: N/A;  Sigmoid Colectomy Colostomy   . COLOSTOMY TAKEDOWN N/A 09/30/2012   Procedure: TAKEDOWN OF HARTMAN COLOSTOMY;  Surgeon: Edward Jolly, MD;  Location: [facility];  Service: General;  Laterality: N/A;  . CYSTOSCOPY     done in Office -Dr. Gaynelle Arabian 05-15-15  . CYSTOSCOPY N/A 07/28/2015   Procedure: CYSTOSCOPY;  Surgeon: [name], MD;  Location: [facility];  Service: Urology;  Laterality: N/A;  . CYSTOSCOPY W/ URETERAL STENT PLACEMENT Right 05/26/2015   Procedure: CYSTOSCOPY WITH LEFT RETROGRADE PYELOGRAM/URETERAL ;  Surgeon: [name], MD;  Location: [facility];  Service: Urology;  Laterality: Right;  . ESOPHAGOGASTRODUODENOSCOPY  04/06/2011   Procedure: ESOPHAGOGASTRODUODENOSCOPY (EGD);  Surgeon: Beryle Beams, MD;  Location: [facility];  Service: Endoscopy;  Laterality: N/A;  . EYE SURGERY  2011   bilateral  . LAMINECTOMY N/A 05/23/2012   Procedure: LUMBAR four-five LAMINECTOMY ;  Surgeon: Kristeen Miss, MD;  Location: [facility];  Service: Neurosurgery;  Laterality: N/A;  . No colonoscopy to date     ("I don't have a good excuse"). [place]  reviewed  . PARTIAL COLECTOMY N/A 04/07/2012   Procedure: PARTIAL COLECTOMY;  Surgeon: Edward Jolly, MD;  [facility]: [facility];  Service: General;  Laterality: N/A;  Sigmoid Colectomy Colostomy hartman procedure  . pyloric stenosis    . TONSILLECTOMY  as child  . tooth extraction  09/24/2013   and dental implant  . TRANSURETHRAL RESECTION OF BLADDER TUMOR N/A 05/26/2015   Procedure: TRANSURETHRAL RESECTION OF BLADDER TUMOR (TURBT);  Surgeon: [name], MD;  Location: [facility];  Service: Urology;  Laterality: N/A;  . TRANSURETHRAL RESECTION OF BLADDER TUMOR N/A 07/28/2015   Procedure: TRANSURETHRAL RESECTION OF BLADDER  BIOPSY;  Surgeon: [name], MD;  [facility];  Service: Urology;  Laterality: N/A;  . TRANSURETHRAL RESECTION OF BLADDER TUMOR N/A 03/12/2016   Procedure: POSSIBLE TRANSURETHRAL RESECTION OF BLADDER TUMOR (TURBT);  Surgeon: [name], MD;  [facility];  Service: Urology;  Laterality: N/A;    Allergies: Eggs or egg-derived products  Medications: Prior to Admission medications   Medication Sig Start Date End Date Taking? Authorizing Provider  amLODipine (NORVASC) 5 MG tablet Take 5 mg by mouth daily.    Yes Historical Provider, MD  dabigatran (PRADAXA) 150 MG CAPS capsule Take 150 mg by mouth 2 (two) times daily.   Yes Historical Provider, MD  escitalopram (LEXAPRO) 10 MG tablet Take 10 mg by mouth daily.   Yes Historical Provider, MD  finasteride (PROSCAR) 5 MG tablet Take 1 tablet (5 mg total) by mouth daily. 03/29/16  Yes Nita Sells, MD  metoprolol succinate (TOPROL-XL) 100 MG 24 hr tablet Take 100 mg by mouth daily. Take with or immediately following a meal.   Yes Historical Provider, MD  omeprazole (PRILOSEC) 20 MG capsule Take 20 mg by mouth daily.   Yes Historical Provider, MD  ondansetron (ZOFRAN) 4 MG tablet Take 4 mg by mouth every 8 (eight) hours as needed for nausea or vomiting.   Yes Historical Provider, MD  rosuvastatin (CRESTOR) 20 MG tablet Take 20 mg by mouth 2 (two) times a week.    Yes Hendricks Limes, MD     Family History  Problem Relation Age of Onset  . Heart attack Father   . Heart disease Father     After age 76  . Cancer Father   . Cancer Mother   . COPD Mother   . Emphysema Mother   . Obesity Sister   . Alcohol abuse Brother   . Colon cancer Neg Hx     Social History   Social History  . Marital status: Married    Spouse name: N/A  .  Number of children: 3  . Years of education: N/A   Occupational History  . Stockbroker    Social History Main Topics  . Smoking status: Former Smoker    Packs/day: 4.00    Years: 25.00    Types: Cigarettes    Quit date: 02/11/1981  . Smokeless tobacco: Never Used  . Alcohol use 12.6 oz/week    21 Shots of liquor per week     Comment: daily 2-3 per day  . Drug use: No  . Sexual activity: Not Currently   Other Topics Concern  . None   Social History Narrative  . None    Review of Systems  Constitutional: Positive for appetite change. Negative for fatigue and fever.  Respiratory: Negative for cough and shortness of breath.   Cardiovascular: Negative for chest pain.  Gastrointestinal: Positive for abdominal pain.  Psychiatric/Behavioral:  Negative for behavioral problems and confusion.    Vital Signs: BP 116/73 (BP Location: Right Arm)   Pulse 88   Temp 98.6 F (37 C) (Oral)   Resp 18   Ht 5' 8"  (1.727 m)   Wt 171 lb (77.6 kg)   SpO2 98%   BMI 26.00 kg/m   Physical Exam  Constitutional: He is oriented to person, place, and time. He appears well-developed.  Cardiovascular: Normal rate, regular rhythm and normal heart sounds.   Pulmonary/Chest: Effort normal and breath sounds normal. No respiratory distress.  Abdominal: He exhibits distension.  Neurological: He is alert and oriented to person, place, and time.  Skin: Skin is warm and dry.  Psychiatric: He has a normal mood and affect. His behavior is normal. Judgment and thought content normal.  Nursing note and vitals reviewed.   Mallapati Score:  MD Evaluation Airway: WNL Heart: WNL Abdomen: WNL Chest/ Lungs: WNL ASA  Classification: 3 Mallampati/Airway Score: Two  Imaging: X-ray Chest Pa And Lateral  Result Date: 04/10/2016 CLINICAL DATA:  Patient reports weakness today. History of CHF, coronary artery disease, atrial fibrillation, former smoker. EXAM: CHEST  2 VIEW COMPARISON:  Chest x-ray of March 25, 2016 FINDINGS: The lungs are well-expanded. There is stable blunting of the right lateral and posterior costophrenic angles. There is increase conspicuity of the right hilar region today. The cardiac silhouette remains enlarged. The pulmonary vascularity is not engorged. There is calcification in the wall of the AA or Dick arch and descending thoracic aorta. The bony thorax exhibits no acute abnormality. IMPRESSION: Increased conspicuity of the right hilar region today suggest lymphadenopathy but may be accentuated by the portable technique. There is stable chronic bronchitic changes with small bilateral pleural effusions versus pleural thickening. Stable cardiomegaly without pulmonary edema. The would be useful to evaluate the chest more completely with CT scanning if the patient can undergo the procedure. Thoracic aortic atherosclerosis. Electronically Signed   By: David  Martinique M.D.   On: 04/10/2016 15:02   Dg Chest Port 1 View  Result Date: 04/11/2016 CLINICAL DATA:  Ascites . EXAM: PORTABLE CHEST 1 VIEW COMPARISON:  04/10/2016. FINDINGS: Mediastinum is stable. Hilar fullness is again noted. This may be related to prominent pulmonary vascularity and rotation of the patient. A PA lateral chest x-ray suggested for further evaluation. Pulmonary hilar fullness remains contrast-enhanced CT can be obtained. Stable cardiomegaly. Mild basilar atelectasis. Small right pleural effusion. Mild biapical pleural thickening noted consistent scarring. No pneumothorax. No acute bony abnormality . IMPRESSION: 1. Mild right hilar fullness is again noted. This may be related to prominent pulmonary vascularity and rotation of the patient. PA and lateral chest x-ray is suggested for further evaluation. If pulmonary hilar fullness remains contrast-enhanced CT can be obtained for further evaluation. 2. Basilar subsegmental atelectasis. Small right pleural effusion . Electronically Signed   By: Marcello Moores  Register   On: 04/11/2016  06:40   Dg Abd Acute W/chest  Result Date: 03/25/2016 CLINICAL DATA:  Initial evaluation for acute vomiting, constipation. EXAM: DG ABDOMEN ACUTE W/ 1V CHEST COMPARISON:  Prior CT from 05/10/2015. FINDINGS: Mild cardiomegaly. Mediastinal silhouette within normal limits. Aortic atherosclerosis noted. Lungs normally inflated. Blunting of the bilateral costophrenic angles suggests small pleural effusions. Associated patchy and linear bibasilar opacities, favored to reflect atelectasis. Superimposed infiltrates not excluded. No other focal infiltrates. No pulmonary edema. No pneumothorax. Bowel gas pattern within normal limits without evidence for obstruction or ileus. No free air. No abnormal bowel wall thickening. Moderate amount  of retained stool within the colon. No soft tissue mass identified. Scattered vascular calcifications noted within the abdomen. Mild scoliosis with multilevel degenerative spondylolysis noted within the visualized spine. Advanced osteoarthritic changes noted about the hips. No acute osseous abnormality. IMPRESSION: 1. Nonobstructive bowel gas pattern with no radiographic evidence for acute intra-abdominal process. 2. Moderate amount of retained stool within the colon. 3. Small bilateral pleural effusions. Associated bibasilar opacities favored to reflect atelectasis, although possible infiltrates could be considered in the correct clinical setting. 4. Extensive aortic atherosclerosis. Electronically Signed   By: Jeannine Boga M.D.   On: 03/25/2016 14:53   Ct Renal Stone Study  Result Date: 04/10/2016 CLINICAL DATA:  Hematuria. History of bladder cancer. Severe fatigue and difficulty ambulating for the past couple days. EXAM: CT ABDOMEN AND PELVIS WITHOUT CONTRAST TECHNIQUE: Multidetector CT imaging of the abdomen and pelvis was performed following the standard protocol without IV contrast. COMPARISON:  05/10/2015 FINDINGS: Lower chest: The lung bases demonstrate small bilateral  pleural effusions. Overlying basilar scarring changes and tiny calcifications could be due to interstitial lung disease or aspiration. No worrisome pulmonary lesions or acute overlying pulmonary process. The heart is upper limits of normal in size. No pericardial effusion. Dense three-vessel coronary artery calcifications are noted along with dense aortic calcifications. Hepatobiliary: Advanced cirrhotic changes involving the liver. No obvious hepatic lesion or intrahepatic biliary dilatation. Findings are fairly markedly progressive since the prior CT scan. The gallbladder is mildly distended. Suspect clearing gallstones versus sludge. No common bile duct dilatation. Pancreas: Mild diffuse atrophy of the pancreas but no mass, inflammation or ductal dilatation. Spleen: Normal size.  No focal lesions. Adrenals/Urinary Tract: The adrenal glands are unremarkable. Chronic but progressive right-sided hydroureteronephrosis. No focal renal lesion. No renal or obstructing ureteral calculi or bladder calculi. The right distal ureter demonstrates increased density and thickening worrisome for distal ureteral tumor. Could not exclude right-sided bladder base tumor. Moderate bladder wall thickening. The left ureter is normal. Stomach/Bowel: The stomach, duodenum, small bowel and colon are grossly normal. No inflammatory changes, mass lesions or obstructive findings. Vascular/Lymphatic: Advanced atherosclerotic calcifications involving the aorta and branch vessels. There is a infrarenal abdominal aortic aneurysm and chronic dissection. Maximum measurement is 3.6 cm. Possible focal penetrating ulcer. Advanced atherosclerotic calcifications involving both iliac arteries with chronic dissections. Reproductive: Surgical changes involving the prostate gland. The seminal vesicles appear normal. Other: No pelvic lymphadenopathy. Small amount of free pelvic fluid. Large amount of abdominal fluid. Musculoskeletal: No significant bony  findings. IMPRESSION: 1. Progressive right-sided hydroureteronephrosis. Could not exclude a distal ureteral tumor or right bladder base tumor. Contrast may be helpful for further evaluation if clinically indicated. 2. Rapid progression of cirrhosis involving the liver with large volume ascites. No focal hepatic lesions or splenomegaly. 3. Bilateral pleural effusions with overlying interstitial lung disease or chronic aspiration. 4. Advanced atherosclerotic calcifications involving the aorta and branch vessels. Persistent infrarenal abdominal aortic aneurysm and chronic aortic and iliac artery dissections. Electronically Signed   By: Marijo Sanes M.D.   On: 04/10/2016 16:22   US Abdomen Limited Ruq  Result Date: 03/26/2016 CLINICAL DATA:  76 year old male with elevated bilirubin. Bladder cancer. Hypertension. Initial encounter. EXAM: US ABDOMEN LIMITED - RIGHT UPPER QUADRANT COMPARISON:  05/10/2015 CT. FINDINGS: Gallbladder: Gallbladder sludge. Gallbladder wall thickening measuring up to 3.5 mm. Patient was not tender over the gallbladder during scanning per ultrasound technologist. Common bile duct: Diameter: 6.7 mm. Liver: Nodular contour raises possibility of cirrhosis without focal mass or intrahepatic biliary duct  dilation. Ascites. Right-sided pleural effusion. Right-sided hydronephrosis. IMPRESSION: Gallbladder sludge. Gallbladder wall thickening measuring up to 3.5 mm. Patient was not tender over the gallbladder during scanning per ultrasound technologist. It is possible gallbladder wall thickening is related to metabolic abnormality or cirrhosis rather than cholecystitis. Nodular contour raises possibility of cirrhosis without focal mass or intrahepatic biliary duct dilation. Ascites. Right-sided pleural effusion. Right-sided hydronephrosis. Electronically Signed   By: Genia Del M.D.   On: 03/26/2016 09:56    Labs:  CBC:  Recent Labs  03/26/16 1447 03/27/16 0516 03/28/16 0452  04/10/16 1416  WBC 12.7* 9.6 9.1 13.3*  HGB 8.3* 10.0* 10.0* 12.4*  HCT 25.0* 29.5* 30.2* 37.6*  PLT 176 175 172 322    COAGS:  Recent Labs  05/26/15 0605 07/28/15 0750 03/12/16 0625 04/10/16 1416  INR 1.05  --   --  1.23  APTT  --  32 30  --     BMP:  Recent Labs  03/26/16 0222 03/27/16 0516 03/28/16 0452 04/10/16 1416  NA 133* 136 139 133*  K 3.5 3.3* 3.8 4.5  CL 98* 99* 104 95*  CO2 27 28 27 26   GLUCOSE 94 100* 95 116*  BUN 20 20 19  36*  CALCIUM 8.0* 8.3* 8.6* 9.2  CREATININE 1.58* 1.53* 1.62* 1.92*  GFRNONAA 41* 42* 40* 32*  GFRAA 47* 49* 46* 37*    LIVER FUNCTION TESTS:  Recent Labs  03/27/16 0516 03/28/16 0452 04/10/16 1416 04/11/16 1141  BILITOT 1.5* 1.3* 1.2 1.3*  AST 18 17 17  14*  ALT 9* 10* 11* 11*  ALKPHOS 60 60 85 71  PROT 5.2* 5.1* 6.7 5.9*  ALBUMIN 2.6* 2.6* 3.0* 2.5*    TUMOR MARKERS: No results for input(s): AFPTM, CEA, CA199, CHROMGRNA in the last 8760 hours.  Assessment and Plan: 1. Progressive right-sided hydroureteronephrosis Patient with history of bladder cancer diagnosed in 05/2015 who presents with abdominal pain, distention, and flank pain. Patient found to have right hydroureteronephrosis.  IR consulted for R PCN.   Patient is on Pradaxa at home and given renal insufficiency will require 3-5 day washout period.  Met with patient and wife to discuss nephrostomy tube and obtain consent, however patient unwilling to sign at the time.  He would like more time to discuss with medical team prior to providing consent for tube placement.  Patient is aware procedure is currently anticipated Monday.   2.  Ascites Patient with history of increased alcohol use. Now with ascites and possible cirrhosis on imaging.  Request made for diagnostic paracentesis which was performed and is dictated separately.   Thank you for this interesting consult.  I greatly enjoyed meeting [name] and look forward to participating in their care.  A  copy of this report was sent to the requesting provider on this date.  Electronically Signed: Docia Barrier 04/11/2016, 3:55 PM   I spent a total of 40 Minutes    in face to face in clinical consultation, greater than 50% of which was counseling/coordinating care for right hydroureteronephrosis.

## 2016-04-11 NOTE — Care Management Note (Signed)
Case Management Note  Patient Details  Name: JAHARRI MLYNARCZYK MRN: [id number] Date of Birth: [birth year]  Subjective/Objective:  76 y/o m admitted w/Chronic obstructive pyelonephritis. Readmit 2/12-2/15/28.From home.                  Action/Plan:d/c plan home.   Expected Discharge Date:   (unknown)               Expected Discharge Plan:  Home/Self Care  In-House Referral:     Discharge planning Services  CM Consult  Post Acute Care Choice:    Choice offered to:     DME Arranged:    DME Agency:     HH Arranged:    HH Agency:     Status of Service:  In process, will continue to follow  If discussed at Long Length of Stay Meetings, dates discussed:    Additional Comments:  Dessa Phi, RN 04/11/2016, 10:52 AM

## 2016-04-11 NOTE — Consult Note (Signed)
Referring Provider: [facility] Primary Care Physician:  Tivis Ringer, MD Primary Gastroenterologist:  Scarlette Shorts,  MD  Reason for Consultation:  ? cirrhosis  ASSESSMENT AND PLAN:    27. 76 yo male diagnosed with bladder cancer April 2017, s/p TURP. Has been getting BCG. Admitted with now failure to thrive, abdominal pain,  Hydroureteronephrosis on imaging. Will need nephrostomy tube when pradaxa cleared from system probably 3 days with renal function).    2. New ascites, possible cirrhosis on imaging. No other signs of portal HTN. Need to make sure ascites not malignant given #1.  -Needs dx paracentesis. I spoke with IR, they feel comfortable doing it today. Fluid studies ordered. Will obtain serum albumin today to calculate SAAG. -workup for etiology. Suspect ETOH but will obtain liver labs for other etiologies.   3. Chronic AFIB, last dose of pradaxa was yesterday.   4. CKD 3/4. Cr 1.92, slightly worse than baseline  5. CAD / MI  6. Hx complicated diverticular disease / colostomy with takedown  7. Hx of PUD   HPI: [name] is a 76 y.o. male with atrial fibrillation on chronic anticoagulants, CAD s/p MI, new LBBB CKD4, AAA. He is known to [place] for a history of GI bleeding secondary to PUD, a history of complicated diverticulitis requiring [name] colectomy and colostomy s/p reversal.  He was diagnosed with bladder cancer in April 2017, s/p TURP with laser late January.  Getting BCG treatment. 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 Admitted 03/25/16 with weakness, nausea, vomiting, anemia from hematuria. Pradaxa held, given a unit of blood..   Patient readmitted yesterday with abdominal pain, poor PO intake. Hurts to lay on his right side. He has right hydronephrosis, UTI, and new ascites with suspicion of cirrhosis on u/s. No [place] of liver disease. Patient's wife in room. They disagree on his ETOH habits but per wife patient has consume 3-4 ETOH / day for  years.  He has lost a lot of weight recently. He spits up all the time after eating but doesn't endorse true nausea.    Past Medical History:  Diagnosis Date  . AAA (abdominal aortic aneurysm) ([place]) 02-25-2011   US abdomen 3.4 cm x 3 cm last US done per pt  . Anemia   . CAD (coronary artery disease)   . Cancer [facility])    Bladder cancer  . Chronic atrial fibrillation (HCC)    Dr. Angelena Form  . Duodenal ulcer   . Dysrhythmia    Atrial fibrillation-Dr. Angelena Form follows  . Gout   . Heart attack   . Heart murmur   . Hematuria    "bladder cancer"  . Hemorrhoids   . History of blood transfusion    last -5 yrs ago "ulcer'  . History of GI bleed   . Hyperlipidemia   . Hypertension   . Macular degeneration, dry    BILATERAL  . Myocardial infarction    history of. 1983  . Renal insufficiency     Past Surgical History:  Procedure Laterality Date  . CARDIAC CATHETERIZATION     '83  . CAROTID ENDARTERECTOMY     bilaterally 2007  . CATARACT EXTRACTION Bilateral 2-3 yrs ago  . Cath/angioplasy     [id number], [place] by Dr. Gilda Crease [facility] of angioplasty)  . COLOSTOMY N/A 04/07/2012   Procedure: COLOSTOMY;  Surgeon: Edward Jolly, MD;  Location: [facility];  Service: General;  Laterality: N/A;  Sigmoid Colectomy Colostomy   . COLOSTOMY TAKEDOWN N/A 09/30/2012  Procedure: TAKEDOWN OF HARTMAN COLOSTOMY;  Surgeon: Edward Jolly, MD;  Location: [facility];  Service: General;  Laterality: N/A;  . CYSTOSCOPY     done in Office -Dr. Gaynelle Arabian 05-15-15  . CYSTOSCOPY N/A 07/28/2015   Procedure: CYSTOSCOPY;  Surgeon: [name], MD;  Location: [facility];  Service: Urology;  Laterality: N/A;  . CYSTOSCOPY W/ URETERAL STENT PLACEMENT Right 05/26/2015   Procedure: CYSTOSCOPY WITH LEFT RETROGRADE PYELOGRAM/URETERAL ;  Surgeon: [name], MD;  Location: [facility];  Service: Urology;  Laterality: Right;  . ESOPHAGOGASTRODUODENOSCOPY  04/06/2011   Procedure: ESOPHAGOGASTRODUODENOSCOPY (EGD);   Surgeon: Beryle Beams, MD;  Location: [facility];  Service: Endoscopy;  Laterality: N/A;  . EYE SURGERY  2011   bilateral  . LAMINECTOMY N/A 05/23/2012   Procedure: LUMBAR four-five LAMINECTOMY ;  Surgeon: Kristeen Miss, MD;  Location: [facility];  Service: Neurosurgery;  Laterality: N/A;  . No colonoscopy to date     ("I don't have a good excuse"). SOC reviewed  . PARTIAL COLECTOMY N/A 04/07/2012   Procedure: PARTIAL COLECTOMY;  Surgeon: Edward Jolly, MD;  Location: [facility];  Service: General;  Laterality: N/A;  Sigmoid Colectomy Colostomy hartman procedure  . pyloric stenosis    . TONSILLECTOMY  as child  . tooth extraction  09/24/2013   and dental implant  . TRANSURETHRAL RESECTION OF BLADDER TUMOR N/A 05/26/2015   Procedure: TRANSURETHRAL RESECTION OF BLADDER TUMOR (TURBT);  Surgeon: [name], MD;  Location: [facility];  Service: Urology;  Laterality: N/A;  . TRANSURETHRAL RESECTION OF BLADDER TUMOR N/A 07/28/2015   Procedure: TRANSURETHRAL RESECTION OF BLADDER  BIOPSY;  Surgeon: [name], MD;  [facility];  Service: Urology;  Laterality: N/A;  . TRANSURETHRAL RESECTION OF BLADDER TUMOR N/A 03/12/2016   Procedure: POSSIBLE TRANSURETHRAL RESECTION OF BLADDER TUMOR (TURBT);  Surgeon: [name], MD;  Location: [facility];  Service: Urology;  Laterality: N/A;    Prior to Admission medications   Medication Sig Start Date End Date Taking? Authorizing Provider  amLODipine (NORVASC) 5 MG tablet Take 5 mg by mouth daily.    Yes Historical Provider, MD  dabigatran (PRADAXA) 150 MG CAPS capsule Take 150 mg by mouth 2 (two) times daily.   Yes Historical Provider, MD  escitalopram (LEXAPRO) 10 MG tablet Take 10 mg by mouth daily.   Yes Historical Provider, MD  finasteride (PROSCAR) 5 MG tablet Take 1 tablet (5 mg total) by mouth daily. 03/29/16  Yes Nita Sells, MD  metoprolol succinate (TOPROL-XL) 100 MG 24 hr tablet Take 100 mg by mouth daily. Take with or  immediately following a meal.   Yes Historical Provider, MD  omeprazole (PRILOSEC) 20 MG capsule Take 20 mg by mouth daily.   Yes Historical Provider, MD  ondansetron (ZOFRAN) 4 MG tablet Take 4 mg by mouth every 8 (eight) hours as needed for nausea or vomiting.   Yes Historical Provider, MD  rosuvastatin (CRESTOR) 20 MG tablet Take 20 mg by mouth 2 (two) times a week.    Yes Hendricks Limes, MD    Current Facility-Administered Medications  Medication Dose Route Frequency Provider Last Rate Last Dose  . 0.45 % sodium chloride infusion   Intravenous Continuous [name], MD 75 mL/hr at 04/11/16 0546    . acetaminophen (TYLENOL) tablet 650 mg  650 mg Oral Q4H PRN [name], MD      . bisacodyl (DULCOLAX) EC tablet 5 mg  5 mg Oral Daily PRN [name], MD      .  diphenhydrAMINE (BENADRYL) injection 12.5 mg  12.5 mg Intravenous Q6H PRN [name], MD       Or  . diphenhydrAMINE (BENADRYL) 12.5 MG/5ML elixir 12.5 mg  12.5 mg Oral Q6H PRN [name], MD      . doxycycline (VIBRA-TABS) tablet 100 mg  100 mg Oral Q12H [name], MD   100 mg at 04/10/16 2133  . HYDROcodone-acetaminophen (NORCO/VICODIN) 5-325 MG per tablet 1-2 tablet  1-2 tablet Oral Q4H PRN [name], MD      . ondansetron [facility]) injection 4 mg  4 mg Intravenous Q4H PRN [name], MD      . senna-docusate (Senokot-S) tablet 1 tablet  1 tablet Oral QHS PRN [name], MD      . sodium phosphate (FLEET) 7-19 GM/118ML enema 1 enema  1 enema Rectal Once PRN [name], MD      . zolpidem (AMBIEN) tablet 5 mg  5 mg Oral QHS PRN [name], MD       Facility-Administered Medications Ordered in Other Encounters  Medication Dose Route Frequency Provider Last Rate Last Dose  . polymixin-bacitracin (POLYSPORIN) ointment   Topical BID [name], MD        Allergies as of 04/10/2016 - Review Complete 04/10/2016  Allergen Reaction Noted  .  Eggs or egg-derived products Nausea And Vomiting 05/24/2012    Family History  Problem Relation Age of Onset  . Heart attack Father   . Heart disease Father     After age 55  . Cancer Father   . Cancer Mother   . COPD Mother   . Emphysema Mother   . Obesity Sister   . Alcohol abuse Brother   . Colon cancer Neg Hx     Social History   Social History  . Marital status: Married    Spouse name: N/A  . Number of children: 3  . Years of education: N/A   Occupational History  . Stockbroker    Social History Main Topics  . Smoking status: Former Smoker    Packs/day: 4.00    Years: 25.00    Types: Cigarettes    Quit date: 02/11/1981  . Smokeless tobacco: Never Used  . Alcohol use 12.6 oz/week    21 Shots of liquor per week     Comment: daily 2-3 per day  . Drug use: No  . Sexual activity: Not Currently   Other Topics Concern  . Not on file   Social History Narrative  . No narrative on file    Review of Systems: All systems reviewed and negative except where noted in HPI.  Physical Exam: Vital signs in last 24 hours: Temp:  [97.4 F (36.3 C)-97.7 F (36.5 C)] 97.6 F (36.4 C) (03/01 0618) Pulse Rate:  [68-86] 86 (03/01 0618) Resp:  [17-18] 17 (03/01 0618) BP: (108-156)/(58-91) 116/58 (03/01 0618) SpO2:  [97 %-99 %] 97 % (03/01 0618) Weight:  [171 lb (77.6 kg)] 171 lb (77.6 kg) (02/28 1441) Last BM Date: 04/11/16 General:   Alert,  thin,  cooperative white male in NAD Head:  Normocephalic and atraumatic. Eyes:  Sclera clear, no icterus.   Conjunctiva pink. Ears:  Normal auditory acuity. Nose:  No deformity, discharge,  or lesions. Mouth:  No deformity or lesions.   Neck:  Supple; no masses Lungs:  Clear throughout to auscultation.   No wheezes, crackles, or rhonchi.  Heart:  Regular rate and rhythm; no murmurs, clicks, rubs,  or gallops. Abdomen:  Soft,nontender, mildly  distended, right mid abdominal tenderness. BS active,   Rectal:  Deferred  Msk:   Symmetrical without gross deformities. . Pulses:  Normal pulses noted. Extremities:  Without clubbing or edema. Neurologic:  Alert and  oriented x4;  grossly normal neurologically. Skin:  Intact without significant lesions or rashes.. Psych:  Alert and cooperative. Normal mood and affect.  Intake/Output from previous day: 02/28 0701 - 03/01 0700 In: 1150 [P.O.:60; I.V.:1090] Out: 5 [Urine:5] Intake/Output this shift: No intake/output data recorded.  Lab Results:  Recent Labs  04/10/16 1416  WBC 13.3*  HGB 12.4*  HCT 37.6*  PLT 322   BMET  Recent Labs  04/10/16 1416  NA 133*  K 4.5  CL 95*  CO2 26  GLUCOSE 116*  BUN 36*  CREATININE 1.92*  CALCIUM 9.2   LFT  Recent Labs  04/10/16 1416  PROT 6.7  ALBUMIN 3.0*  AST 17  ALT 11*  ALKPHOS 85  BILITOT 1.2  BILIDIR 0.2  IBILI 1.0*   PT/INR  Recent Labs  04/10/16 1416  LABPROT 15.6*  INR 1.23    Studies/Results: X-ray Chest Pa And Lateral  Result Date: 04/10/2016 CLINICAL DATA:  Patient reports weakness today. History of CHF, coronary artery disease, atrial fibrillation, former smoker. EXAM: CHEST  2 VIEW COMPARISON:  Chest x-ray of March 25, 2016 FINDINGS: The lungs are well-expanded. There is stable blunting of the right lateral and posterior costophrenic angles. There is increase conspicuity of the right hilar region today. The cardiac silhouette remains enlarged. The pulmonary vascularity is not engorged. There is calcification in the wall of the AA or Dick arch and descending thoracic aorta. The bony thorax exhibits no acute abnormality. IMPRESSION: Increased conspicuity of the right hilar region today suggest lymphadenopathy but may be accentuated by the portable technique. There is stable chronic bronchitic changes with small bilateral pleural effusions versus pleural thickening. Stable cardiomegaly without pulmonary edema. The would be useful to evaluate the chest more completely with CT scanning if  the patient can undergo the procedure. Thoracic aortic atherosclerosis. Electronically Signed   By: David  Martinique M.D.   On: 04/10/2016 15:02   Dg Chest Port 1 View  Result Date: 04/11/2016 CLINICAL DATA:  Ascites . EXAM: PORTABLE CHEST 1 VIEW COMPARISON:  04/10/2016. FINDINGS: Mediastinum is stable. Hilar fullness is again noted. This may be related to prominent pulmonary vascularity and rotation of the patient. A PA lateral chest x-ray suggested for further evaluation. Pulmonary hilar fullness remains contrast-enhanced CT can be obtained. Stable cardiomegaly. Mild basilar atelectasis. Small right pleural effusion. Mild biapical pleural thickening noted consistent scarring. No pneumothorax. No acute bony abnormality . IMPRESSION: 1. Mild right hilar fullness is again noted. This may be related to prominent pulmonary vascularity and rotation of the patient. PA and lateral chest x-ray is suggested for further evaluation. If pulmonary hilar fullness remains contrast-enhanced CT can be obtained for further evaluation. 2. Basilar subsegmental atelectasis. Small right pleural effusion . Electronically Signed   By: Marcello Moores  Register   On: 04/11/2016 06:40   Ct Renal Stone Study  Result Date: 04/10/2016 CLINICAL DATA:  Hematuria. History of bladder cancer. Severe fatigue and difficulty ambulating for the past couple days. EXAM: CT ABDOMEN AND PELVIS WITHOUT CONTRAST TECHNIQUE: Multidetector CT imaging of the abdomen and pelvis was performed following the standard protocol without IV contrast. COMPARISON:  05/10/2015 FINDINGS: Lower chest: The lung bases demonstrate small bilateral pleural effusions. Overlying basilar scarring changes and tiny calcifications could be due to interstitial lung  disease or aspiration. No worrisome pulmonary lesions or acute overlying pulmonary process. The heart is upper limits of normal in size. No pericardial effusion. Dense three-vessel coronary artery calcifications are noted along  with dense aortic calcifications. Hepatobiliary: Advanced cirrhotic changes involving the liver. No obvious hepatic lesion or intrahepatic biliary dilatation. Findings are fairly markedly progressive since the prior CT scan. The gallbladder is mildly distended. Suspect clearing gallstones versus sludge. No common bile duct dilatation. Pancreas: Mild diffuse atrophy of the pancreas but no mass, inflammation or ductal dilatation. Spleen: Normal size.  No focal lesions. Adrenals/Urinary Tract: The adrenal glands are unremarkable. Chronic but progressive right-sided hydroureteronephrosis. No focal renal lesion. No renal or obstructing ureteral calculi or bladder calculi. The right distal ureter demonstrates increased density and thickening worrisome for distal ureteral tumor. Could not exclude right-sided bladder base tumor. Moderate bladder wall thickening. The left ureter is normal. Stomach/Bowel: The stomach, duodenum, small bowel and colon are grossly normal. No inflammatory changes, mass lesions or obstructive findings. Vascular/Lymphatic: Advanced atherosclerotic calcifications involving the aorta and branch vessels. There is a infrarenal abdominal aortic aneurysm and chronic dissection. Maximum measurement is 3.6 cm. Possible focal penetrating ulcer. Advanced atherosclerotic calcifications involving both iliac arteries with chronic dissections. Reproductive: Surgical changes involving the prostate gland. The seminal vesicles appear normal. Other: No pelvic lymphadenopathy. Small amount of free pelvic fluid. Large amount of abdominal fluid. Musculoskeletal: No significant bony findings. IMPRESSION: 1. Progressive right-sided hydroureteronephrosis. Could not exclude a distal ureteral tumor or right bladder base tumor. Contrast may be helpful for further evaluation if clinically indicated. 2. Rapid progression of cirrhosis involving the liver with large volume ascites. No focal hepatic lesions or splenomegaly. 3.  Bilateral pleural effusions with overlying interstitial lung disease or chronic aspiration. 4. Advanced atherosclerotic calcifications involving the aorta and branch vessels. Persistent infrarenal abdominal aortic aneurysm and chronic aortic and iliac artery dissections. Electronically Signed   By: Marijo Sanes M.D.   On: 04/10/2016 16:22     Tye Savoy, NP-C @  04/11/2016, 9:17 AM  Pager number [phone]

## 2016-04-12 DIAGNOSIS — N131 Hydronephrosis with ureteral stricture, not elsewhere classified: Secondary | ICD-10-CM

## 2016-04-12 DIAGNOSIS — I482 Chronic atrial fibrillation: Secondary | ICD-10-CM

## 2016-04-12 LAB — COMPREHENSIVE METABOLIC PANEL
ALBUMIN: 2.4 g/dL — AB (ref 3.5–5.0)
ALK PHOS: 64 U/L (ref 38–126)
ALT: 9 U/L — AB (ref 17–63)
AST: 15 U/L (ref 15–41)
Anion gap: 9 (ref 5–15)
BILIRUBIN TOTAL: 1.3 mg/dL — AB (ref 0.3–1.2)
BUN: 33 mg/dL — AB (ref 6–20)
CALCIUM: 8.3 mg/dL — AB (ref 8.9–10.3)
CO2: 22 mmol/L (ref 22–32)
CREATININE: 1.65 mg/dL — AB (ref 0.61–1.24)
Chloride: 103 mmol/L (ref 101–111)
GFR calc Af Amer: 45 mL/min — ABNORMAL LOW (ref >60)
GFR calc non Af Amer: 39 mL/min — ABNORMAL LOW (ref >60)
GLUCOSE: 84 mg/dL (ref 65–99)
Potassium: 4 mmol/L (ref 3.5–5.1)
SODIUM: 134 mmol/L — AB (ref 135–145)
TOTAL PROTEIN: 5.4 g/dL — AB (ref 6.5–8.1)

## 2016-04-12 LAB — PROTIME-INR
INR: 1.17
Prothrombin Time: 15 seconds (ref 11.4–15.2)

## 2016-04-12 LAB — FANA STAINING PATTERNS: Homogeneous Pattern: 1:80 {titer}

## 2016-04-12 LAB — IRON AND TIBC
IRON: 23 ug/dL — AB (ref 45–182)
Saturation Ratios: 12 % — ABNORMAL LOW (ref 17.9–39.5)
TIBC: 186 ug/dL — ABNORMAL LOW (ref 250–450)
UIBC: 163 ug/dL

## 2016-04-12 LAB — AMYLASE, PLEURAL OR PERITONEAL FLUID: Amylase, Fluid: 53 U/L

## 2016-04-12 LAB — ANTI-SMOOTH MUSCLE ANTIBODY, IGG: F-ACTIN AB IGG: 17 U (ref 0–19)

## 2016-04-12 LAB — AFP TUMOR MARKER: AFP TUMOR MARKER: 5.5 ng/mL (ref 0.0–8.3)

## 2016-04-12 LAB — HEPATITIS PANEL, ACUTE
HCV Ab: <0.1 s/co ratio (ref 0.0–0.9)
Hep A IgM: NEGATIVE
Hep B C IgM: NEGATIVE
Hepatitis B Surface Ag: NEGATIVE

## 2016-04-12 LAB — ANTINUCLEAR ANTIBODIES, IFA: ANA Ab, IFA: POSITIVE — AB

## 2016-04-12 MED ORDER — ZOLPIDEM TARTRATE 5 MG PO TABS: 5.0000 mg | ORAL_TABLET | Freq: Every evening | ORAL | Status: DC | PRN

## 2016-04-12 MED ORDER — CEFAZOLIN SODIUM-DEXTROSE 2-4 GM/100ML-% IV SOLN
2.0000 g | INTRAVENOUS | Status: AC
  Administered 2016-04-15: 2 g via INTRAVENOUS
  Filled 2016-04-12: qty 100

## 2016-04-12 MED ORDER — METOPROLOL SUCCINATE ER 50 MG PO TB24: 50.0000 mg | ORAL_TABLET | Freq: Every day | ORAL | Status: DC

## 2016-04-12 MED ORDER — MIRTAZAPINE 15 MG PO TABS
7.5000 mg | ORAL_TABLET | Freq: Every day | ORAL | Status: DC
  Filled 2016-04-12: qty 1

## 2016-04-12 MED ORDER — MIRTAZAPINE 15 MG PO TABS
7.5000 mg | ORAL_TABLET | Freq: Every day | ORAL | Status: DC
  Administered 2016-04-12 – 2016-04-16 (×5): 7.5 mg via ORAL
  Filled 2016-04-12 (×4): qty 1

## 2016-04-12 MED ORDER — METOPROLOL SUCCINATE ER 50 MG PO TB24
50.0000 mg | ORAL_TABLET | Freq: Every day | ORAL | Status: DC
  Administered 2016-04-12 – 2016-04-17 (×6): 50 mg via ORAL
  Filled 2016-04-12 (×6): qty 1

## 2016-04-12 MED ORDER — BISACODYL 5 MG PO TBEC: 5.0000 mg | DELAYED_RELEASE_TABLET | Freq: Every day | ORAL | Status: DC | PRN

## 2016-04-12 MED ORDER — POLYETHYLENE GLYCOL 3350 17 G PO PACK
17.0000 g | PACK | Freq: Every day | ORAL | Status: DC
  Administered 2016-04-12 – 2016-04-14 (×3): 17 g via ORAL
  Filled 2016-04-12 (×5): qty 1

## 2016-04-12 NOTE — Progress Notes (Signed)
[facility] Consult PROGRESS NOTE    Progress Note  [name]  [id number] DOB: 1940-07-25 DOA: 04/10/2016 PCP: Tivis Ringer, MD     Brief Narrative:   [name] is an 76 y.o. 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 Angelena Form. bladder cancer diagnosed in 2017, status post  cystoscopy and thulium laser TURP on 03/12/2016, admitted 2/12-2/15, for UTI, hematuria, dehydration, weakness, acute blood loss anemia secondary to hematuria, status post needing one unit of packed red blood cells on 2/13, readmitted 2/18 , because of abdominal pain, failure to thrive, hydroureteronephrosis with worsening renal function. Medicine was requested for  medical management  Assessment/Plan:   Generalized abdominal pain: CT scan of the abdomen and pelvis showed ascites cirrhosis and right hydronephrosis. Renal ultrasound was done that showed bilateral hydronephrosis right greater than left with an elevated creatinine. Urology recommended a right percutaneous nephrostomy tube to be placed by IR. He relates his pain is controlled. Will follow at a distance.  Acute on chronic renal failure [facility]): Likely secondary to bladder outlet obstruction, urology is to place nephrostomy tube after anticoagulant has been washed out. Continue strict I's and O's Creatinine is slowly improving. I am sure it will improve thereafter nephrostomy tube placement. Unclear reason why he is on amoxicillin. We'll deferred to urology.  Possible UTI: Management per urology.  Chronic atrial fibrillation (HCC) Mildly tachycardic will increase his metoprolol to 50 mg twice a day which is equivalent to his home dose. Continue to hold anticoagulation  to be resumed per GI and urology.  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 Further menegement per GI. SAAG < 1, awaiting cytology results  Coronary artery Disease: He's currently chest pain-free, continue beta blocker. Denies any chest management as above.  Peptic ulcer disease: Continue PPI.  Anorexia: Start remeron.   DVT prophylaxis: Heparin Family Communication:none Disposition Plan/Barrier to D/C: per urology Code Status:  Code Status History    Date Active Date Inactive Code Status Order ID Comments User Context   04/10/2016  1:39 PM 04/10/2016  2:01 PM Full Code [id number]  [name], MD Inpatient   03/25/2016  7:46 PM 03/28/2016  4:21 PM Full Code [id number]  Janece Canterbury, MD Inpatient   09/30/2012  1:04 PM 10/04/2012 12:19 PM Full Code [id number]  Excell Seltzer, MD Inpatient   05/23/2012  9:39 PM 05/24/2012  2:26 PM Full Code [id number]  Kristeen Miss, MD Inpatient   04/07/2012  4:31 PM 04/11/2012  3:00 PM Full Code [id number]  Edward Jolly, MD Inpatient   04/05/2011  5:27 PM 04/08/2011  1:43 PM Full Code [id number]  Nilda Calamity, RN Inpatient    Advance Directive Documentation   Flowsheet Row Most Recent Value  Type of Advance Directive  Healthcare Power of Attorney, Living will  Pre-existing out of facility DNR order (yellow form or pink MOST form)  No data  "MOST" Form in Place?  No data        IV Access:    Peripheral IV   Procedures and diagnostic studies:   X-ray Chest Pa And Lateral  Result Date: 04/10/2016 CLINICAL DATA:  Patient reports weakness today. History of CHF, coronary artery disease, atrial fibrillation, former smoker. EXAM: CHEST  2 VIEW COMPARISON:  Chest x-ray of March 25, 2016 FINDINGS: The lungs are well-expanded. There is  stable blunting of the right lateral and posterior costophrenic angles. There is increase conspicuity of the right hilar region today. The cardiac silhouette remains enlarged. The pulmonary vascularity is not engorged. There is calcification in the wall of the AA or Dick  arch and descending thoracic aorta. The bony thorax exhibits no acute abnormality. IMPRESSION: Increased conspicuity of the right hilar region today suggest lymphadenopathy but may be accentuated by the portable technique. There is stable chronic bronchitic changes with small bilateral pleural effusions versus pleural thickening. Stable cardiomegaly without pulmonary edema. The would be useful to evaluate the chest more completely with CT scanning if the patient can undergo the procedure. Thoracic aortic atherosclerosis. Electronically Signed   By: David  Martinique M.D.   On: 04/10/2016 15:02   Dg Chest Port 1 View  Result Date: 04/11/2016 CLINICAL DATA:  Ascites . EXAM: PORTABLE CHEST 1 VIEW COMPARISON:  04/10/2016. FINDINGS: Mediastinum is stable. Hilar fullness is again noted. This may be related to prominent pulmonary vascularity and rotation of the patient. A PA lateral chest x-ray suggested for further evaluation. Pulmonary hilar fullness remains contrast-enhanced CT can be obtained. Stable cardiomegaly. Mild basilar atelectasis. Small right pleural effusion. Mild biapical pleural thickening noted consistent scarring. No pneumothorax. No acute bony abnormality . IMPRESSION: 1. Mild right hilar fullness is again noted. This may be related to prominent pulmonary vascularity and rotation of the patient. PA and lateral chest x-ray is suggested for further evaluation. If pulmonary hilar fullness remains contrast-enhanced CT can be obtained for further evaluation. 2. Basilar subsegmental atelectasis. Small right pleural effusion . Electronically Signed   By: Marcello Moores  Register   On: 04/11/2016 06:40   Ct Renal Stone Study  Result Date: 04/10/2016 CLINICAL DATA:  Hematuria. History of bladder cancer. Severe fatigue and difficulty ambulating for the past couple days. EXAM: CT ABDOMEN AND PELVIS WITHOUT CONTRAST TECHNIQUE: Multidetector CT imaging of the abdomen and pelvis was performed following the standard  protocol without IV contrast. COMPARISON:  05/10/2015 FINDINGS: Lower chest: The lung bases demonstrate small bilateral pleural effusions. Overlying basilar scarring changes and tiny calcifications could be due to interstitial lung disease or aspiration. No worrisome pulmonary lesions or acute overlying pulmonary process. The heart is upper limits of normal in size. No pericardial effusion. Dense three-vessel coronary artery calcifications are noted along with dense aortic calcifications. Hepatobiliary: Advanced cirrhotic changes involving the liver. No obvious hepatic lesion or intrahepatic biliary dilatation. Findings are fairly markedly progressive since the prior CT scan. The gallbladder is mildly distended. Suspect clearing gallstones versus sludge. No common bile duct dilatation. Pancreas: Mild diffuse atrophy of the pancreas but no mass, inflammation or ductal dilatation. Spleen: Normal size.  No focal lesions. Adrenals/Urinary Tract: The adrenal glands are unremarkable. Chronic but progressive right-sided hydroureteronephrosis. No focal renal lesion. No renal or obstructing ureteral calculi or bladder calculi. The right distal ureter demonstrates increased density and thickening worrisome for distal ureteral tumor. Could not exclude right-sided bladder base tumor. Moderate bladder wall thickening. The left ureter is normal. Stomach/Bowel: The stomach, duodenum, small bowel and colon are grossly normal. No inflammatory changes, mass lesions or obstructive findings. Vascular/Lymphatic: Advanced atherosclerotic calcifications involving the aorta and branch vessels. There is a infrarenal abdominal aortic aneurysm and chronic dissection. Maximum measurement is 3.6 cm. Possible focal penetrating ulcer. Advanced atherosclerotic calcifications involving both iliac arteries with chronic dissections. Reproductive: Surgical changes involving the prostate gland. The seminal vesicles appear normal. Other: No pelvic  lymphadenopathy. Small amount of free pelvic fluid. Large amount  of abdominal fluid. Musculoskeletal: No significant bony findings. IMPRESSION: 1. Progressive right-sided hydroureteronephrosis. Could not exclude a distal ureteral tumor or right bladder base tumor. Contrast may be helpful for further evaluation if clinically indicated. 2. Rapid progression of cirrhosis involving the liver with large volume ascites. No focal hepatic lesions or splenomegaly. 3. Bilateral pleural effusions with overlying interstitial lung disease or chronic aspiration. 4. Advanced atherosclerotic calcifications involving the aorta and branch vessels. Persistent infrarenal abdominal aortic aneurysm and chronic aortic and iliac artery dissections. Electronically Signed   By: Marijo Sanes M.D.   On: 04/10/2016 16:22     Medical Consultants:    None.  Anti-Infectives:   Amocixillin and ancef  Subjective:    [name] he relates his pain is controlled. He relates is is not in a good mood to talk.  Objective:    Vitals:   04/11/16 1450 04/11/16 1511 04/11/16 2048 04/12/16 0546  BP: 131/79 116/73 (!) 99/46 119/77  Pulse:   85 (!) 113  Resp:   16 16  Temp:   98.5 F (36.9 C) 97.9 F (36.6 C)  TempSrc:   Oral Oral  SpO2:   98% 98%  Weight:      Height:        Intake/Output Summary (Last 24 hours) at 04/12/16 0818 Last data filed at 04/12/16 0700  Gross per 24 hour  Intake             1800 ml  Output                0 ml  Net             1800 ml   Filed Weights   04/10/16 1441  Weight: 77.6 kg (171 lb)    Exam: General exam: In no acute distress. Respiratory system:  Cardiovascular system: Gastrointestinal system:  Extremities: No pedal edema. Skin: No rashes, lesions or ulcers Psychiatry: Judgement and insight appear normal. Mood & affect appropriate.    Data Reviewed:    Labs: Basic Metabolic Panel:  Recent Labs Lab 04/10/16 1416 04/12/16 0549  NA 133* 134*  K 4.5 4.0  CL  95* 103  CO2 26 22  GLUCOSE 116* 84  BUN 36* 33*  CREATININE 1.92* 1.65*  CALCIUM 9.2 8.3*   GFR Estimated Creatinine Clearance: 36.8 mL/min (by C-G formula based on SCr of 1.65 mg/dL (H)). Liver Function Tests:  Recent Labs Lab 04/10/16 1416 04/11/16 1141 04/12/16 0549  AST 17 14* 15  ALT 11* 11* 9*  ALKPHOS 85 71 64  BILITOT 1.2 1.3* 1.3*  PROT 6.7 5.9* 5.4*  ALBUMIN 3.0* 2.5* 2.4*   No results for input(s): LIPASE, AMYLASE in the last 168 hours. No results for input(s): AMMONIA in the last 168 hours. Coagulation profile  Recent Labs Lab 04/10/16 1416 04/12/16 0549  INR 1.23 1.17    CBC:  Recent Labs Lab 04/10/16 1416  WBC 13.3*  NEUTROABS 11.7*  HGB 12.4*  HCT 37.6*  MCV 100.5*  PLT 322   Cardiac Enzymes: No results for input(s): CKTOTAL, CKMB, CKMBINDEX, TROPONINI in the last 168 hours. BNP (last 3 results) No results for input(s): PROBNP in the last 8760 hours. CBG: No results for input(s): GLUCAP in the last 168 hours. D-Dimer: No results for input(s): DDIMER in the last 72 hours. Hgb A1c: No results for input(s): HGBA1C in the last 72 hours. Lipid Profile: No results for input(s): CHOL, HDL, LDLCALC, TRIG, CHOLHDL, LDLDIRECT in the last 72  hours. Thyroid function studies: No results for input(s): TSH, T4TOTAL, T3FREE, THYROIDAB in the last 72 hours.  Invalid input(s): FREET3 Anemia work up:  Recent Labs  04/11/16 1141 04/11/16 2119  FERRITIN 827*  --   TIBC  --  186*  IRON  --  23*   Sepsis Labs:  Recent Labs Lab 04/10/16 1416  WBC 13.3*  LATICACIDVEN 1.8   Microbiology Recent Results (from the past 240 hour(s))  Gram stain     Status: None   Collection Time: 04/11/16  2:50 PM  Result Value Ref Range Status   Specimen Description FLUID PERITONEAL  Final   Special Requests NONE  Final   Gram Stain   Final    CYTOSPIN SMEAR WBC PRESENT,BOTH PMN AND MONONUCLEAR NO ORGANISMS SEEN Performed at [facility], [address], [place], [place] [address]    Report Status 04/11/2016 FINAL  Final     Medications:   . amoxicillin  500 mg Oral Q12H  . finasteride  5 mg Oral Daily  . folic acid  1 mg Oral Daily  . heparin subcutaneous  5,000 Units Subcutaneous Q8H  . metoprolol succinate  25 mg Oral Daily  . metoprolol succinate  25 mg Oral Daily  . multivitamin with minerals  1 tablet Oral Daily  . pantoprazole  40 mg Oral Daily  . polyethylene glycol  17 g Oral Daily  . thiamine  100 mg Oral Daily   Continuous Infusions: . sodium chloride 100 mL/hr at 04/12/16 0234    Time spent:25 min   LOS: 2 days   Charlynne Cousins  [facility] Pager [phone]  *Please refer to [email or web address], password [email or web address] to get updated schedule on who will round on this patient, as hospitalists switch teams weekly. If 7PM-7AM, please contact night-coverage at [email or web address], password [email or web address] for any overnight needs.  04/12/2016, 8:18 AM

## 2016-04-12 NOTE — Progress Notes (Signed)
Patient ID: [name], male   DOB: Sep 21, 1940, 76 y.o.   MRN: [id number] Pt seen in consultation yesterday for right PCN placement. At that time he was undecided about proceeding with case. After further discussion today with Dr. Darrelyn Hillock service he has decided to proceed with case on 3/5. Risks and benefits discussed with the patient/spouse including, but not limited to infection, bleeding, significant bleeding causing loss or decrease in renal function or damage to adjacent structures. All of the patient's questions were answered, patient is agreeable to proceed.Consent signed and in chart.Cont to hold pradaxa until after PCN.

## 2016-04-12 NOTE — Progress Notes (Signed)
Urology Progress Note  1. 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 Awaiting Right perc nephrostomy wit antegrade nephrostogram for evaluation. Cr: 1.65 today ( was 1.92 yesterday, and 2.2 peak)                                         2. Aki: dehydration with Cr 2.2, now 1.65                                         3. ckd 3b  gfr 32 yesaterday., gfr 39 today                                         4. UTI: : + c/s from office-pan sensitive, initial Doxy, changed to Rocephin per IM                                         5. A. Fib: off anticoag, awaiting R perc nephrostomy Monday.                                          6. Cirrhosis: awaiting Cr on paracentesis ascitic fluid to R/o urine. ? ETOH cirrhosis. Appears to be 1.9, therefore, not urine-ascites. Await GI f/u.                                          7. Transferrin level: ask IM for explanation.                                          8. PT evaluation.        No acute urologic events overnight. Ambulation:   negative Flatus:    negative Bowel movement  negative  Pain: some relief  Objective:  Blood pressure 119/77, pulse (!) 113, temperature 97.9 F (36.6 C), temperature source Oral, resp. rate 16, height 5\' 8"  (1.727 m), weight 77.6 kg (171 lb), SpO2 98 %.  Physical Exam:  General:  No acute distress, awake Extremities: extremities normal, atraumatic, no cyanosis or edema Genitourinary:  neg Foley: nione    I/O last 3 completed shifts: In: 2760 [P.O.:60; I.V.:2700] Out: 5 [Urine:5]  Recent Labs     04/10/16  1416  HGB  12.4*  WBC  13.3*  PLT  322    Recent Labs     04/10/16  1416  04/12/16  0549  NA  133*  134*  K  4.5  4.0  CL  95*  103  CO2  26  22  BUN  36*  33*  CREATININE  1.92*  1.65*  CALCIUM  9.2  8.3*  GFRNONAA  32*  39*  GFRAA  37*  45*     Recent Labs     04/10/16  1416  04/12/16  0549  INR  1.23  1.17     Invalid input(s): ABG  Assessment/Plan:  Get pt inas good a  shape as possible: then consider Palliative care if nothing else reasonable to be done. Await IM and GI evaluations, and IR Rx.

## 2016-04-12 NOTE — Evaluation (Signed)
Physical Therapy Evaluation Patient Details Name: [name] MRN: [id number] DOB: [birth year] Today's Date: 04/12/2016   History of Present Illness  Pt is a 76 y.o. male with a history of CAD, EtOH abuse, chronic atrial fibrillation, diverticulosis, PUD, and bladder cancer diagnosed in 2017, status post cystoscopy and thulium laser TURP on 03/12/2016, admitted 2/12-2/15, for UTI, hematuria, dehydration, weakness, acute blood loss anemia secondary to hematuria, status post needing one unit of packed red blood cells on 2/13, readmitted 2/18 , because of abdominal pain, failure to thrive, hydroureteronephrosis with worsening renal function with plan for R PCN on Monday 3/5  Clinical Impression  Pt admitted with above diagnosis. Pt currently with functional limitations due to the deficits listed below (see PT Problem List).  Pt will benefit from skilled PT to increase their independence and safety with mobility to allow discharge to the venue listed below.   Pt presents with limited endurance and generalized weakness.  Spouse reports pt has only been ambulating from bathroom to his bed at home for the past 2 weeks.  Pt awaiting right perc nephrostomy with antegrade nephrostogram for evaluation (likely Monday) and would benefit from continued mobility and PT.         Follow Up Recommendations Home health PT;Supervision/Assistance - 24 hour    Equipment Recommendations  None recommended by PT    Recommendations for Other Services       Precautions / Restrictions Precautions Precautions: Fall Precaution Comments: incontinence of bloody urine- a lot, wear pullups      Mobility  Bed Mobility Overal bed mobility: Needs Assistance Bed Mobility: Supine to Sit;Sit to Supine     Supine to sit: Supervision Sit to supine: Supervision   General bed mobility comments: increased time and effort  Transfers Overall transfer level: Needs assistance Equipment used: Rolling walker (2  wheeled) Transfers: Sit to/from Stand Sit to Stand: Min assist         General transfer comment: assist to rise and steady, pt on toilet upon entering room (spouse assisted him there), verbal cues for safety (some urine on floor as well) ambulated back to bed to change briefs before ambulating in hallway  Ambulation/Gait Ambulation/Gait assistance: Min guard   Assistive device: Rolling walker (2 wheeled) Gait Pattern/deviations: Step-through pattern;Decreased stride length     General Gait Details: verbal cues for RW positioning, posture, distance limited to tolerance, pt fatigues very quickly and reports generalized weakness.  Stairs            Wheelchair Mobility    Modified Rankin (Stroke Patients Only)       Balance           Standing balance support: Bilateral upper extremity supported;During functional activity Standing balance-Leahy Scale: Poor Standing balance comment: requires UE assist, spouse provided pericare in standing with pt holding RW, pt wished to return to supine to don new briefs                             Pertinent Vitals/Pain Pain Assessment: No/denies pain    Home Living Family/patient expects to be discharged to:: Private residence Living Arrangements: Spouse/significant other Available Help at Discharge: Family Type of Home: House Home Access: Stairs to enter     Home Layout: Two level Home Equipment: Environmental consultant - 2 wheels      Prior Function Level of Independence: Independent               Hand  Dominance        Extremity/Trunk Assessment        Lower Extremity Assessment Lower Extremity Assessment: Generalized weakness    Cervical / Trunk Assessment Cervical / Trunk Assessment: Kyphotic  Communication   Communication: No difficulties  Cognition Arousal/Alertness: Awake/alert Behavior During Therapy: Flat affect Overall Cognitive Status: Within Functional Limits for tasks assessed                       General Comments      Exercises     Assessment/Plan    PT Assessment Patient needs continued PT services  PT Problem List Decreased strength;Decreased activity tolerance;Decreased mobility;Decreased safety awareness;Decreased knowledge of use of DME;Decreased balance       PT Treatment Interventions DME instruction;Gait training;Functional mobility training;Therapeutic activities;Therapeutic exercise;Patient/family education;Balance training    PT Goals (Current goals can be found in the Care Plan section)  Acute Rehab PT Goals PT Goal Formulation: With patient/family Time For Goal Achievement: 04/26/16 Potential to Achieve Goals: Good    Frequency Min 3X/week   Barriers to discharge        Co-evaluation               End of Session Equipment Utilized During Treatment: Gait belt Activity Tolerance: Patient tolerated treatment well Patient left: in bed;with call bell/phone within reach;with family/visitor present Nurse Communication: Mobility status PT Visit Diagnosis: Muscle weakness (generalized) (M62.81);Other abnormalities of gait and mobility (R26.89)         Time: [id number] PT Time Calculation (min) (ACUTE ONLY): 14 min   Charges:   PT Evaluation $PT Eval Low Complexity: 1 Procedure     PT G Codes:         Melanny Wire,KATHrine E 04/12/2016, 3:55 PM Carmelia Bake, PT, DPT 04/12/2016 Pager: [phone]

## 2016-04-12 NOTE — Progress Notes (Signed)
[facility] Progress Note  Chief Complaint:   Cirrhosis and ascites  Subjective: No significant abdominal pain. No nausea just has no appetite. Admits to being worried about cancer.    Objective:  Vital signs in last 24 hours: Temp:  [97.9 F (36.6 C)-98.6 F (37 C)] 97.9 F (36.6 C) (03/02 0546) Pulse Rate:  [85-113] 113 (03/02 0546) Resp:  [16-18] 16 (03/02 0546) BP: (99-131)/(46-79) 119/77 (03/02 0546) SpO2:  [98 %] 98 % (03/02 0546) Last BM Date: 04/12/16 General:   Alert, thin  White male  in NAD EENT:  Normal hearing, non icteric sclera, conjunctive pink.  Heart:  Regular rate and rhythmn, no lower or extremity edema Pulm: Normal respiratory effort, lungs CTA bilaterally without wheezes or crackles. Abdomen:  Soft, mod distended, mild right mid abdominal tendernss.  Normal bowel sounds, no masses felt. No hepatomegaly.    Neurologic:  Alert and  oriented x4;  grossly normal neurologically. Psych:  Alert and cooperative. Normal mood and affect. Skin:   Intake/Output from previous day: 03/01 0701 - 03/02 0700 In: 1800 [I.V.:1800] Out: -  Intake/Output this shift: No intake/output data recorded.  Lab Results:  Recent Labs  04/10/16 1416  WBC 13.3*  HGB 12.4*  HCT 37.6*  PLT 322   BMET  Recent Labs  04/10/16 1416 04/12/16 0549  NA 133* 134*  K 4.5 4.0  CL 95* 103  CO2 26 22  GLUCOSE 116* 84  BUN 36* 33*  CREATININE 1.92* 1.65*  CALCIUM 9.2 8.3*   LFT  Recent Labs  04/11/16 1141 04/12/16 0549  PROT 5.9* 5.4*  ALBUMIN 2.5* 2.4*  AST 14* 15  ALT 11* 9*  ALKPHOS 71 64  BILITOT 1.3* 1.3*  BILIDIR 0.2  --   IBILI 1.1*  --    PT/INR  Recent Labs  04/10/16 1416 04/12/16 0549  LABPROT 15.6* 15.0  INR 1.23 1.17   Hepatitis Panel  Recent Labs  04/11/16 1141  HEPBSAG Negative  HCVAB <0.1  HEPAIGM Negative  HEPBIGM Negative     US Paracentesis  Result Date: 04/12/2016 INDICATION: Patient with new ascites.  Request is made for diagnostic paracentesis. EXAM: ULTRASOUND GUIDED DIAGNOSTIC PARACENTESIS MEDICATIONS: 10 mL 1% lidocaine COMPLICATIONS: None immediate. PROCEDURE: Informed written consent was obtained from the patient after a discussion of the risks, benefits and alternatives to treatment. A timeout was performed prior to the initiation of the procedure. Initial ultrasound scanning demonstrates a large amount of ascites within the right lateral abdomen. The right lateral abdomen was prepped and draped in the usual sterile fashion. 1% lidocaine was used for local anesthesia. Following this, a 19 gauge, 7-cm, Yueh catheter was introduced. An ultrasound image was saved for documentation purposes. The paracentesis was performed. The catheter was removed and a dressing was applied. The patient tolerated the procedure well without immediate post procedural complication. FINDINGS: A total of approximately 1.2 L of clear, yellow fluid was removed. Samples were sent to the laboratory as requested by the clinical team. IMPRESSION: Successful ultrasound-guided diagnostic paracentesis yielding 1.2 liters of peritoneal fluid. Read by:  Brynda Greathouse PA-C Electronically Signed   By: Lucrezia Europe M.D.   On: 04/11/2016 16:14   Assessment / Plan:  1. 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 For nephrostomy tub soon, awaiting clearance of blood thinner.   2. New ascites and cirrhotic appearing liver on imaging. Suspect ETOH treated.  Hep A,B,C negative. Ferritin elevated sat ratio  only 12. Additional liver studies pending. Ascitic fluid concerning for malignancy:  SAAG only 0.6, not c/w portal HTN and fluid LDH elevated 136. Fluid Cr 1.9 which isn't much higher than serum Cr (would expect it to be higher if urinary ascites). No SBP. Awaiting cytology   3. Anorexia. He isn't nauseated, mainly just no appetite. Admits to being worried. He has recently started Lexapro, it wasn't  continued in the hospital. He doesn't care to restart it. Perhaps we could give him to something to stimulate his appetite. I will talk with Hospitalist about it.    Principal Problem:   Acute on chronic renal failure (HCC) Active Problems:   Chronic atrial fibrillation (HCC)   Chronic obstructive pyelonephritis   Ascites   Cirrhosis of liver with ascites ([place])   Hydronephrosis   LOS: 2 days   Tye Savoy NP 04/12/2016, 9:22 AM  Pager number [phone]

## 2016-04-13 MED ORDER — SODIUM CHLORIDE 0.9% FLUSH
3.0000 mL | Freq: Two times a day (BID) | INTRAVENOUS | Status: DC
  Administered 2016-04-13 – 2016-04-15 (×4): 3 mL via INTRAVENOUS

## 2016-04-13 MED ORDER — SODIUM CHLORIDE 0.9% FLUSH: 3.0000 mL | INTRAVENOUS | Status: DC | PRN

## 2016-04-13 MED ORDER — SODIUM CHLORIDE 0.9 % IV SOLN
250.0000 mL | INTRAVENOUS | Status: DC | PRN
Start: 2016-04-13 — End: 2016-04-16

## 2016-04-13 NOTE — Progress Notes (Signed)
Subjective:   1 - Prostatic Hypertrophy - s/p thulium prostate vaporization 03/2106 by Gaynelle Arabian for refracotry outlet obstruction.  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 Neph tube pending for 04/15/16.   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 He has extensive CV and metabolic disease, not cystectomy candidate.  4 - Ascites - new large ascites by imaging 03/2016. Paracentesis 3/1 with cytology pending. Known alcoholic cirrhosis.  Today "[name]" is stable. Awaiting IR Rt neph tube Monday. No interval fevers.   Objective: Vital signs in last 24 hours: Temp:  [97.4 F (36.3 C)-98.9 F (37.2 C)] 97.4 F (36.3 C) (03/03 0541) Pulse Rate:  [72-92] 92 (03/03 0541) Resp:  [18] 18 (03/03 0541) BP: (112-132)/(77-95) 132/85 (03/03 0541) SpO2:  [98 %] 98 % (03/03 0541) Last BM Date: 04/13/16  Intake/Output from previous day: 03/02 0701 - 03/03 0700 In: 1248.3 [P.O.:30; I.V.:1218.3] Out: -  Intake/Output this shift: No intake/output data recorded.  General appearance: alert and ill-appearing, stigmata of weight loss / failure to thrive. Very pleasant.  Eyes: negative Nose: Nares normal. Septum midline. Mucosa normal. No drainage or sinus tenderness. Throat: lips, mucosa, and tongue normal; teeth and gums normal Neck: supple, symmetrical, trachea midline Back: symmetric, no curvature. ROM normal. No CVA tenderness. Resp: non-labored on room air.  Cardio: Nl rate GI: soft, non-tender; bowel sounds normal; no masses,  no organomegaly and mild distension. Old midline scar w/o hernia. Minimal fluid wave.  Male genitalia: normal, some continued incontincnec in adult diaper.  Extremities: extremities normal, atraumatic, no cyanosis or edema Lymph nodes: Cervical, supraclavicular, and axillary  nodes normal. Neurologic: Grossly normal  Lab Results:   Recent Labs  04/10/16 1416  WBC 13.3*  HGB 12.4*  HCT 37.6*  PLT 322   BMET  Recent Labs  04/10/16 1416 04/12/16 0549  NA 133* 134*  K 4.5 4.0  CL 95* 103  CO2 26 22  GLUCOSE 116* 84  BUN 36* 33*  CREATININE 1.92* 1.65*  CALCIUM 9.2 8.3*   PT/INR  Recent Labs  04/10/16 1416 04/12/16 0549  LABPROT 15.6* 15.0  INR 1.23 1.17   ABG No results for input(s): PHART, HCO3 in the last 72 hours.  Invalid input(s): PCO2, PO2  Studies/Results: US Paracentesis  Result Date: 04/12/2016 INDICATION: Patient with new ascites. Request is made for diagnostic paracentesis. EXAM: ULTRASOUND GUIDED DIAGNOSTIC PARACENTESIS MEDICATIONS: 10 mL 1% lidocaine COMPLICATIONS: None immediate. PROCEDURE: Informed written consent was obtained from the patient after a discussion of the risks, benefits and alternatives to treatment. A timeout was performed prior to the initiation of the procedure. Initial ultrasound scanning demonstrates a large amount of ascites within the right lateral abdomen. The right lateral abdomen was prepped and draped in the usual sterile fashion. 1% lidocaine was used for local anesthesia. Following this, a 19 gauge, 7-cm, Yueh catheter was introduced. An ultrasound image was saved for documentation purposes. The paracentesis was performed. The catheter was removed and a dressing was applied. The patient tolerated the procedure well without immediate post procedural complication. FINDINGS: A total of approximately 1.2 L of clear, yellow fluid was removed. Samples were sent to the laboratory as requested by the clinical team. IMPRESSION: Successful ultrasound-guided diagnostic paracentesis yielding 1.2 liters of peritoneal fluid. Read by:  Brynda Greathouse PA-C Electronically Signed  By: Lucrezia Europe M.D.   On: 04/11/2016 16:14    Anti-infectives: Anti-infectives    Start     Dose/Rate Route Frequency Ordered Stop    04/15/16 1200  ceFAZolin (ANCEF) IVPB 2g/100 mL premix     2 g 200 mL/hr over 30 Minutes Intravenous To Radiology 04/12/16 1035 04/16/16 1200   04/11/16 1200  amoxicillin (AMOXIL) capsule 500 mg     500 mg Oral Every 12 hours 04/11/16 1147     04/10/16 1400  doxycycline (VIBRA-TABS) tablet 100 mg  Status:  Discontinued     100 mg Oral Every 12 hours 04/10/16 1339 04/11/16 1147      Assessment/Plan:  1 - Prostatic Hypertrophy - no further treatment in house. Continue pull ups to keep him dry. Saline lock to help with symptoms.   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 Agree with neph tube Monday.   3 - Multifocal Urothelial Cancer - does not appear candidate for any curative intent treatment.   4 - Ascites - breathing well at present, awaiting cytology, if malignant, would portent VERY poor prognosis.    [facility], [name] 04/13/2016

## 2016-04-13 NOTE — Progress Notes (Signed)
PT Cancellation Note  Patient Details Name: YIYANG CARLEN MRN: [id number] DOB: [birth year]   Cancelled Treatment:    Reason Eval/Treat Not Completed: Patient declined,(declined to ambulate. family present and encouraged but patient declined.)Check back another time.   Claretha Cooper 04/13/2016, 4:24 PM Tresa Endo PT [phone]

## 2016-04-14 LAB — BASIC METABOLIC PANEL
ANION GAP: 9 (ref 5–15)
BUN: 23 mg/dL — ABNORMAL HIGH (ref 6–20)
CALCIUM: 8.3 mg/dL — AB (ref 8.9–10.3)
CO2: 21 mmol/L — AB (ref 22–32)
Chloride: 104 mmol/L (ref 101–111)
Creatinine, Ser: 1.52 mg/dL — ABNORMAL HIGH (ref 0.61–1.24)
GFR calc non Af Amer: 43 mL/min — ABNORMAL LOW (ref >60)
GFR, EST AFRICAN AMERICAN: 50 mL/min — AB (ref >60)
GLUCOSE: 106 mg/dL — AB (ref 65–99)
POTASSIUM: 4 mmol/L (ref 3.5–5.1)
Sodium: 134 mmol/L — ABNORMAL LOW (ref 135–145)

## 2016-04-14 MED ORDER — ESCITALOPRAM OXALATE 10 MG PO TABS
10.0000 mg | ORAL_TABLET | Freq: Every day | ORAL | Status: DC
  Administered 2016-04-15 – 2016-04-17 (×3): 10 mg via ORAL
  Filled 2016-04-14 (×3): qty 1

## 2016-04-14 MED ORDER — ONDANSETRON HCL 4 MG/2ML IJ SOLN
4.0000 mg | Freq: Three times a day (TID) | INTRAMUSCULAR | Status: DC
  Administered 2016-04-15 – 2016-04-17 (×8): 4 mg via INTRAVENOUS
  Filled 2016-04-14 (×8): qty 2

## 2016-04-14 NOTE — Progress Notes (Signed)
Subjective:  1 - Prostatic Hypertrophy - s/p thulium prostate vaporization 03/2106 by Gaynelle Arabian for refracotry outlet obstruction.  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 Neph tube pending for 04/15/16.   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 He has extensive CV and metabolic disease, not cystectomy candidate.  4 - Ascites - new large ascites by imaging 03/2016. Paracentesis 3/1 with cytology pending. Known alcoholic cirrhosis.  Today "[name]" is without complaints. Awaiting IR Rt neph tube Monday. Some mild tachycardia overnight, he denies s/s ETOH withdrawel only was drinking 1-2 per day prior to admit.   Objective: Vital signs in last 24 hours: Temp:  [97.4 F (36.3 C)-97.7 F (36.5 C)] 97.7 F (36.5 C) (03/04 0446) Pulse Rate:  [92-102] 102 (03/04 0446) Resp:  [18-19] 19 (03/04 0446) BP: (132-153)/(85-90) 153/90 (03/04 0446) SpO2:  [96 %-98 %] 96 % (03/04 0446) Last BM Date: 04/13/16  Intake/Output from previous day: 03/03 0701 - 03/04 0700 In: 240 [P.O.:240] Out: -  Intake/Output this shift: No intake/output data recorded.  General appearance: alert, cooperative, appears stated age and at recent baseline Eyes: negative Nose: Nares normal. Septum midline. Mucosa normal. No drainage or sinus tenderness. Throat: lips, mucosa, and tongue normal; teeth and gums normal Neck: supple, symmetrical, trachea midline Back: symmetric, no curvature. ROM normal. No CVA tenderness. Resp: non-labored on room air Cardio: regular rate and rhythm, S1, S2 normal, no murmur, click, rub or gallop GI: soft, non-tender; bowel sounds normal; no masses,  no organomegaly Male genitalia: normal, in pads for some urge leakage.  Extremities: extremities normal, atraumatic, no cyanosis or edema Skin:  Skin color, texture, turgor normal. No rashes or lesions Lymph nodes: Cervical, supraclavicular, and axillary nodes normal. Neurologic: Grossly normal  Lab Results:  No results for input(s): WBC, HGB, HCT, PLT in the last 72 hours. BMET  Recent Labs  04/12/16 0549  NA 134*  K 4.0  CL 103  CO2 22  GLUCOSE 84  BUN 33*  CREATININE 1.65*  CALCIUM 8.3*   PT/INR  Recent Labs  04/12/16 0549  LABPROT 15.0  INR 1.17   ABG No results for input(s): PHART, HCO3 in the last 72 hours.  Invalid input(s): PCO2, PO2  Studies/Results: No results found.  Anti-infectives: Anti-infectives    Start     Dose/Rate Route Frequency Ordered Stop   04/15/16 1200  ceFAZolin (ANCEF) IVPB 2g/100 mL premix     2 g 200 mL/hr over 30 Minutes Intravenous To Radiology 04/12/16 1035 04/16/16 1200   04/11/16 1200  amoxicillin (AMOXIL) capsule 500 mg     500 mg Oral Every 12 hours 04/11/16 1147     04/10/16 1400  doxycycline (VIBRA-TABS) tablet 100 mg  Status:  Discontinued     100 mg Oral Every 12 hours 04/10/16 1339 04/11/16 1147      Assessment/Plan:  1 - Prostatic Hypertrophy - no further treatment in house. Continue pull ups to keep him dry. Saline lock to help with symptoms.   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 Agree with neph tube Monday.   NPO p MN for neph tube tomorrow.   3 - Multifocal Urothelial Cancer - does not appear candidate for  any curative intent treatment.   4 - Ascites - breathing well at present, awaiting cytology, if malignant, would portent VERY poor prognosis. Offered alcohol TID meals to help avoid withdrawal .   Anikah Hogge 04/14/2016

## 2016-04-15 ENCOUNTER — Inpatient Hospital Stay (HOSPITAL_COMMUNITY): Payer: PPO

## 2016-04-15 ENCOUNTER — Encounter (HOSPITAL_COMMUNITY): Payer: Self-pay | Admitting: Interventional Radiology

## 2016-04-15 HISTORY — PX: IR GENERIC HISTORICAL: IMG1180011

## 2016-04-15 LAB — CBC
HCT: 37.7 % — ABNORMAL LOW (ref 39.0–52.0)
Hemoglobin: 12.6 g/dL — ABNORMAL LOW (ref 13.0–17.0)
MCH: 33.3 pg (ref 26.0–34.0)
MCHC: 33.4 g/dL (ref 30.0–36.0)
MCV: 99.7 fL (ref 78.0–100.0)
Platelets: 214 10*3/uL (ref 150–400)
RBC: 3.78 MIL/uL — AB (ref 4.22–5.81)
RDW: 16.3 % — AB (ref 11.5–15.5)
WBC: 18.2 10*3/uL — AB (ref 4.0–10.5)

## 2016-04-15 LAB — BASIC METABOLIC PANEL
ANION GAP: 12 (ref 5–15)
BUN: 27 mg/dL — ABNORMAL HIGH (ref 6–20)
CO2: 22 mmol/L (ref 22–32)
Calcium: 8.8 mg/dL — ABNORMAL LOW (ref 8.9–10.3)
Chloride: 105 mmol/L (ref 101–111)
Creatinine, Ser: 1.72 mg/dL — ABNORMAL HIGH (ref 0.61–1.24)
GFR, EST AFRICAN AMERICAN: 43 mL/min — AB (ref >60)
GFR, EST NON AFRICAN AMERICAN: 37 mL/min — AB (ref >60)
GLUCOSE: 126 mg/dL — AB (ref 65–99)
POTASSIUM: 4.5 mmol/L (ref 3.5–5.1)
SODIUM: 139 mmol/L (ref 135–145)

## 2016-04-15 MED ORDER — FENTANYL CITRATE (PF) 100 MCG/2ML IJ SOLN
INTRAMUSCULAR | Status: AC
  Filled 2016-04-15: qty 4

## 2016-04-15 MED ORDER — IOPAMIDOL (ISOVUE-300) INJECTION 61%
INTRAVENOUS | Status: AC
  Administered 2016-04-15: 11:00:00
  Filled 2016-04-15: qty 50

## 2016-04-15 MED ORDER — DEXTROSE-NACL 5-0.45 % IV SOLN
INTRAVENOUS | Status: DC
Start: 2016-04-15 — End: 2016-04-16
  Administered 2016-04-15: 19:00:00 via INTRAVENOUS

## 2016-04-15 MED ORDER — LIDOCAINE HCL 1 % IJ SOLN
INTRAMUSCULAR | Status: AC | PRN
  Administered 2016-04-15: 15 mL

## 2016-04-15 MED ORDER — HEPARIN SODIUM (PORCINE) 5000 UNIT/ML IJ SOLN
5000.0000 [IU] | Freq: Three times a day (TID) | INTRAMUSCULAR | Status: DC
  Administered 2016-04-15 – 2016-04-16 (×3): 5000 [IU] via SUBCUTANEOUS
  Filled 2016-04-15 (×3): qty 1

## 2016-04-15 MED ORDER — IOPAMIDOL (ISOVUE-300) INJECTION 61%: 30.0000 mL | Freq: Once | INTRAVENOUS | Status: DC | PRN

## 2016-04-15 MED ORDER — MIDAZOLAM HCL 2 MG/2ML IJ SOLN
INTRAMUSCULAR | Status: AC | PRN
  Administered 2016-04-15 (×2): 1 mg via INTRAVENOUS

## 2016-04-15 MED ORDER — LIDOCAINE HCL 1 % IJ SOLN
INTRAMUSCULAR | Status: AC
  Filled 2016-04-15: qty 20

## 2016-04-15 MED ORDER — MIDAZOLAM HCL 2 MG/2ML IJ SOLN
INTRAMUSCULAR | Status: AC
  Filled 2016-04-15: qty 4

## 2016-04-15 MED ORDER — FLUMAZENIL 0.5 MG/5ML IV SOLN
INTRAVENOUS | Status: AC
  Filled 2016-04-15: qty 5

## 2016-04-15 MED ORDER — FENTANYL CITRATE (PF) 100 MCG/2ML IJ SOLN
INTRAMUSCULAR | Status: AC | PRN
  Administered 2016-04-15 (×2): 50 ug via INTRAVENOUS

## 2016-04-15 MED ORDER — NALOXONE HCL 0.4 MG/ML IJ SOLN
INTRAMUSCULAR | Status: AC
  Filled 2016-04-15: qty 1

## 2016-04-15 MED ORDER — CEFAZOLIN SODIUM-DEXTROSE 2-4 GM/100ML-% IV SOLN
INTRAVENOUS | Status: AC
  Filled 2016-04-15: qty 100

## 2016-04-15 NOTE — Progress Notes (Signed)
Urology Progress Note  1. Right Hydronephrosis: Right perc nephrostomy, urine cytology, antegrade nephrostogram:                                               O: double lesions in ureter. ? Tumor/strictures. Will need decompression x 1 week and then repeat                                                     Antegrade nephrostogram next week. Will also need repeat Cr/gfr to see if his CKD IIIb  39cc/min                                                     Improves.                                                 A: Right perc tube to straight drain.                                                 P: await cytology, decompression, repeat Cr/gfr/antegrade nephrostogram I n 1 week.                                             2. Cirrhosis, ascites.                                                 O: see note GI. Possible malignant ascites. ? Origin. ? Rx.                                            3.  Discharge planning: family wants Palliative care consult; inpatinet rehab consult ( doubt he qualifies); and                                               Will get [facility] evaluiation tomorrow.                                               4. Dehydration: pt has IV still in: if he does not drink, he will need IV fluids.   Subjective:     No  acute urologic events overnight. Ambulation:   negative Flatus:    positive Bowel movement  negative  Pain: some relief  Objective:  Blood pressure (!) 144/100, pulse 96, temperature 97.7 F (36.5 C), temperature source Oral, resp. rate 16, height 5\' 8"  (1.727 m), weight 77.6 kg (171 lb), SpO2 96 %.  Physical Exam:  General:  No acute distress, awake Extremities: extremities normal, atraumatic, no cyanosis or edema Genitourinary:  neg Foley:none. Right perc tube in place     No intake/output data recorded.  Recent Labs     04/15/16  0525  HGB  12.6*  WBC  18.2*  PLT  214    Recent Labs     04/14/16  1046  04/15/16  0525  NA  134*  139   K  4.0  4.5  CL  104  105  CO2  21*  22  BUN  23*  27*  CREATININE  1.52*  1.72*  CALCIUM  8.3*  8.8*  GFRNONAA  43*  37*  GFRAA  50*  43*     No results for input(s): INR, APTT in the last 72 hours.  Invalid input(s): PT   Invalid input(s): ABG  Assessment/Plan:  IV fluids tonight. Marland Kitchen

## 2016-04-15 NOTE — Progress Notes (Signed)
Patient ID: [name], male   DOB: 18-Oct-1940, 76 y.o.   MRN: [id number]   Brief GI note:  Pt had R percutaneous nephrostomy done this afternoon  Prelim on cytology from ascitic fluid is malignant- final to be out tomorrow .

## 2016-04-15 NOTE — Sedation Documentation (Signed)
A fib

## 2016-04-15 NOTE — Progress Notes (Signed)
PT Cancellation Note  Patient Details Name: [name] MRN: [id number] DOB: August 11, 1940   Cancelled Treatment:    Reason Eval/Treat Not Completed: Patient at procedure or test/unavailable   Aylah Yeary,KATHrine E 04/15/2016, 10:48 AM Carmelia Bake, PT, DPT 04/15/2016 Pager: [phone]

## 2016-04-15 NOTE — Progress Notes (Signed)
Patient had an episode of vomiting green bile after taking his medications tonight. Pt denies that this has happened before and states that he hasn't been eating very much.  Encouraged patient to take nausea medicine to help with symptoms but patient continually declines.  Will continue to monitor patient.

## 2016-04-15 NOTE — Progress Notes (Signed)
MEDICATION-RELATED CONSULT NOTE   IR Procedure Consult - Anticoagulant/Antiplatelet PTA/Inpatient Med List Review by Pharmacist    Procedure: R- sided percutaneous nephrostomy tube placement    Completed: 04/15/16 at 12:09  Post-Procedural bleeding risk per IR MD assessment:  standard  Antithrombotic medications on inpatient or PTA profile prior to procedure:       Recommended restart time per IR Post-Procedure Guidelines:     Other considerations:      Plan:      Per guideline, hold heparin for at least 6h, will resume at 22:00 tonight  Await orders to resume home pradaxa when appropriate  Doreene Eland, PharmD, BCPS.   Pager: [phone] 04/15/2016 12:47 PM

## 2016-04-15 NOTE — Sedation Documentation (Signed)
Epic malfunctioning. Cardiac Rhythm - Afib

## 2016-04-15 NOTE — Procedures (Signed)
RIGHT perc nephrostomy tube placed Urine aspirate sent for cytology Antegrade nephrostogram shows tandem mid and distal ureteral obstructing lesions  No complication No blood loss. See complete dictation in [facility].

## 2016-04-16 DIAGNOSIS — R109 Unspecified abdominal pain: Secondary | ICD-10-CM

## 2016-04-16 DIAGNOSIS — R600 Localized edema: Secondary | ICD-10-CM

## 2016-04-16 DIAGNOSIS — R180 Malignant ascites: Secondary | ICD-10-CM

## 2016-04-16 DIAGNOSIS — R627 Adult failure to thrive: Secondary | ICD-10-CM

## 2016-04-16 LAB — BASIC METABOLIC PANEL
Anion gap: 8 (ref 5–15)
BUN: 25 mg/dL — AB (ref 6–20)
CHLORIDE: 105 mmol/L (ref 101–111)
CO2: 22 mmol/L (ref 22–32)
Calcium: 8.3 mg/dL — ABNORMAL LOW (ref 8.9–10.3)
Creatinine, Ser: 1.67 mg/dL — ABNORMAL HIGH (ref 0.61–1.24)
GFR calc Af Amer: 44 mL/min — ABNORMAL LOW (ref >60)
GFR, EST NON AFRICAN AMERICAN: 38 mL/min — AB (ref >60)
GLUCOSE: 128 mg/dL — AB (ref 65–99)
POTASSIUM: 4 mmol/L (ref 3.5–5.1)
Sodium: 135 mmol/L (ref 135–145)

## 2016-04-16 LAB — URINALYSIS, ROUTINE W REFLEX MICROSCOPIC
BILIRUBIN URINE: NEGATIVE
GLUCOSE, UA: NEGATIVE mg/dL
KETONES UR: NEGATIVE mg/dL
NITRITE: NEGATIVE
PH: 5 (ref 5.0–8.0)
PROTEIN: 100 mg/dL — AB
Specific Gravity, Urine: 1.026 (ref 1.005–1.030)
Squamous Epithelial / LPF: NONE SEEN

## 2016-04-16 LAB — CBC
HCT: 34.6 % — ABNORMAL LOW (ref 39.0–52.0)
Hemoglobin: 11.7 g/dL — ABNORMAL LOW (ref 13.0–17.0)
MCH: 33.2 pg (ref 26.0–34.0)
MCHC: 33.8 g/dL (ref 30.0–36.0)
MCV: 98.3 fL (ref 78.0–100.0)
PLATELETS: 200 10*3/uL (ref 150–400)
RBC: 3.52 MIL/uL — ABNORMAL LOW (ref 4.22–5.81)
RDW: 16.4 % — AB (ref 11.5–15.5)
WBC: 12.5 10*3/uL — AB (ref 4.0–10.5)

## 2016-04-16 LAB — CULTURE, BODY FLUID-BOTTLE

## 2016-04-16 LAB — DIFFERENTIAL
BASOS PCT: 0 %
Basophils Absolute: 0 10*3/uL (ref 0.0–0.1)
EOS PCT: 0 %
Eosinophils Absolute: 0.1 10*3/uL (ref 0.0–0.7)
Lymphocytes Relative: 7 %
Lymphs Abs: 0.9 10*3/uL (ref 0.7–4.0)
MONO ABS: 0.8 10*3/uL (ref 0.1–1.0)
Monocytes Relative: 7 %
Neutro Abs: 10.7 10*3/uL — ABNORMAL HIGH (ref 1.7–7.7)
Neutrophils Relative %: 86 %

## 2016-04-16 LAB — CREATININE, URINE, RANDOM: Creatinine, Urine: 199.88 mg/dL

## 2016-04-16 LAB — CULTURE, BODY FLUID W GRAM STAIN -BOTTLE: Culture: NO GROWTH

## 2016-04-16 LAB — SODIUM, URINE, RANDOM: Sodium, Ur: 45 mmol/L

## 2016-04-16 MED ORDER — ENSURE ENLIVE PO LIQD: 237.0000 mL | Freq: Two times a day (BID) | ORAL | Status: DC

## 2016-04-16 MED ORDER — SODIUM CHLORIDE 0.9 % IV SOLN
INTRAVENOUS | Status: AC
  Administered 2016-04-16: 12:00:00 via INTRAVENOUS

## 2016-04-16 MED ORDER — DABIGATRAN ETEXILATE MESYLATE 150 MG PO CAPS
150.0000 mg | ORAL_CAPSULE | Freq: Two times a day (BID) | ORAL | Status: DC
  Administered 2016-04-16 – 2016-04-17 (×2): 150 mg via ORAL
  Filled 2016-04-16 (×2): qty 1

## 2016-04-16 NOTE — Progress Notes (Addendum)
[facility] Consult PROGRESS NOTE    Progress Note  [name]  [id number] DOB: Jul 01, 1940 DOA: 04/10/2016 PCP: Tivis Ringer, MD     Brief Narrative:   [name] is an 76 y.o. 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 Angelena Form. bladder cancer diagnosed in 2017, status post  cystoscopy and thulium laser TURP on 03/12/2016, admitted 2/12-2/15, for UTI, hematuria, dehydration, weakness, acute blood loss anemia secondary to hematuria, status post needing one unit of packed red blood cells on 2/13, readmitted 2/18 , because of abdominal pain, failure to thrive, hydroureteronephrosis with worsening renal function. Medicine was requested for  medical management  Assessment/Plan:   Generalized abdominal pain: CT scan of the abdomen and pelvis showed ascites cirrhosis and right hydronephrosis. Status post percutaneous nephrostomy tube placement on 04/15/2016. He relates his pain is controlled. CT scan of the abdomen did not show any acute findings.  Acute on chronic renal failure [facility]): Urology consulted for right nephrostomy tube placement on 04/15/2016.  Continue strict I's and O's Creatinine seems to be in the range in which his crit has been before 1.5-1.9 Check urine lites, check a UA for protein. This is likely his new baseline. He has not received any contrast recently, no nephrotoxic medications, He is mildly dehydrated. ANA was positive which could probably be due to his malignancy, is a 76 year old male with prostate problems he is unlikely to develop lupus at this age. There are other glomerulonephritis which can elevate your ANA but likely. We'll continue to trend creatinine. Check a basic metabolic panel in the morning. 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  Possible UTI: Continue amoxicillin per  Urology.  Chronic atrial fibrillation (HCC) Continue hold anticoagulation GI and urology to recommend when to start. After being rate control for some time and now he is mildly tachycardic probable dehydration.  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 Further menegement per GI. SAAG < 1, awaiting cytology results concern about malignancy. I highly recommend getting palliative care involved.  Coronary artery Disease: Continue beta blocker as he is asymptomatic.  Peptic ulcer disease: Continue PPI.  Anorexia/depression: Remeron 04/12/2016, consulted about 6 weeks to work. Continue ensure 3 times a day.  Leukocytosis with no fevers: Bilateral differential CBC, this could probably be due to his metastatic disease. He has remained afebrile urine cultures and blood cultures remain negative till date, fluid cytology showed less than 500 PMN sites unlikely has BP.   DVT prophylaxis: Heparin Family Communication:none Disposition Plan/Barrier to D/C: per urology Code Status:  Code Status History    Date Active Date Inactive Code Status Order ID Comments User Context   04/10/2016  1:39 PM 04/10/2016  2:01 PM Full Code [id number]  [name], MD Inpatient   03/25/2016  7:46 PM 03/28/2016  4:21 PM Full Code [id number]  Janece Canterbury, MD Inpatient   09/30/2012  1:04 PM 10/04/2012 12:19 PM Full Code [id number]  Excell Seltzer, MD Inpatient   05/23/2012  9:39 PM 05/24/2012  2:26 PM Full Code [id number]  Kristeen Miss, MD Inpatient   04/07/2012  4:31 PM 04/11/2012  3:00 PM Full Code [id number]  Edward Jolly, MD Inpatient   04/05/2011  5:27 PM 04/08/2011  1:43 PM Full Code [id number]  Nilda Calamity, RN Inpatient    Advance Directive Documentation  Flowsheet Row Most Recent Value  Type of Advance Directive  Healthcare Power of Attorney, Living will  Pre-existing out of facility DNR order (yellow form or pink MOST form)  No data  "MOST"  Form in Place?  No data        IV Access:    Peripheral IV   Procedures and diagnostic studies:   Ir Nephrostomy Placement Right  Result Date: 04/15/2016 CLINICAL DATA:  History of urothelial carcinoma. Renal insufficiency, with right hydronephrosis. EXAM: 1. RIGHT PERCUTANEOUS NEPHROSTOMY CATHETER PLACEMENT UNDER ULTRASOUND AND FLUOROSCOPIC GUIDANCE 2. ANTEGRADE NEPHROSTOGRAM FLUOROSCOPY TIME:  4 minutes 46 seconds, 97 mGy TECHNIQUE: The procedure, risks (including but not limited to bleeding, infection, organ damage ), benefits, and alternatives were explained to the patient. Questions regarding the procedure were encouraged and answered. The patient understands and consents to the procedure. RightFlank region prepped with Betadine, draped in usual sterile fashion, infiltrated locally with 1% lidocaine As antibiotic prophylaxis, cefazolin 2 grams was ordered pre-procedure and administered intravenously within one hour of incision. Intravenous Fentanyl and Versed were administered as conscious sedation during continuous monitoring of the patient's level of consciousness and physiological / cardiorespiratory status by the radiology RN, with a total moderate sedation time of 35 minutes. Under real-time ultrasound guidance, a 21-gauge trocar needle was advanced into a posterior lower pole calyx. Ultrasound image documentation was saved. Urine spontaneously returned through the needle. A 018 guidewire advanced, its position confirmed on fluoroscopy. Needle exchanged for transitional dilator. Contrast injection showed retroperitoneal spread of contrast with incomplete opacification of the renal collecting system. That reason, a different approach to the collecting system was taken. Again in similar fashion, under real-time ultrasound guidance a 21 gauge trocar needle with was advanced into a posterior lower pole calyx. Urine spontaneously returned through the hub. A 018 guidewire advanced easily. Needle  was exchanged over a guidewire for transitional dilator. Contrast injection confirmed appropriate positioning. A Kumpe catheter was advanced . A lesion in the mid right ureter could not be traversed with catheter and guidewire combinations. Antegrade nephrostogram performed. Catheter was exchanged over a guidewire for a 10 French pigtail catheter, formed centrally within the right renal collecting system. Contrast injection confirms appropriate positioning and patency. Catheter secured externally with 0 Prolene suture and placed to external drain bag. COMPLICATIONS: COMPLICATIONS none IMPRESSION: 1. Technically successful right percutaneous nephrostomy catheter placement. 2. Antegrade nephrostogram demonstrates tandem lesions. There is a focal near-occlusive stenosis in the mid right ureter. Inferior to an intervening dilated segment of ureter, There is a 2nd obstructive lesion in the distal ureter such that no contrast was seen entering the urinary bladder. Electronically Signed   By: Lucrezia Europe M.D.   On: 04/15/2016 15:16     Medical Consultants:    None.  Anti-Infectives:   Amocixillin and ancef  Subjective:    [name] he relates his pain is controlled. Patient continues to be a stoic minimal verbal communication. He seems to be indifference about use and outcomes  Objective:    Vitals:   04/15/16 1208 04/15/16 1227 04/15/16 2036 04/16/16 0519  BP:  (!) 144/100 123/69 (!) 132/93  Pulse:  96 79 100  Resp:  16 16 16   Temp:  97.7 F (36.5 C) 98 F (36.7 C) 97.6 F (36.4 C)  TempSrc:  Oral Oral Oral  SpO2: 96% 96% 95% 97%  Weight:      Height:        Intake/Output Summary (Last 24 hours) at 04/16/16 0945  Last data filed at 04/16/16 0531  Gross per 24 hour  Intake              425 ml  Output               30 ml  Net              395 ml   Filed Weights   04/10/16 1441  Weight: 77.6 kg (171 lb)    Exam: General exam: In no acute distress. Respiratory system:    Cardiovascular system: Gastrointestinal system:  Extremities: No pedal edema. Skin: No rashes, lesions or ulcers Psychiatry: Judgement and insight appear normal. Mood & affect appropriate.    Data Reviewed:    Labs: Basic Metabolic Panel:  Recent Labs Lab 04/10/16 1416 04/12/16 0549 04/14/16 1046 04/15/16 0525  NA 133* 134* 134* 139  K 4.5 4.0 4.0 4.5  CL 95* 103 104 105  CO2 26 22 21* 22  GLUCOSE 116* 84 106* 126*  BUN 36* 33* 23* 27*  CREATININE 1.92* 1.65* 1.52* 1.72*  CALCIUM 9.2 8.3* 8.3* 8.8*   GFR Estimated Creatinine Clearance: 35.3 mL/min (by C-G formula based on SCr of 1.72 mg/dL (H)). Liver Function Tests:  Recent Labs Lab 04/10/16 1416 04/11/16 1141 04/12/16 0549  AST 17 14* 15  ALT 11* 11* 9*  ALKPHOS 85 71 64  BILITOT 1.2 1.3* 1.3*  PROT 6.7 5.9* 5.4*  ALBUMIN 3.0* 2.5* 2.4*   No results for input(s): LIPASE, AMYLASE in the last 168 hours. No results for input(s): AMMONIA in the last 168 hours. Coagulation profile  Recent Labs Lab 04/10/16 1416 04/12/16 0549  INR 1.23 1.17    CBC:  Recent Labs Lab 04/10/16 1416 04/15/16 0525  WBC 13.3* 18.2*  NEUTROABS 11.7*  --   HGB 12.4* 12.6*  HCT 37.6* 37.7*  MCV 100.5* 99.7  PLT 322 214   Cardiac Enzymes: No results for input(s): CKTOTAL, CKMB, CKMBINDEX, TROPONINI in the last 168 hours. BNP (last 3 results) No results for input(s): PROBNP in the last 8760 hours. CBG: No results for input(s): GLUCAP in the last 168 hours. D-Dimer: No results for input(s): DDIMER in the last 72 hours. Hgb A1c: No results for input(s): HGBA1C in the last 72 hours. Lipid Profile: No results for input(s): CHOL, HDL, LDLCALC, TRIG, CHOLHDL, LDLDIRECT in the last 72 hours. Thyroid function studies: No results for input(s): TSH, T4TOTAL, T3FREE, THYROIDAB in the last 72 hours.  Invalid input(s): FREET3 Anemia work up: No results for input(s): VITAMINB12, FOLATE, FERRITIN, TIBC, IRON, RETICCTPCT in the  last 72 hours. Sepsis Labs:  Recent Labs Lab 04/10/16 1416 04/15/16 0525  WBC 13.3* 18.2*  LATICACIDVEN 1.8  --    Microbiology Recent Results (from the past 240 hour(s))  Culture, body fluid-bottle     Status: None (Preliminary result)   Collection Time: 04/11/16  2:50 PM  Result Value Ref Range Status   Specimen Description FLUID PERITONEAL  Final   Special Requests BOTTLES DRAWN AEROBIC AND ANAEROBIC 10CC  Final   Culture   Final    NO GROWTH 4 DAYS Performed at [facility], [place] [address], [place], [place] [address]    Report Status PENDING  Incomplete  Gram stain     Status: None   Collection Time: 04/11/16  2:50 PM  Result Value Ref Range Status   Specimen Description FLUID PERITONEAL  Final   Special Requests NONE  Final   Gram Stain  Final    CYTOSPIN SMEAR WBC PRESENT,BOTH PMN AND MONONUCLEAR NO ORGANISMS SEEN Performed at [facility], [place] [address] [place], [place] [address]    Report Status 04/11/2016 FINAL  Final     Medications:   . amoxicillin  500 mg Oral Q12H  . escitalopram  10 mg Oral Daily  . finasteride  5 mg Oral Daily  . folic acid  1 mg Oral Daily  . heparin subcutaneous  5,000 Units Subcutaneous Q8H  . metoprolol succinate  50 mg Oral Daily  . mirtazapine  7.5 mg Oral QHS  . multivitamin with minerals  1 tablet Oral Daily  . ondansetron (ZOFRAN) IV  4 mg Intravenous TID AC  . pantoprazole  40 mg Oral Daily  . polyethylene glycol  17 g Oral Daily  . sodium chloride flush  3 mL Intravenous Q12H  . thiamine  100 mg Oral Daily   Continuous Infusions: . dextrose 5 % and 0.45% NaCl 75 mL/hr at 04/15/16 1852    Time spent:35 min   LOS: 6 days   Charlynne Cousins  [facility] Pager [phone]  *Please refer to [email or web address], password [email or web address] to get updated schedule on who will round on this patient, as hospitalists switch teams weekly. If 7PM-7AM, please contact night-coverage at [email or web address], password [email or web address] for any  overnight needs.  04/16/2016, 9:45 AM

## 2016-04-16 NOTE — Consult Note (Signed)
Reason for Referral: Bladder tumor and ascites.   HPI: 76 year old gentleman with history of recurrent superficial bladder tumors under the care of Dr. Gaynelle Arabian and have undergone multiple treatments in the past. He has been treated with BCG in the past that have tolerated it poorly. He was hospitalized on 04/11/2016 with symptoms of failure to thrive, abdominal pain and distention. His workup included a CT scan of the abdomen and pelvis which showed a hydronephrosis as well as abdominal ascites. There are also findings suggesting cirrhosis of the liver. He underwent a abdominal paracentesis and cytology currently pending although initial report my suggest malignant cells. He also underwent a percutaneous nephrostomy tube for his hydronephrosis. Despite his decompression of his hydronephrosis, his kidney function remains poor.  In the meantime, patient continues to decline coating for by mouth intake and poor performance status. He does report abdominal distention and lower extremity edema. He did not report any hematuria or dysuria. He does not report any hematochezia or melena.  He does not report any headaches, blurry vision, syncope or seizures. He does not report any fevers or chills or sweats. He is not report any cough, wheezing or hemoptysis. He does not play nausea, vomiting but does report abdominal distention and early satiety. He does not report any skeletal complaints. Remaining review of systems unremarkable.   Past Medical History:  Diagnosis Date  . AAA (abdominal aortic aneurysm) ([place]) 02-25-2011   US abdomen 3.4 cm x 3 cm last US done per pt  . Anemia   . CAD (coronary artery disease)   . Cancer [facility])    Bladder cancer  . Chronic atrial fibrillation (HCC)    Dr. Angelena Form  . Duodenal ulcer   . Dysrhythmia    Atrial fibrillation-Dr. Angelena Form follows  . Gout   . Heart attack   . Heart murmur   . Hematuria    "bladder cancer"  . Hemorrhoids   . History of blood transfusion     last -5 yrs ago "ulcer'  . History of GI bleed   . Hyperlipidemia   . Hypertension   . Macular degeneration, dry    BILATERAL  . Myocardial infarction    history of. 1983  . Renal insufficiency   :  Past Surgical History:  Procedure Laterality Date  . CARDIAC CATHETERIZATION     '83  . CAROTID ENDARTERECTOMY     bilaterally 2007  . CATARACT EXTRACTION Bilateral 2-3 yrs ago  . Cath/angioplasy     [id number], [place] by Dr. Gilda Crease [facility] of angioplasty)  . COLOSTOMY N/A 04/07/2012   Procedure: COLOSTOMY;  Surgeon: Edward Jolly, MD;  Location: [facility];  Service: General;  Laterality: N/A;  Sigmoid Colectomy Colostomy   . COLOSTOMY TAKEDOWN N/A 09/30/2012   Procedure: TAKEDOWN OF HARTMAN COLOSTOMY;  Surgeon: Edward Jolly, MD;  Location: [facility];  Service: General;  Laterality: N/A;  . CYSTOSCOPY     done in Office -Dr. Gaynelle Arabian 05-15-15  . CYSTOSCOPY N/A 07/28/2015   Procedure: CYSTOSCOPY;  Surgeon: [name], MD;  Location: [facility];  Service: Urology;  Laterality: N/A;  . CYSTOSCOPY W/ URETERAL STENT PLACEMENT Right 05/26/2015   Procedure: CYSTOSCOPY WITH LEFT RETROGRADE PYELOGRAM/URETERAL ;  Surgeon: [name], MD;  Location: [facility];  Service: Urology;  Laterality: Right;  . ESOPHAGOGASTRODUODENOSCOPY  04/06/2011   Procedure: ESOPHAGOGASTRODUODENOSCOPY (EGD);  Surgeon: Beryle Beams, MD;  Location: [facility];  Service: Endoscopy;  Laterality: N/A;  . EYE SURGERY  2011   bilateral  .  IR GENERIC HISTORICAL  04/15/2016   IR NEPHROSTOMY PLACEMENT RIGHT 04/15/2016 [facility]  . LAMINECTOMY N/A 05/23/2012   Procedure: LUMBAR four-five LAMINECTOMY ;  Surgeon: Kristeen Miss, MD;  Location: [facility];  Service: Neurosurgery;  Laterality: N/A;  . No colonoscopy to date     ("I don't have a good excuse"). SOC reviewed  . PARTIAL COLECTOMY N/A 04/07/2012   Procedure: PARTIAL COLECTOMY;  Surgeon: Edward Jolly, MD;  [facility];  Service: General;   Laterality: N/A;  Sigmoid Colectomy Colostomy hartman procedure  . pyloric stenosis    . TONSILLECTOMY  as child  . tooth extraction  09/24/2013   and dental implant  . TRANSURETHRAL RESECTION OF BLADDER TUMOR N/A 05/26/2015   Procedure: TRANSURETHRAL RESECTION OF BLADDER TUMOR (TURBT);  Surgeon: [name], MD;  [facility];  Service: Urology;  Laterality: N/A;  . TRANSURETHRAL RESECTION OF BLADDER TUMOR N/A 07/28/2015   Procedure: TRANSURETHRAL RESECTION OF BLADDER  BIOPSY;  Surgeon: [name], MD;  [facility];  Service: Urology;  Laterality: N/A;  . TRANSURETHRAL RESECTION OF BLADDER TUMOR N/A 03/12/2016   Procedure: POSSIBLE TRANSURETHRAL RESECTION OF BLADDER TUMOR (TURBT);  Surgeon: [name], MD;  Location[facility];  Service: Urology;  Laterality: N/A;  :   Current Facility-Administered Medications:  .  0.9 %  sodium chloride infusion, , Intravenous, Continuous, Charlynne Cousins, MD, Last Rate: 75 mL/hr at 04/16/16 1152 .  acetaminophen (TYLENOL) tablet 650 mg, 650 mg, Oral, Q4H PRN, [name], MD .  amoxicillin (AMOXIL) capsule 500 mg, 500 mg, Oral, Q12H, Reyne Dumas, MD, 500 mg at 04/16/16 0900 .  bisacodyl (DULCOLAX) EC tablet 5 mg, 5 mg, Oral, Daily PRN, [name], MD .  bisacodyl (DULCOLAX) EC tablet 5 mg, 5 mg, Oral, Daily PRN, [name], MD .  dabigatran (PRADAXA) capsule 150 mg, 150 mg, Oral, Q12H, [name], MD .  diphenhydrAMINE (BENADRYL) injection 12.5 mg, 12.5 mg, Intravenous, Q6H PRN **OR** diphenhydrAMINE (BENADRYL) 12.5 MG/5ML elixir 12.5 mg, 12.5 mg, Oral, Q6H PRN, [name], MD .  escitalopram (LEXAPRO) tablet 10 mg, 10 mg, Oral, Daily, Charlynne Cousins, MD, 10 mg at 04/16/16 0900 .  feeding supplement (ENSURE ENLIVE) (ENSURE ENLIVE) liquid 237 mL, 237 mL, Oral, BID BM, Charlynne Cousins, MD .  finasteride (PROSCAR) tablet 5 mg, 5 mg, Oral, Daily, Reyne Dumas, MD, 5 mg at 04/16/16  0900 .  folic acid (FOLVITE) tablet 1 mg, 1 mg, Oral, Daily, Reyne Dumas, MD, 1 mg at 04/16/16 0901 .  HYDROcodone-acetaminophen (NORCO/VICODIN) 5-325 MG per tablet 1-2 tablet, 1-2 tablet, Oral, Q4H PRN, [name], MD .  iopamidol (ISOVUE-300) 61 % injection 30 mL, 30 mL, Intravenous, Once PRN, Arne Cleveland, MD .  metoprolol succinate (TOPROL-XL) 24 hr tablet 50 mg, 50 mg, Oral, Daily, Charlynne Cousins, MD, 50 mg at 04/16/16 0901 .  mirtazapine (REMERON) tablet 7.5 mg, 7.5 mg, Oral, QHS, [name], MD, 7.5 mg at 04/15/16 2154 .  multivitamin with minerals tablet 1 tablet, 1 tablet, Oral, Daily, Reyne Dumas, MD, 1 tablet at 04/16/16 0901 .  ondansetron (ZOFRAN) injection 4 mg, 4 mg, Intravenous, Q4H PRN, [name], MD .  ondansetron [facility]) injection 4 mg, 4 mg, Intravenous, TID AC, [name], MD, 4 mg at 04/16/16 1152 .  pantoprazole (PROTONIX) EC tablet 40 mg, 40 mg, Oral, Daily, Reyne Dumas, MD, 40 mg at 04/16/16 0900 .  polyethylene glycol (MIRALAX / GLYCOLAX) packet 17 g, 17 g, Oral,  Daily, [name], MD, 17 g at 04/14/16 [phone] .  senna-docusate (Senokot-S) tablet 1 tablet, 1 tablet, Oral, QHS PRN, [name], MD .  sodium phosphate (FLEET) 7-19 GM/118ML enema 1 enema, 1 enema, Rectal, Once PRN, [name], MD .  thiamine (VITAMIN B-1) tablet 100 mg, 100 mg, Oral, Daily, 100 mg at 04/16/16 0901 **OR** [DISCONTINUED] thiamine (B-1) injection 100 mg, 100 mg, Intravenous, Daily, Reyne Dumas, MD .  zolpidem (AMBIEN) tablet 5 mg, 5 mg, Oral, QHS PRN, [name], MD  Facility-Administered Medications Ordered in Other Encounters:  .  polymixin-bacitracin (POLYSPORIN) ointment, , Topical, BID, [name], MD:  Allergies  Allergen Reactions  . Eggs Or Egg-Derived Products Nausea And Vomiting  :  Family History  Problem Relation Age of Onset  . Heart attack Father   . Heart disease Father     After age 36  .  Cancer Father   . Cancer Mother   . COPD Mother   . Emphysema Mother   . Obesity Sister   . Alcohol abuse Brother   . Colon cancer Neg Hx   :  Social History   Social History  . Marital status: Married    Spouse name: N/A  . Number of children: 3  . Years of education: N/A   Occupational History  . Stockbroker    Social History Main Topics  . Smoking status: Former Smoker    Packs/day: 4.00    Years: 25.00    Types: Cigarettes    Quit date: 02/11/1981  . Smokeless tobacco: Never Used  . Alcohol use 12.6 oz/week    21 Shots of liquor per week     Comment: daily 2-3 per day  . Drug use: No  . Sexual activity: Not Currently   Other Topics Concern  . Not on file   Social History Narrative  . No narrative on file  :  Pertinent items are noted in HPI.  Exam: Blood pressure (!) 150/79, pulse 86, temperature 97.4 F (36.3 C), temperature source Oral, resp. rate 16, height 5\' 8"  (1.727 m), weight 171 lb (77.6 kg), SpO2 94 %. General appearance: alert and cooperative Throat: lips, mucosa, and tongue normal; teeth and gums normal Neck: no adenopathy Back: negative Resp: clear to auscultation bilaterally Cardio: regular rate and rhythm, S1, S2 normal, no murmur, click, rub or gallop GI: soft, non-tender; bowel sounds normal; no masses,  no organomegaly Extremities: extremities normal, atraumatic, no cyanosis or edema Pulses: 2+ and symmetric Skin: Skin color, texture, turgor normal. No rashes or lesions Lymph nodes: Cervical, supraclavicular, and axillary nodes normal.   Recent Labs  04/15/16 0525 04/16/16 0951  WBC 18.2* 12.5*  HGB 12.6* 11.7*  HCT 37.7* 34.6*  PLT 214 200    Recent Labs  04/15/16 0525 04/16/16 0952  NA 139 135  K 4.5 4.0  CL 105 105  CO2 22 22  GLUCOSE 126* 128*  BUN 27* 25*  CREATININE 1.72* 1.67*  CALCIUM 8.8* 8.3*      Ct Renal Stone Study  Result Date: 04/10/2016 CLINICAL DATA:  Hematuria. History of bladder cancer. Severe  fatigue and difficulty ambulating for the past couple days. EXAM: CT ABDOMEN AND PELVIS WITHOUT CONTRAST TECHNIQUE: Multidetector CT imaging of the abdomen and pelvis was performed following the standard protocol without IV contrast. COMPARISON:  05/10/2015 FINDINGS: Lower chest: The lung bases demonstrate small bilateral pleural effusions. Overlying basilar scarring changes and tiny calcifications could be due to interstitial lung disease or aspiration. No  worrisome pulmonary lesions or acute overlying pulmonary process. The heart is upper limits of normal in size. No pericardial effusion. Dense three-vessel coronary artery calcifications are noted along with dense aortic calcifications. Hepatobiliary: Advanced cirrhotic changes involving the liver. No obvious hepatic lesion or intrahepatic biliary dilatation. Findings are fairly markedly progressive since the prior CT scan. The gallbladder is mildly distended. Suspect clearing gallstones versus sludge. No common bile duct dilatation. Pancreas: Mild diffuse atrophy of the pancreas but no mass, inflammation or ductal dilatation. Spleen: Normal size.  No focal lesions. Adrenals/Urinary Tract: The adrenal glands are unremarkable. Chronic but progressive right-sided hydroureteronephrosis. No focal renal lesion. No renal or obstructing ureteral calculi or bladder calculi. The right distal ureter demonstrates increased density and thickening worrisome for distal ureteral tumor. Could not exclude right-sided bladder base tumor. Moderate bladder wall thickening. The left ureter is normal. Stomach/Bowel: The stomach, duodenum, small bowel and colon are grossly normal. No inflammatory changes, mass lesions or obstructive findings. Vascular/Lymphatic: Advanced atherosclerotic calcifications involving the aorta and branch vessels. There is a infrarenal abdominal aortic aneurysm and chronic dissection. Maximum measurement is 3.6 cm. Possible focal penetrating ulcer. Advanced  atherosclerotic calcifications involving both iliac arteries with chronic dissections. Reproductive: Surgical changes involving the prostate gland. The seminal vesicles appear normal. Other: No pelvic lymphadenopathy. Small amount of free pelvic fluid. Large amount of abdominal fluid. Musculoskeletal: No significant bony findings. IMPRESSION: 1. Progressive right-sided hydroureteronephrosis. Could not exclude a distal ureteral tumor or right bladder base tumor. Contrast may be helpful for further evaluation if clinically indicated. 2. Rapid progression of cirrhosis involving the liver with large volume ascites. No focal hepatic lesions or splenomegaly. 3. Bilateral pleural effusions with overlying interstitial lung disease or chronic aspiration. 4. Advanced atherosclerotic calcifications involving the aorta and branch vessels. Persistent infrarenal abdominal aortic aneurysm and chronic aortic and iliac artery dissections. Electronically Signed   By: Marijo Sanes M.D.   On: 04/10/2016 16:22   Ir Nephrostomy Placement Right  Result Date: 04/15/2016 CLINICAL DATA:  History of urothelial carcinoma. Renal insufficiency, with right hydronephrosis. EXAM: 1. RIGHT PERCUTANEOUS NEPHROSTOMY CATHETER PLACEMENT UNDER ULTRASOUND AND FLUOROSCOPIC GUIDANCE 2. ANTEGRADE NEPHROSTOGRAM FLUOROSCOPY TIME:  4 minutes 46 seconds, 97 mGy TECHNIQUE: The procedure, risks (including but not limited to bleeding, infection, organ damage ), benefits, and alternatives were explained to the patient. Questions regarding the procedure were encouraged and answered. The patient understands and consents to the procedure. RightFlank region prepped with Betadine, draped in usual sterile fashion, infiltrated locally with 1% lidocaine As antibiotic prophylaxis, cefazolin 2 grams was ordered pre-procedure and administered intravenously within one hour of incision. Intravenous Fentanyl and Versed were administered as conscious sedation during continuous  monitoring of the patient's level of consciousness and physiological / cardiorespiratory status by the radiology RN, with a total moderate sedation time of 35 minutes. Under real-time ultrasound guidance, a 21-gauge trocar needle was advanced into a posterior lower pole calyx. Ultrasound image documentation was saved. Urine spontaneously returned through the needle. A 018 guidewire advanced, its position confirmed on fluoroscopy. Needle exchanged for transitional dilator. Contrast injection showed retroperitoneal spread of contrast with incomplete opacification of the renal collecting system. That reason, a different approach to the collecting system was taken. Again in similar fashion, under real-time ultrasound guidance a 21 gauge trocar needle with was advanced into a posterior lower pole calyx. Urine spontaneously returned through the hub. A 018 guidewire advanced easily. Needle was exchanged over a guidewire for transitional dilator. Contrast injection confirmed appropriate positioning. A  Kumpe catheter was advanced . A lesion in the mid right ureter could not be traversed with catheter and guidewire combinations. Antegrade nephrostogram performed. Catheter was exchanged over a guidewire for a 10 French pigtail catheter, formed centrally within the right renal collecting system. Contrast injection confirms appropriate positioning and patency. Catheter secured externally with 0 Prolene suture and placed to external drain bag. COMPLICATIONS: COMPLICATIONS none IMPRESSION: 1. Technically successful right percutaneous nephrostomy catheter placement. 2. Antegrade nephrostogram demonstrates tandem lesions. There is a focal near-occlusive stenosis in the mid right ureter. Inferior to an intervening dilated segment of ureter, There is a 2nd obstructive lesion in the distal ureter such that no contrast was seen entering the urinary bladder. Electronically Signed   By: Lucrezia Europe M.D.   On: 04/15/2016 15:16      Assessment and Plan:  76 year old gentleman with the following issues:  1. Abdominal ascites, failure to thrive and questionable malignant etiology: Patient hospitalized on March 1 of 2018 with failure to thrive and both renal failure and abdominal ascites. Imaging studies showed cirrhosis of the liver without any evidence of malignancy. He underwent a paracentesis on the cytology is pending. Preliminary report might indicate possible malignancy.  These findings was discussed with the patient today and I see no role for any further workup at this time. If the cytology do indicate malignant etiology for his ascites, further investigation for the source of malignancy is low yield and does not alter the recommendation at this time. If we are dealing with advanced malignancy that is causing abdominal ascites, as well as severe multilevel organ dysfunction and poor performance status, no treatment would be indicated. In this case, prognosis is poor and hospice is recommended.  2. Acute renal failure: He does have hydronephrosis but despite percutaneous nephrostomy tube his kidney function remains abnormal.  Please call with any questions regarding this pleasant gentleman.

## 2016-04-16 NOTE — Progress Notes (Addendum)
Progress Note   Subjective  Pathology contacted [place] regarding peritoneal fluid results this afternoon. 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 Otherwise patient is s/p R percutaneous nephrostomy   Objective   Vital signs in last 24 hours: Temp:  [97.4 F (36.3 C)-98 F (36.7 C)] 97.4 F (36.3 C) (03/06 1505) Pulse Rate:  [79-100] 86 (03/06 1505) Resp:  [16] 16 (03/06 1505) BP: (123-150)/(69-93) 150/79 (03/06 1505) SpO2:  [94 %-97 %] 94 % (03/06 1505) Last BM Date: 04/16/16 Exam not performed,   Intake/Output from previous day: 03/05 0701 - 03/06 0700 In: 425 [I.V.:310; IV Piggyback:100] Out: 30 [Urine:30] Intake/Output this shift: Total I/O In: 985 [I.V.:985] Out: 40 [Urine:40]  Lab Results:  Recent Labs  04/15/16 0525 04/16/16 0951  WBC 18.2* 12.5*  HGB 12.6* 11.7*  HCT 37.7* 34.6*  PLT 214 200   BMET  Recent Labs  04/14/16 1046 04/15/16 0525 04/16/16 0952  NA 134* 139 135  K 4.0 4.5 4.0  CL 104 105 105  CO2 21* 22 22  GLUCOSE 106* 126* 128*  BUN 23* 27* 25*  CREATININE 1.52* 1.72* 1.67*  CALCIUM 8.3* 8.8* 8.3*   LFT No results for input(s): PROT, ALBUMIN, AST, ALT, ALKPHOS, BILITOT, BILIDIR, IBILI in the last 72 hours. PT/INR No results for input(s): LABPROT, INR in the last 72 hours.  Studies/Results: Ir Nephrostomy Placement Right  Result Date: 04/15/2016 CLINICAL DATA:  History of urothelial carcinoma. Renal insufficiency, with right hydronephrosis. EXAM: 1. RIGHT PERCUTANEOUS NEPHROSTOMY CATHETER PLACEMENT UNDER ULTRASOUND AND FLUOROSCOPIC GUIDANCE 2. ANTEGRADE NEPHROSTOGRAM FLUOROSCOPY TIME:  4 minutes 46 seconds, 97 mGy TECHNIQUE: The procedure, risks (including but not limited to bleeding, infection, organ damage ), benefits, and alternatives were explained to the patient. Questions regarding the procedure were encouraged and answered. The patient understands and consents to the procedure. RightFlank region prepped with Betadine, draped  in usual sterile fashion, infiltrated locally with 1% lidocaine As antibiotic prophylaxis, cefazolin 2 grams was ordered pre-procedure and administered intravenously within one hour of incision. Intravenous Fentanyl and Versed were administered as conscious sedation during continuous monitoring of the patient's level of consciousness and physiological / cardiorespiratory status by the radiology RN, with a total moderate sedation time of 35 minutes. Under real-time ultrasound guidance, a 21-gauge trocar needle was advanced into a posterior lower pole calyx. Ultrasound image documentation was saved. Urine spontaneously returned through the needle. A 018 guidewire advanced, its position confirmed on fluoroscopy. Needle exchanged for transitional dilator. Contrast injection showed retroperitoneal spread of contrast with incomplete opacification of the renal collecting system. That reason, a different approach to the collecting system was taken. Again in similar fashion, under real-time ultrasound guidance a 21 gauge trocar needle with was advanced into a posterior lower pole calyx. Urine spontaneously returned through the hub. A 018 guidewire advanced easily. Needle was exchanged over a guidewire for transitional dilator. Contrast injection confirmed appropriate positioning. A Kumpe catheter was advanced . A lesion in the mid right ureter could not be traversed with catheter and guidewire combinations. Antegrade nephrostogram performed. Catheter was exchanged over a guidewire for a 10 French pigtail catheter, formed centrally within the right renal collecting system. Contrast injection confirms appropriate positioning and patency. Catheter secured externally with 0 Prolene suture and placed to external drain bag. COMPLICATIONS: COMPLICATIONS none IMPRESSION: 1. Technically successful right percutaneous nephrostomy catheter placement. 2. Antegrade nephrostogram demonstrates tandem lesions. There is a focal near-occlusive  stenosis in the mid right ureter. Inferior to an intervening  dilated segment of ureter, There is a 2nd obstructive lesion in the distal ureter such that no contrast was seen entering the urinary bladder. Electronically Signed   By: Lucrezia Europe M.D.   On: 04/15/2016 15:16       Assessment / Plan:   76 y/o male with a history of bladder cancer, R hydronephrosis, with cirrhotic appearing liver on imaging (thought to be related to alcohol) and also with ascites. SAAG at 0.6 which was not c/w portal hypertension. Pathology results today relayed from Dr. 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 More stains due back tomorrow to help clarify this further. I discussed findings with patient and answered his questions. Recommend dedicated Chest CT and await final path results. Treatment of malignant ascites is aimed at treatment of underlying malignancy. Cirrhosis otherwise appears compensated at this time, and alcohol abstinence is recommended.  We will sign off for now. Please contact us with additional questions.   [place] Cellar, MD [facility] Pager [phone]

## 2016-04-16 NOTE — Progress Notes (Signed)
Pt had 7 beat run of VT. Asymptomatic, VSS. 150/79, HR 86, O2 Sats 94% RA. MD Gaynelle Arabian made aware and gave VO to restart patient's home dose of Pradaxa. Home meds list shows patient takes Pradaxa 150mg  BID- consulted with pharmacy who states this is a normal dosage. Will order medication under VO of MD Tannenbaum.

## 2016-04-16 NOTE — Progress Notes (Signed)
Physical Therapy Treatment Patient Details Name: [name] MRN: [id number] DOB: 08-May-1940 Today's Date: 04/16/2016    History of Present Illness Pt is a 76 y.o. male with a history of CAD, EtOH abuse, chronic atrial fibrillation, diverticulosis, PUD, and bladder cancer diagnosed in 2017, status post cystoscopy and thulium laser TURP on 03/12/2016, admitted 2/12-2/15, for UTI, hematuria, dehydration, weakness, acute blood loss anemia secondary to hematuria, status post needing one unit of packed red blood cells on 2/13, readmitted 2/18 , because of abdominal pain, failure to thrive, hydroureteronephrosis with worsening renal function , S/P percutaneous nephrostomy placed.    PT Comments    The patient declined to stand with PT. Staff reports that he is able to ambulate to bathroom with 1 assist. Continue PT attempts. Wife in room and requesting a hospital bed. Deferred to RN .   Follow Up Recommendations  Home health PT;Supervision/Assistance - 24 hour;SNF     Equipment Recommendations  None recommended by PT    Recommendations for Other Services       Precautions / Restrictions Precautions Precautions: Fall Precaution Comments: nephrostomy, incontinence of urine    Mobility  Bed Mobility Overal bed mobility: Needs Assistance Bed Mobility: Supine to Sit;Sit to Supine     Supine to sit: Min assist Sit to supine: Supervision   General bed mobility comments: increased time and effort, assist with trunk, got self back into bed.  Transfers                 General transfer comment: patient declined to stand up, scooted self along the bed useing upper body. wife in room.  Ambulation/Gait                 Stairs            Wheelchair Mobility    Modified Rankin (Stroke Patients Only)       Balance                                    Cognition Arousal/Alertness: Awake/alert Behavior During Therapy: Flat affect                        Exercises      General Comments        Pertinent Vitals/Pain Faces Pain Scale: Hurts little more Pain Location: , back Pain Descriptors / Indicators: Sore Pain Intervention(s): Monitored during session    Home Living                      Prior Function            PT Goals (current goals can now be found in the care plan section) Progress towards PT goals: Not progressing toward goals - comment (patient is not participating today.)    Frequency    Min 3X/week      PT Plan Current plan remains appropriate    Co-evaluation             End of Session   Activity Tolerance: Patient limited by fatigue Patient left: in bed;with call bell/phone within reach;with family/visitor present;with bed alarm set Nurse Communication: Mobility status       Time: [id number] PT Time Calculation (min) (ACUTE ONLY): 17 min  Charges:  $Therapeutic Activity: 8-22 mins  G Codes:       Claretha Cooper 04/16/2016, 3:17 PM

## 2016-04-16 NOTE — Clinical Social Work Note (Signed)
Clinical Social Work Assessment  Patient Details  Name: [name] MRN: [id number] Date of Birth: 07-27-1940  Date of referral:  04/16/16               Reason for consult:  Facility Placement                Permission sought to share information with:  Chartered certified accountant granted to share information::  Yes, Verbal Permission Granted  Name::        Agency::     Relationship::     Contact Information:     Housing/Transportation Living arrangements for the past 2 months:  Single Family Home Source of Information:  Patient, Spouse Patient Interpreter Needed:  None Criminal Activity/Legal Involvement Pertinent to Current Situation/Hospitalization:  No - Comment as needed Significant Relationships:  Adult Children, Spouse Lives with:  Spouse Do you feel safe going back to the place where you live?  Yes Need for family participation in patient care:  Yes (Comment)  Care giving concerns:  CSW received consult that patient's family does not feel comfortable taking patient back home at discharge.    Social Worker assessment / plan:  CSW spoke with patient & wife, Gay Filler at bedside re: discharge planning - wife informed CSW that she had put a deposit down at [facility], requested that CSW check to see if patient would be able to go to rehab at [facility]. CSW called to confirm with Rosendo Gros at [facility] that they would not be able to take patient as he is not currently a resident and they have no availability in their rehab at this time. CSW provided patient's wife with SNF list and encouraged her to call CSW if she decides that SNF/Rehab is the route they would like to go at discharge. Wife states that at this time they are leaning towards taking him home with increased care at home, possibly through a [facility] program called [facility].   Employment status:  Retired Nurse, adult PT Recommendations:  Home with [place], [place] / Referral to community resources:     Patient/Family's Response to care:    Patient/Family's Understanding of and Emotional Response to Diagnosis, Current Treatment, and Prognosis:  Emotional Assessment Appearance:    Attitude/Demeanor/Rapport:    Affect (typically observed):    Orientation:  Oriented to Self, Oriented to Place, Oriented to  Time Alcohol / Substance use:    Psych involvement (Current and /or in the community):     Discharge Needs  Concerns to be addressed:    Readmission within the last 30 days:    Current discharge risk:    Barriers to Discharge:      Standley Brooking, LCSW 04/16/2016, 2:18 PM

## 2016-04-16 NOTE — Progress Notes (Signed)
Referring Physician(s): Tannenbaum,S  Supervising Physician: Marybelle Killings  Patient Status:  [facility] - In-pt  Chief Complaint:  Right hydronephrosis, bladder cancer  Subjective:  Pt with flat affect; denies acute c/o; family in room  Allergies: Eggs or egg-derived products  Medications: Prior to Admission medications   Medication Sig Start Date End Date Taking? Authorizing Provider  amLODipine (NORVASC) 5 MG tablet Take 5 mg by mouth daily.    Yes Historical Provider, MD  dabigatran (PRADAXA) 150 MG CAPS capsule Take 150 mg by mouth 2 (two) times daily.   Yes Historical Provider, MD  escitalopram (LEXAPRO) 10 MG tablet Take 10 mg by mouth daily.   Yes Historical Provider, MD  finasteride (PROSCAR) 5 MG tablet Take 1 tablet (5 mg total) by mouth daily. 03/29/16  Yes Nita Sells, MD  metoprolol succinate (TOPROL-XL) 100 MG 24 hr tablet Take 100 mg by mouth daily. Take with or immediately following a meal.   Yes Historical Provider, MD  omeprazole (PRILOSEC) 20 MG capsule Take 20 mg by mouth daily.   Yes Historical Provider, MD  ondansetron (ZOFRAN) 4 MG tablet Take 4 mg by mouth every 8 (eight) hours as needed for nausea or vomiting.   Yes Historical Provider, MD  rosuvastatin (CRESTOR) 20 MG tablet Take 20 mg by mouth 2 (two) times a week.    Yes Hendricks Limes, MD     Vital Signs: BP (!) 132/93 (BP Location: Right Arm)   Pulse 100   Temp 97.6 F (36.4 C) (Oral)   Resp 16   Ht 5\' 8"  (1.727 m)   Wt 171 lb (77.6 kg)   SpO2 97%   BMI 26.00 kg/m   Physical Exam right PCN intact, dressing dry, output 40-50 cc bloody urine, site NT; drain flushes easily with sterile NS  Imaging: Ir Nephrostomy Placement Right  Result Date: 04/15/2016 CLINICAL DATA:  History of urothelial carcinoma. Renal insufficiency, with right hydronephrosis. EXAM: 1. RIGHT PERCUTANEOUS NEPHROSTOMY CATHETER PLACEMENT UNDER ULTRASOUND AND FLUOROSCOPIC GUIDANCE 2. ANTEGRADE NEPHROSTOGRAM  FLUOROSCOPY TIME:  4 minutes 46 seconds, 97 mGy TECHNIQUE: The procedure, risks (including but not limited to bleeding, infection, organ damage ), benefits, and alternatives were explained to the patient. Questions regarding the procedure were encouraged and answered. The patient understands and consents to the procedure. RightFlank region prepped with Betadine, draped in usual sterile fashion, infiltrated locally with 1% lidocaine As antibiotic prophylaxis, cefazolin 2 grams was ordered pre-procedure and administered intravenously within one hour of incision. Intravenous Fentanyl and Versed were administered as conscious sedation during continuous monitoring of the patient's level of consciousness and physiological / cardiorespiratory status by the radiology RN, with a total moderate sedation time of 35 minutes. Under real-time ultrasound guidance, a 21-gauge trocar needle was advanced into a posterior lower pole calyx. Ultrasound image documentation was saved. Urine spontaneously returned through the needle. A 018 guidewire advanced, its position confirmed on fluoroscopy. Needle exchanged for transitional dilator. Contrast injection showed retroperitoneal spread of contrast with incomplete opacification of the renal collecting system. That reason, a different approach to the collecting system was taken. Again in similar fashion, under real-time ultrasound guidance a 21 gauge trocar needle with was advanced into a posterior lower pole calyx. Urine spontaneously returned through the hub. A 018 guidewire advanced easily. Needle was exchanged over a guidewire for transitional dilator. Contrast injection confirmed appropriate positioning. A Kumpe catheter was advanced . A lesion in the mid right ureter could not be traversed with catheter and guidewire  combinations. Antegrade nephrostogram performed. Catheter was exchanged over a guidewire for a 10 French pigtail catheter, formed centrally within the right renal  collecting system. Contrast injection confirms appropriate positioning and patency. Catheter secured externally with 0 Prolene suture and placed to external drain bag. COMPLICATIONS: COMPLICATIONS none IMPRESSION: 1. Technically successful right percutaneous nephrostomy catheter placement. 2. Antegrade nephrostogram demonstrates tandem lesions. There is a focal near-occlusive stenosis in the mid right ureter. Inferior to an intervening dilated segment of ureter, There is a 2nd obstructive lesion in the distal ureter such that no contrast was seen entering the urinary bladder. Electronically Signed   By: Lucrezia Europe M.D.   On: 04/15/2016 15:16    Labs:  CBC:  Recent Labs  03/27/16 0516 03/28/16 0452 04/10/16 1416 04/15/16 0525  WBC 9.6 9.1 13.3* 18.2*  HGB 10.0* 10.0* 12.4* 12.6*  HCT 29.5* 30.2* 37.6* 37.7*  PLT 175 172 322 214    COAGS:  Recent Labs  05/26/15 0605 07/28/15 0750 03/12/16 0625 04/10/16 1416 04/12/16 0549  INR 1.05  --   --  1.23 1.17  APTT  --  32 30  --   --     BMP:  Recent Labs  04/12/16 0549 04/14/16 1046 04/15/16 0525 04/16/16 0952  NA 134* 134* 139 135  K 4.0 4.0 4.5 4.0  CL 103 104 105 105  CO2 22 21* 22 22  GLUCOSE 84 106* 126* 128*  BUN 33* 23* 27* 25*  CALCIUM 8.3* 8.3* 8.8* 8.3*  CREATININE 1.65* 1.52* 1.72* 1.67*  GFRNONAA 39* 43* 37* 38*  GFRAA 45* 50* 43* 44*    LIVER FUNCTION TESTS:  Recent Labs  03/28/16 0452 04/10/16 1416 04/11/16 1141 04/12/16 0549  BILITOT 1.3* 1.2 1.3* 1.3*  AST 17 17 14* 15  ALT 10* 11* 11* 9*  ALKPHOS 60 85 71 64  PROT 5.1* 6.7 5.9* 5.4*  ALBUMIN 2.6* 3.0* 2.5* 2.4*    Assessment and Plan: Hx urothelial carcinoma, rt hydronephrosis, ascites/cirrhosis; s/p right PCN 3/5; AF; creat 1.67(1.72); cytology perit fluid/urine pending; cx's pend; plans as outlined by urology/IM/GI; if PCN output falls consider f/u nephrostogram to reassess location but cath currently flushes well; monitor  labs   Electronically Signed: D. Rowe Robert 04/16/2016, 11:06 AM   I spent a total of 15 minutes at the the patient's bedside AND on the patient's hospital floor or unit, greater than 50% of which was counseling/coordinating care for right perc nephrostomy    Patient ID: [name], male   DOB: 06-13-1940, 76 y.o.   MRN: [id number]

## 2016-04-16 NOTE — Progress Notes (Signed)
[facility]  Patient Status: Active  [facility] is providing the following services: PT  If patient discharges after hours, please call [phone].   [name] 04/16/2016, 1:32 PM

## 2016-04-16 NOTE — Progress Notes (Signed)
Urology Progress Note : D/c Planning Note: 1.     S:Right Hydronephrosis: Right perc nephrostomy, urine cytology, urine c/s; antegrade                                                                                     nephrostogram:                                                                                O: double lesions in ureter. ? Tumor/strictures. Will need decompression x 1 week and                                                                                     then repeat antegrade nephrostogram next week. Will also need repeat Cr/gfr to see if                                                                                    his CKD IIIb  39cc/min Improves. Overnight required IV fluid b/c he refuses to drink,                                                                                      Becoming dehydrated, with AKI again.                                                                                 A: Right perc tube to straight drain. IV D5 1/2 NS @ 75cc/hr  P: await cytology, decompression, repeat Cr/gfr/antegrade nephrostogram I n 1 week.                                                                                       Have had long family discussion with patient and family re: refusal to drink or eat.                                                                                        ( will defer to GI: await cytology report. ? Candidate for feeding tube-pt has declined                                                                                          So far.                                                                              2.  S.   Cirrhosis, ascites. ETOH.                                                                                  O: see note GI. Possible malignant ascites. ? Origin. ? Rx.             CT much different thatn CT from 1 year ago.                                                                                  A:   Possible malignant ascites per GI: Would impact Rx significantly.  P: Await GI note today                                                                             3.  S: Discharge planning:                                                                                 O: family wants Palliative care consult; inpatient rehab consult ( doubt he qualifies); and                                                                                      [facility] evaluation today.                                                                                   A:  Anticipate [facility] with Palliative care and d/c tomorrow                                                                                  P: Await Medicine, GI, PT, Palliative care consults today.                                                Subjective:      No acute urologic events overnight. Ambulation:   negative Flatus:    positive Bowel movement  negative  Pain: some relief  Objective:  Blood pressure (!) 132/93, pulse 100, temperature 97.6 F (36.4 C), temperature source Oral, resp. rate 16, height 5\' 8"  (1.727 m), weight 77.6 kg (171 lb), SpO2 97 %.  Physical Exam:  General:  No acute distress, awake Genitourinary:  Abd: General distention, liver edge not palpable. Ascites.  Foley: none. Right perc tube: serosanguinous.     I/O last 3 completed shifts: In: 425 [I.V.:310; Other:15; IV Piggyback:100]  Out: 30 [Urine:30]  Recent Labs     04/15/16  0525  HGB  12.6*  WBC  18.2*  PLT  214    Recent Labs     04/14/16  1046  04/15/16  0525  NA  134*  139  K  4.0  4.5  CL  104  105  CO2  21*  22  BUN  23*  27*  CREATININE  1.52*  1.72*  CALCIUM  8.3*  8.8*  GFRNONAA  43*  37*  GFRAA   50*  43*     No results for input(s): INR, APTT in the last 72 hours.  Invalid input(s): PT   Invalid input(s): ABG  Assessment/Plan: See Above.

## 2016-04-17 DIAGNOSIS — Z992 Dependence on renal dialysis: Secondary | ICD-10-CM

## 2016-04-17 MED ORDER — PROCHLORPERAZINE MALEATE 10 MG PO TABS: 10.0000 mg | ORAL_TABLET | Freq: Four times a day (QID) | ORAL | 0 refills | Status: AC | PRN

## 2016-04-17 MED ORDER — OXYCODONE HCL 20 MG/ML PO CONC: 5.0000 mg | ORAL | 0 refills | Status: AC | PRN

## 2016-04-17 MED ORDER — ONDANSETRON HCL 8 MG PO TABS: 8.0000 mg | ORAL_TABLET | Freq: Three times a day (TID) | ORAL | 2 refills | Status: DC | PRN

## 2016-04-17 MED ORDER — OXYCODONE HCL 20 MG/ML PO CONC: 5.0000 mg | ORAL | Status: DC | PRN

## 2016-04-17 MED ORDER — PROCHLORPERAZINE MALEATE 10 MG PO TABS
10.0000 mg | ORAL_TABLET | Freq: Four times a day (QID) | ORAL | Status: DC | PRN
  Administered 2016-04-17: 10 mg via ORAL
  Filled 2016-04-17: qty 1

## 2016-04-17 MED ORDER — MIRTAZAPINE 7.5 MG PO TABS: 15.0000 mg | ORAL_TABLET | Freq: Every day | ORAL | 0 refills | Status: AC

## 2016-04-17 MED ORDER — ONDANSETRON 8 MG PO TBDP: 8.0000 mg | ORAL_TABLET | Freq: Three times a day (TID) | ORAL | 0 refills | Status: AC

## 2016-04-17 MED ORDER — DIAZEPAM 5 MG/ML PO CONC: 5.0000 mg | Freq: Four times a day (QID) | ORAL | Status: DC | PRN

## 2016-04-17 MED ORDER — DIAZEPAM 5 MG/ML PO CONC: 5.0000 mg | Freq: Four times a day (QID) | ORAL | 0 refills | Status: AC | PRN

## 2016-04-17 MED ORDER — DIAZEPAM 1 MG/ML PO SOLN: 5.0000 mg | Freq: Four times a day (QID) | ORAL | Status: DC | PRN

## 2016-04-17 MED ORDER — ONDANSETRON 4 MG PO TBDP
8.0000 mg | ORAL_TABLET | Freq: Three times a day (TID) | ORAL | Status: DC
Start: 2016-04-17 — End: 2016-04-17

## 2016-04-17 NOTE — Discharge Summary (Signed)
I have modified the existing discharge plan as follows:  1. Hospice referral ([facility]) 2. DNR 3. Comfort Medications: Oxyfast PRN, Diazepam intensol, Scheduled and PRN Anti-Nausea medication  Allergies as of 04/17/2016      Reactions   Eggs Or Egg-derived Products Nausea And Vomiting      Medication List    STOP taking these medications   ondansetron 4 MG tablet Commonly known as:  ZOFRAN     TAKE these medications   amLODipine 5 MG tablet Commonly known as:  NORVASC Take 5 mg by mouth daily.   dabigatran 150 MG Caps capsule Commonly known as:  PRADAXA Take 150 mg by mouth 2 (two) times daily.   diazepam 5 MG/ML solution Commonly known as:  DIAZEPAM INTENSOL Take 1 mL (5 mg total) by mouth every 6 (six) hours as needed for anxiety or sedation.   escitalopram 10 MG tablet Commonly known as:  LEXAPRO Take 10 mg by mouth daily.   finasteride 5 MG tablet Commonly known as:  PROSCAR Take 1 tablet (5 mg total) by mouth daily.   metoprolol succinate 100 MG 24 hr tablet Commonly known as:  TOPROL-XL Take 100 mg by mouth daily. Take with or immediately following a meal.   mirtazapine 7.5 MG tablet Commonly known as:  REMERON Take 2 tablets (15 mg total) by mouth at bedtime.   omeprazole 20 MG capsule Commonly known as:  PRILOSEC Take 20 mg by mouth daily.   ondansetron 8 MG disintegrating tablet Commonly known as:  ZOFRAN-ODT Take 1 tablet (8 mg total) by mouth 3 (three) times daily.   oxyCODONE 20 MG/ML concentrated solution Commonly known as:  ROXICODONE INTENSOL Take 0.3 mLs (6 mg total) by mouth every 2 (two) hours as needed for moderate pain or severe pain (dyspnea). Hospice Patient   prochlorperazine 10 MG tablet Commonly known as:  COMPAZINE Take 1 tablet (10 mg total) by mouth every 6 (six) hours as needed for refractory nausea / vomiting.   rosuvastatin 20 MG tablet Commonly known as:  CRESTOR Take 20 mg by mouth 2 (two) times a week.

## 2016-04-17 NOTE — Progress Notes (Signed)
Completed D/C teaching with patient and wife. Patient will be D/C home with hospice care and family. Prescriptions given and medications discussed. Patient will be transported via [place]. Patient and wife aware.

## 2016-04-17 NOTE — Care Management Note (Signed)
Case Management Note  Patient Details  Name: [name] MRN: [id number] Date of Birth: 22-Jan-1941  Subjective/Objective:  76 y.o. M with Advanced Bladder Cancer admitted 04/10/2016. Pt is to be discharged today Home with Hospice services. Family seen by Dr Hilma Favors with Palliative Services and referal made to [facility] at the request of the family. CM spoke with Amy who will order needed DME(hospital bed, 3n1, and W/C) and is en-route to confer with family.                   Action/Plan:All PTAR info left with Bedside rn WITH INSTRUCTIONS TO CALL FOR TRANSPORT AFTER [facility] VISIT. nO FURTHER cm NEEDS AT PRESENT   Expected Discharge Date:  04/17/16               Expected Discharge Plan:  Home w Hospice Care  In-House Referral:  NA  Discharge planning Services  CM Consult  Post Acute Care Choice:  Hospice Choice offered to:  Patient, Spouse  DME Arranged:  Hospital bed, Walker rolling, 3-N-1 (All to be managed by [facility]) DME Agency:  [facility]:  PT [facility] Agency:  [place]  Status of Service:  Completed, signed off  If discussed at [place] of Stay Meetings, dates discussed:    Additional Comments:  Delrae Sawyers, RN 04/17/2016, 11:53 AM

## 2016-04-17 NOTE — Discharge Instructions (Signed)
Alcoholic Liver Disease °The liver is an organ that converts food into energy, absorbs vitamins from food, removes toxins from the blood, and makes proteins. Alcoholic liver disease happens when the liver becomes damaged due to alcohol consumption and it stops working properly. °What are the causes? °This condition is caused by drinking too much alcohol over a number of years. °What increases the risk? °This condition is more likely to develop in: °· Women. °· People who have a family history of the disease. °· People who have poor nutrition. °· People who are obese. °What are the signs or symptoms? °Early symptoms of this condition include: °· Fatigue. °· Weakness. °· Abdominal discomfort on the upper right side. °Symptoms of moderate disease include: °· Fever. °· Yellow, pale, or darkening skin. °· Abdominal pain. °· Nausea and vomiting. °· Weight loss. °· Loss of appetite. °Symptoms of advanced disease include: °· Abdominal swelling. °· Nosebleeds or bleeding gums. °· Itchy skin. °· Enlarged fingertips (clubbing). °· Light-colored stools that smell very bad (steatorrhea). °· Mood changes. °· Feeling agitated. °· Confusion. °· Trouble concentrating. °Some people do not have symptoms until the condition becomes severe. Symptoms are often worse after heavy drinking. °How is this diagnosed? °This condition is diagnosed with: °· A physical exam. °· Blood tests. °· Taking a sample of liver tissue to examine under a microscope (liver biopsy). °You may also have other tests, including: °· X-rays. °· Ultrasound. °How is this treated? °Treatment may include: °· Stopping alcohol use to allow the liver to heal. °· Joining a support group or meeting with a counselor. °· Medicines to reduce inflammation. These may be recommended if the disease is severe. °· A liver transplant. This is the only treatment if the disease is very severe. °· Nutritional therapy. This may involve: °¨ Taking vitamins. °¨ Eating foods that are high in  thiamine, such as whole-wheat cereals, pork, and raw vegetables. °¨ Eating foods that have a lot of folic acid, such as vegetables, fruits, meats, beans, nuts, and dairy foods. °¨ Eating a diet that includes carbohydrate-rich foods, such as yogurt, beans, potatoes, and rice. °Follow these instructions at home: °· Do not drink alcohol. °· Take medicines only as directed by your health care provider. °· Take vitamins only as directed by your health care provider. °· Follow any diet instructions that are given to you by your health care provider. °Contact a health care provider if: °· You have a fever. °· You have shortness of breath or have difficulty breathing. °· You have bright red blood in your stool, or you have black, tarry stools. °· You are vomiting blood. °· Your skin color becomes more yellow, pale, or dark. °· You develop headaches. °· You have trouble thinking. °· You have problems balancing or walking. °This information is not intended to replace advice given to you by your health care provider. Make sure you discuss any questions you have with your health care provider. °Document Released: 02/18/2014 Document Revised: 07/06/2015 Document Reviewed: 12/30/2013 °Elsevier Interactive Patient Education © 2017 [employer] ° °

## 2016-04-17 NOTE — Discharge Summary (Signed)
Physician Discharge Summary  Patient ID: [name]                                                                             PCP: Tivis Ringer, MD MRN: [id number]                                                                                       Wyatt Portela, MD DOB/AGE: [id number] y.o.                                                                   Manus Gunning, MD  Admit date: 04/10/2016 Discharge date: 04/17/2016  Admission Diagnoses:  1.  EtOH abuse,                                          2. chronic atrial fibrillation on Pradaxa,                                           3.CADs/p MI and balloon angioplasty in 1980's,                                          4. PVD s/p bilateral CEA in 2005: Followed by Dr. Angelena Form.                                          5.  High grade, non-invasive TCCbladder cancer diagnosed in 2017, status post TUR-BT, and post op BCG bladder wash chemoRx.                                           6.  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 admitted 2/12-2/15, for UTI, hematuria, dehydration, weakness, acute blood loss anemia secondary to hematuria, status post needing one unit of packed  red blood cells on 2/13,readmitted 2/18 , because of abdominal pain, failure to thrive, hydroureteronephrosis with worsening renal function.                                            8. CT: Significant cirrhosis with significant ascites, Sever Right hydronephrosis                                           9. Right ascites aspirate: poorly differentiated malignancy ( multiple stains nondiagnostic); ascites fluid Cr= serum.                                           10. Right perc nephrostomy: urine cytology negative. No change in Cr, except mild improvement in gfr 2ndary hydration.                                           11. Pan sensitive  enterococcus UTI, Rx Rocephin    Discharge Diagnoses:  Principal Problem:   Acute on chronic renal failure (HCC) Active Problems:   Chronic atrial fibrillation (HCC)   Chronic obstructive pyelonephritis   Ascites   Cirrhosis of liver with ascites (HCC)   Hydronephrosis   Discharged Condition: poor  Hospital Course:  Percutaneous ascites aspiration, percutaneous Right nephrostomy, antegrade nephrostogram, IV resuscitation, antibiotic. GI, Medicine, Oncology consultation   Significant Diagnostic Studies: Ir Nephrostomy Placement Right  Result Date: 04/15/2016 CLINICAL DATA:  History of urothelial carcinoma. Renal insufficiency, with right hydronephrosis. EXAM: 1. RIGHT PERCUTANEOUS NEPHROSTOMY CATHETER PLACEMENT UNDER ULTRASOUND AND FLUOROSCOPIC GUIDANCE 2. ANTEGRADE NEPHROSTOGRAM FLUOROSCOPY TIME:  4 minutes 46 seconds, 97 mGy TECHNIQUE: The procedure, risks (including but not limited to bleeding, infection, organ damage ), benefits, and alternatives were explained to the patient. Questions regarding the procedure were encouraged and answered. The patient understands and consents to the procedure. RightFlank region prepped with Betadine, draped in usual sterile fashion, infiltrated locally with 1% lidocaine As antibiotic prophylaxis, cefazolin 2 grams was ordered pre-procedure and administered intravenously within one hour of incision. Intravenous Fentanyl and Versed were administered as conscious sedation during continuous monitoring of the patient's level of consciousness and physiological / cardiorespiratory status by the radiology RN, with a total moderate sedation time of 35 minutes. Under real-time ultrasound guidance, a 21-gauge trocar needle was advanced into a posterior lower pole calyx. Ultrasound image documentation was saved. Urine spontaneously returned through the needle. A 018 guidewire advanced, its position confirmed on fluoroscopy. Needle exchanged for transitional dilator.  Contrast injection showed retroperitoneal spread of contrast with incomplete opacification of the renal collecting system. That reason, a different approach to the collecting system was taken. Again in similar fashion, under real-time ultrasound guidance a 21 gauge trocar needle with was advanced into a posterior lower pole calyx. Urine spontaneously returned through the hub. A 018 guidewire advanced easily. Needle was exchanged over a guidewire for transitional dilator. Contrast injection confirmed appropriate positioning. A Kumpe catheter was advanced . A lesion in the mid right ureter could not be traversed with catheter and guidewire combinations. Antegrade nephrostogram  performed. Catheter was exchanged over a guidewire for a 10 French pigtail catheter, formed centrally within the right renal collecting system. Contrast injection confirms appropriate positioning and patency. Catheter secured externally with 0 Prolene suture and placed to external drain bag. COMPLICATIONS: COMPLICATIONS none IMPRESSION: 1. Technically successful right percutaneous nephrostomy catheter placement. 2. Antegrade nephrostogram demonstrates tandem lesions. There is a focal near-occlusive stenosis in the mid right ureter. Inferior to an intervening dilated segment of ureter, There is a 2nd obstructive lesion in the distal ureter such that no contrast was seen entering the urinary bladder. Electronically Signed   By: Lucrezia Europe M.D.   On: 04/15/2016 15:16    Discharge Exam: Blood pressure (!) 143/97, pulse 98, temperature 97.8 F (36.6 C), temperature source Oral, resp. rate 18, height 5\' 8"  (1.727 m), weight 77.6 kg (171 lb), SpO2 94 %.   Disposition: 06-Home-Health Care Svc  Discharge Instructions    Care order/instruction:    Complete by:  As directed      Allergies as of 04/17/2016      Reactions   Eggs Or Egg-derived Products Nausea And Vomiting      Medication List    TAKE these medications   amLODipine 5 MG  tablet Commonly known as:  NORVASC Take 5 mg by mouth daily.   dabigatran 150 MG Caps capsule Commonly known as:  PRADAXA Take 150 mg by mouth 2 (two) times daily.   escitalopram 10 MG tablet Commonly known as:  LEXAPRO Take 10 mg by mouth daily.   finasteride 5 MG tablet Commonly known as:  PROSCAR Take 1 tablet (5 mg total) by mouth daily.   metoprolol succinate 100 MG 24 hr tablet Commonly known as:  TOPROL-XL Take 100 mg by mouth daily. Take with or immediately following a meal.   omeprazole 20 MG capsule Commonly known as:  PRILOSEC Take 20 mg by mouth daily.   ondansetron 4 MG tablet Commonly known as:  ZOFRAN Take 4 mg by mouth every 8 (eight) hours as needed for nausea or vomiting. What changed:  Another medication with the same name was added. Make sure you understand how and when to take each.   ondansetron 8 MG tablet Commonly known as:  ZOFRAN Take 1 tablet (8 mg total) by mouth every 8 (eight) hours as needed for nausea, vomiting or refractory nausea / vomiting (1/2 tablet-1 tabler). What changed:  You were already taking a medication with the same name, and this prescription was added. Make sure you understand how and when to take each.   rosuvastatin 20 MG tablet Commonly known as:  CRESTOR Take 20 mg by mouth 2 (two) times a week.      Follow-up Information    Call Ailene Rud, MD.   Specialty:  Urology Why:  As needed Contact information: [place] [address] [phone]           Signed: Ailene Rud MD  [facility] 04/17/2016, 8:55 AM

## 2016-04-17 NOTE — Progress Notes (Signed)
[facility] Liaison:  RN visit  Notified by Brandt Loosen, CMRN, of patient/family request for Hospice and [facility] services at home after discharge.  Chart and patient information will be reviewed with Dr. Cherie Ouch.  Hospice eligibility pending.  Writer spoke with patient and wife, [name], at bedside to initiate education related to hospice philosophy, services and team approach to care.  Patient and family voiced understanding of information provided.    Per discussion, plan is for discharge to home by [facility] 04/17/16.  Please send signed and completed DNR form home with patient/family.  Patient will need prescriptions for discharge comfort medications.   DME needs discussed.  Patient currently has BSC (3in1) in the home.  Patient expressed needs for hospital bed 1/2 rails, and standard wheelchair.  No other needs at this time per patient/wife.  [facility] Chartered certified accountant, Gayland Curry, notified and will contact [place] to arrange delivery to the home.  The home address has been  verified and is correct in the chart.  [name] is the family member to be contacted to arrange time of delivery.  [facility] Referral Center aware of the above.   Completed d/c summary will need to be faxed to [facility] at [phone], when final.  Please notify [facility] when patient is ready to leave the unit at discharge [phone] or [phone]). [facility] information and contact numbers have been given to [place] during the visit.  Above information shared with CMRN.  Please feel free to call with any questions.  Thank you for the referral.  Edyth Gunnels, RN, [facility] Liaison [phone]  All hospital liaison's are now on [place].

## 2016-04-17 NOTE — Consult Note (Signed)
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 CT shows progressive tumor including a possible penetrating ulceration near the infra renal aorta. Palliative consultation requested on day of discharge for hospice services.  Clinical Assessment and Goals of Care: CC: "I only have 1-3 weeks to live"  I introduced myself to Mr. Sato and the role of palliative care services-we had a good discussion about his prognosis and about how he wants to live with the time that he has left. There is clearly unspoken existential suffering present- but hopefully the home based hospice team can help with this- Mr. Clopper was very clear that his main goal for now was to get out of the hospital.  Main Goal: To go home today.   SUMMARY OF RECOMMENDATIONS    I discussed DNR, golden rod placed on the chart  Significant Nausea- he does not easily endorse suffering but he vomited while I was in the room  I addressed issues with ETOH addiction and death on his own terms  They are agreeable to hospice- choice is [facility]-wife used to be a hospice volunteer  Symptom Management: 1. Pain: Concentrated oxyfast SL q2 prn 2. Aggressive bowel regimen as tolerated-senna 3. Nausea: scheduled ODT zofran+compazine   Prognosis:   < 6 weeks  Discharge Planning: Home with Hospice      Primary Diagnoses: Present on Admission: . Chronic obstructive pyelonephritis . Chronic atrial  fibrillation (HCC)   I have reviewed the medical record, interviewed the patient and family, and examined the patient. The following aspects are pertinent.  Past Medical History:  Diagnosis Date  . AAA (abdominal aortic aneurysm) ([place]) 02-25-2011   US abdomen 3.4 cm x 3 cm last US done per pt  . Anemia   . CAD (coronary artery disease)   . Cancer [facility])    Bladder cancer  . Chronic atrial fibrillation (HCC)    Dr. Angelena Form  . Duodenal ulcer   . Dysrhythmia    Atrial fibrillation-Dr. Angelena Form follows  . Gout   . Heart attack   . Heart murmur   . Hematuria    "bladder cancer"  . Hemorrhoids   . History of blood transfusion    last -5 yrs ago "ulcer'  . History of GI bleed   . Hyperlipidemia   . Hypertension   . Macular degeneration, dry    BILATERAL  . Myocardial infarction    history of. 1983  . Renal insufficiency    Social History   Social History  . Marital status: Married    Spouse name: N/A  . Number of children: 3  . Years of education: N/A   Occupational  History  . Stockbroker    Social History Main Topics  . Smoking status: Former Smoker    Packs/day: 4.00    Years: 25.00    Types: Cigarettes    Quit date: 02/11/1981  . Smokeless tobacco: Never Used  . Alcohol use 12.6 oz/week    21 Shots of liquor per week     Comment: daily 2-3 per day  . Drug use: No  . Sexual activity: Not Currently   Other Topics Concern  . None   Social History Narrative  . None   Family History  Problem Relation Age of Onset  . Heart attack Father   . Heart disease Father     After age 25  . Cancer Father   . Cancer Mother   . COPD Mother   . Emphysema Mother   . Obesity Sister   . Alcohol abuse Brother   . Colon cancer Neg Hx    Scheduled Meds: . amoxicillin  500 mg Oral Q12H  . dabigatran  150 mg Oral Q12H  . escitalopram  10 mg Oral Daily  . feeding supplement (ENSURE ENLIVE)  237 mL Oral BID BM  . finasteride  5 mg Oral Daily  . folic acid  1 mg Oral  Daily  . metoprolol succinate  50 mg Oral Daily  . mirtazapine  7.5 mg Oral QHS  . multivitamin with minerals  1 tablet Oral Daily  . ondansetron (ZOFRAN) IV  4 mg Intravenous TID AC  . pantoprazole  40 mg Oral Daily  . polyethylene glycol  17 g Oral Daily  . thiamine  100 mg Oral Daily   Continuous Infusions: PRN Meds:.acetaminophen, bisacodyl, bisacodyl, diphenhydrAMINE **OR** diphenhydrAMINE, HYDROcodone-acetaminophen, iopamidol, ondansetron, senna-docusate, sodium phosphate, zolpidem Medications Prior to Admission:  Prior to Admission medications   Medication Sig Start Date End Date Taking? Authorizing Provider  amLODipine (NORVASC) 5 MG tablet Take 5 mg by mouth daily.    Yes Historical Provider, MD  dabigatran (PRADAXA) 150 MG CAPS capsule Take 150 mg by mouth 2 (two) times daily.   Yes Historical Provider, MD  escitalopram (LEXAPRO) 10 MG tablet Take 10 mg by mouth daily.   Yes Historical Provider, MD  finasteride (PROSCAR) 5 MG tablet Take 1 tablet (5 mg total) by mouth daily. 03/29/16  Yes Nita Sells, MD  metoprolol succinate (TOPROL-XL) 100 MG 24 hr tablet Take 100 mg by mouth daily. Take with or immediately following a meal.   Yes Historical Provider, MD  omeprazole (PRILOSEC) 20 MG capsule Take 20 mg by mouth daily.   Yes Historical Provider, MD  ondansetron (ZOFRAN) 4 MG tablet Take 4 mg by mouth every 8 (eight) hours as needed for nausea or vomiting.   Yes Historical Provider, MD  rosuvastatin (CRESTOR) 20 MG tablet Take 20 mg by mouth 2 (two) times a week.    Yes Hendricks Limes, MD  ondansetron (ZOFRAN) 8 MG tablet Take 1 tablet (8 mg total) by mouth every 8 (eight) hours as needed for nausea, vomiting or refractory nausea / vomiting (1/2 tablet-1 tabler). 04/17/16   [name], MD   Allergies  Allergen Reactions  . Eggs Or Egg-Derived Products Nausea And Vomiting   Review of Systems  Physical Exam  Vital Signs: BP (!) 143/97 (BP Location: Right Arm)    Pulse 98   Temp 97.8 F (36.6 C) (Oral)   Resp 18   Ht 5\' 8"  (1.727 m)   Wt 77.6 kg (171 lb)   SpO2  94%   BMI 26.00 kg/m  Pain Assessment: No/denies pain   Pain Score: 0-No pain   SpO2: SpO2: 94 % O2 Device:SpO2: 94 % O2 Flow Rate: .   IO: Intake/output summary:  Intake/Output Summary (Last 24 hours) at 04/17/16 1048 Last data filed at 04/17/16 1003  Gross per 24 hour  Intake          1678.75 ml  Output                0 ml  Net          1678.75 ml    LBM: Last BM Date: 04/16/16 Baseline Weight: Weight: 77.6 kg (171 lb) Most recent weight: Weight: 77.6 kg (171 lb)     Palliative Assessment/Data:   Flowsheet Rows   Flowsheet Row Most Recent Value  Intake Tab  Referral Department  Hospitalist  Unit at Time of Referral  Cardiac/Telemetry Unit  Palliative Care Primary Diagnosis  Other (Comment)  Date Notified  04/11/16  Palliative Care Type  Not seen  Reason Not Seen  Consult cancelled  Reason for referral  Clarify Goals of Care  Date of Admission  04/10/16  # of days IP prior to Palliative referral  1  Clinical Assessment  Palliative Performance Scale Score  40%  Pain Max last 24 hours  0  Pain Min Last 24 hours  0  Dyspnea Max Last 24 Hours  0  Dyspnea Min Last 24 hours  0  Nausea Max Last 24 Hours  10  Nausea Min Last 24 Hours  10  Anxiety Max Last 24 Hours  10  Anxiety Min Last 24 Hours  10  Other Max Last 24 Hours  0  Psychosocial & Spiritual Assessment  Palliative Care Outcomes  Palliative Care Outcomes  Changed CPR status, Changed to focus on comfort, Clarified goals of care, Completed durable DNR, Transitioned to hospice       Time Total: 50 min Greater than 50%  of this time was spent counseling and coordinating care related to the above assessment and plan.  Signed by: Lane Hacker, DO   Please contact Palliative Medicine Team phone at [phone] for questions and concerns.  For individual provider: See Shea Evans

## 2016-04-17 NOTE — Progress Notes (Signed)
Urology Progress Note S:  [name] is an 76 y.o. male with a history of: 1.  EtOH abuse,                                                                                                                                  2. chronic atrial fibrillation on Pradaxa,                                                                                                                                   3.CADs/p MI and balloon angioplasty in 1980's,                                                                                                                                 4. PVD s/p bilateral CEA in 2005: Followed by Dr. Angelena Form.                                                                                                                                  5.  High grade, non-invasive TCCbladder cancer diagnosed in 2017, status post TUR-BT, and post op BCG bladder wash chemoRx.  6.  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 admitted 2/12-2/15, for UTI, hematuria, dehydration, weakness, acute blood loss anemia secondary to hematuria, status post needing one unit of packed red blood cells on 2/13,readmitted 2/18 , because of abdominal pain, failure to thrive, hydroureteronephrosis with worsening renal function.                                                                                                                                 8. CT: Significant cirrhosis with significant ascites, Sever Right hydronephrosis                                                                                                                                9. Right ascites aspirate: poorly differentiated  malignancy ( multiple stains nondiagnostic); ascites fluid Cr= serum.                                                                                                                                 10. Right perc nephrostomy: urine cytology  negative. No change in Cr, except mild improvement in gfr 2ndary hydration.                                                                                                                                  11. Pan sensitive enterococcus UTI, Rx Rocephin     Subjective: no apetite. Not drinking. Situation discussed with patient. Seen by Oncology and GI also. Family discussion with wife and  2 sons. Seen by PT, evaluated for [facility]. ( no room, but has application).      No acute urologic events overnight. Ambulation:   negative Flatus:    negative Bowel movement  negative  Pain: complete resolution  Objective:  Blood pressure (!) 143/97, pulse 98, temperature 97.8 F (36.6 C), temperature source Oral, resp. rate 18, height 5\' 8"  (1.727 m), weight 77.6 kg (171 lb), SpO2 94 %.  Physical Exam:  General:  No acute distress, awake  Genitourinary:  Right perc serosanguinous fluid Foley: none    I/O last 3 completed shifts: In: 2520 [I.V.:2495; Other:25] Out: 5 [Urine:70]  Recent Labs     04/15/16  0525  04/16/16  0951  HGB  12.6*  11.7*  WBC  18.2*  12.5*  PLT  214  200    Recent Labs     04/15/16  0525  04/16/16  0952  NA  139  135  K  4.5  4.0  CL  105  105  CO2  22  22  BUN  27*  25*  CREATININE  1.72*  1.67*  CALCIUM  8.8*  8.3*  GFRNONAA  37*  38*  GFRAA  43*  44*      Assessment/Plan:  D/c home today if Hospice sees in time. Hospice evaluation today ( was to see yesterday)

## 2016-04-18 DIAGNOSIS — I2510 Atherosclerotic heart disease of native coronary artery without angina pectoris: Secondary | ICD-10-CM | POA: Diagnosis not present

## 2016-04-18 DIAGNOSIS — D62 Acute posthemorrhagic anemia: Secondary | ICD-10-CM | POA: Diagnosis not present

## 2016-04-18 DIAGNOSIS — F1010 Alcohol abuse, uncomplicated: Secondary | ICD-10-CM | POA: Diagnosis not present

## 2016-04-18 DIAGNOSIS — Z87891 Personal history of nicotine dependence: Secondary | ICD-10-CM | POA: Diagnosis not present

## 2016-04-18 DIAGNOSIS — Z7901 Long term (current) use of anticoagulants: Secondary | ICD-10-CM | POA: Diagnosis not present

## 2016-04-18 DIAGNOSIS — N183 Chronic kidney disease, stage 3 (moderate): Secondary | ICD-10-CM | POA: Diagnosis not present

## 2016-04-18 DIAGNOSIS — I447 Left bundle-branch block, unspecified: Secondary | ICD-10-CM | POA: Diagnosis not present

## 2016-04-18 DIAGNOSIS — I129 Hypertensive chronic kidney disease with stage 1 through stage 4 chronic kidney disease, or unspecified chronic kidney disease: Secondary | ICD-10-CM | POA: Diagnosis not present

## 2016-04-18 DIAGNOSIS — I714 Abdominal aortic aneurysm, without rupture: Secondary | ICD-10-CM | POA: Diagnosis not present

## 2016-04-18 DIAGNOSIS — C679 Malignant neoplasm of bladder, unspecified: Secondary | ICD-10-CM | POA: Diagnosis not present

## 2016-04-18 DIAGNOSIS — I482 Chronic atrial fibrillation: Secondary | ICD-10-CM | POA: Diagnosis not present

## 2016-04-18 DIAGNOSIS — N390 Urinary tract infection, site not specified: Secondary | ICD-10-CM | POA: Diagnosis not present

## 2016-04-21 LAB — CULTURE, BLOOD (ROUTINE X 2)
CULTURE: NO GROWTH
CULTURE: NO GROWTH

## 2016-05-12 DEATH — deceased

## 2016-07-19 NOTE — Addendum Note (Signed)
Addendum  created 07/19/16 1594 by Lyn Hollingshead, MD   Sign clinical note

## 2017-07-25 IMAGING — CT CT RENAL STONE PROTOCOL
2 of 4 series · 15 of 46 positions shown, 17 images · non-contrast
Comparison: 05/10/2015

CLINICAL DATA: Hematuria. History of bladder cancer. Severe fatigue
and difficulty ambulating for the past couple days.

EXAM:
CT ABDOMEN AND PELVIS WITHOUT CONTRAST
TECHNIQUE: Multidetector CT imaging of the abdomen and pelvis was performed
following the standard protocol without IV contrast.

[Series 2: axial st · axial · 0.81mm/px · z∈[-500,-75]mm · 12 of 97 slices shown, 14 images]
[im 6/97  soft-tissue]
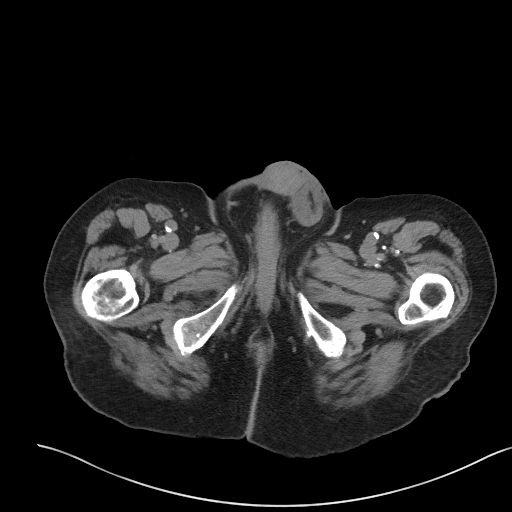
[im 6/97  bone]
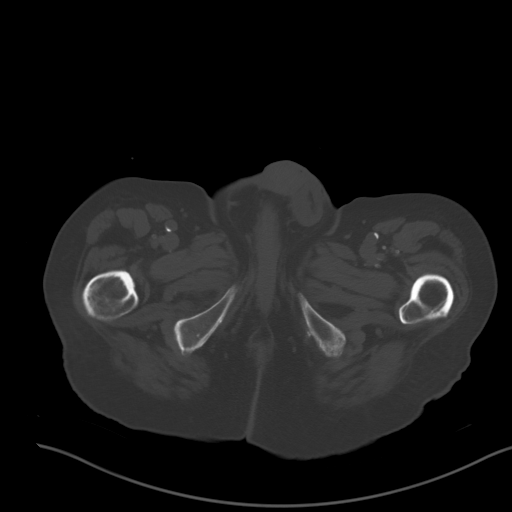
[im 16/97  soft-tissue]
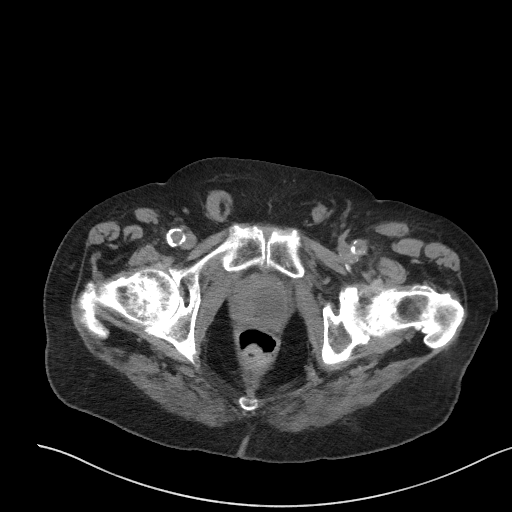
[im 21/97  soft-tissue]
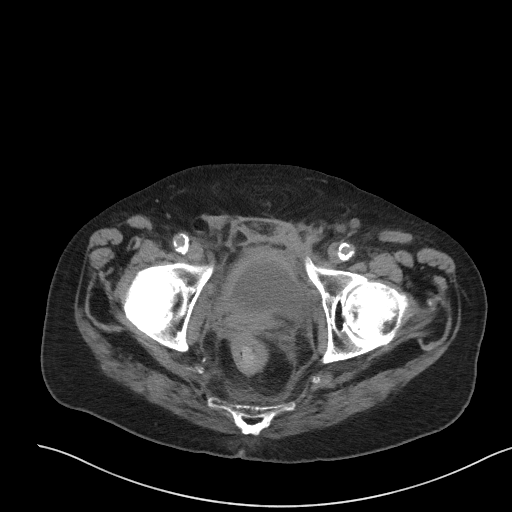
[im 31/97  soft-tissue]
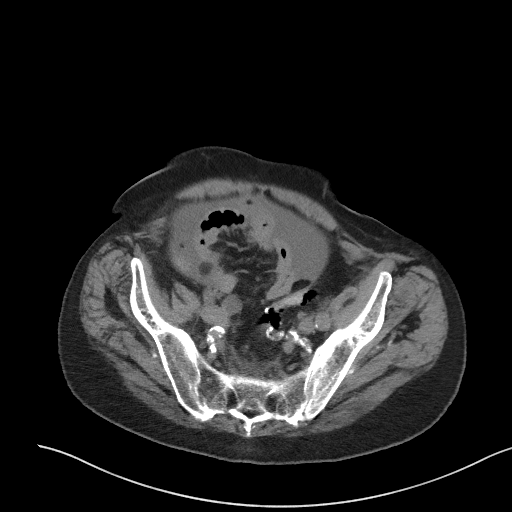
[im 36/97  soft-tissue]
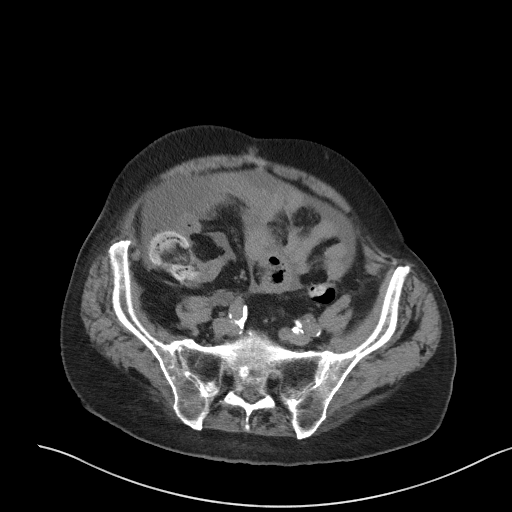
[im 46/97  soft-tissue]
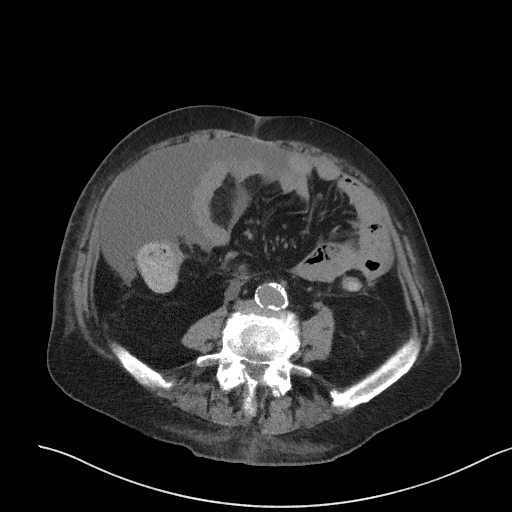
[im 51/97  soft-tissue]
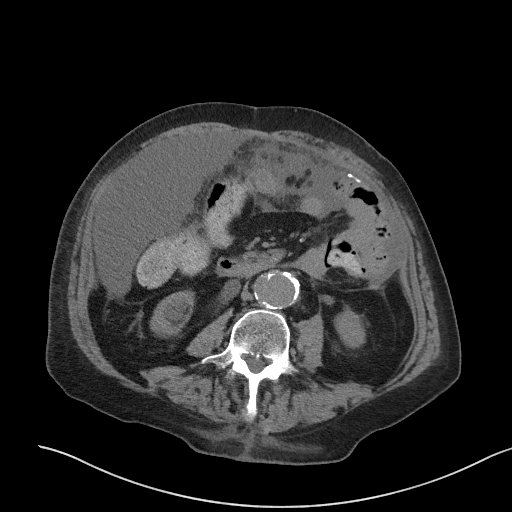
[im 61/97  soft-tissue]
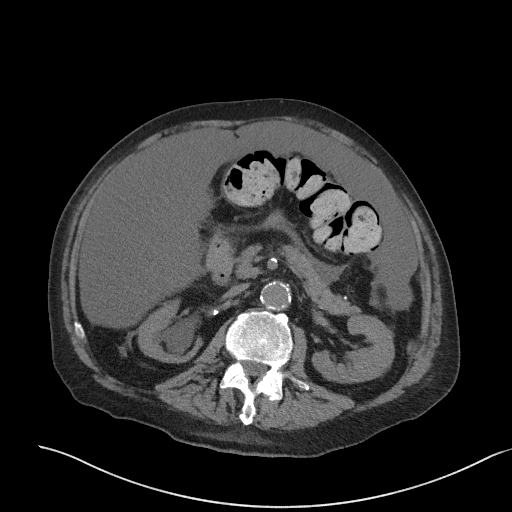
[im 66/97  soft-tissue]
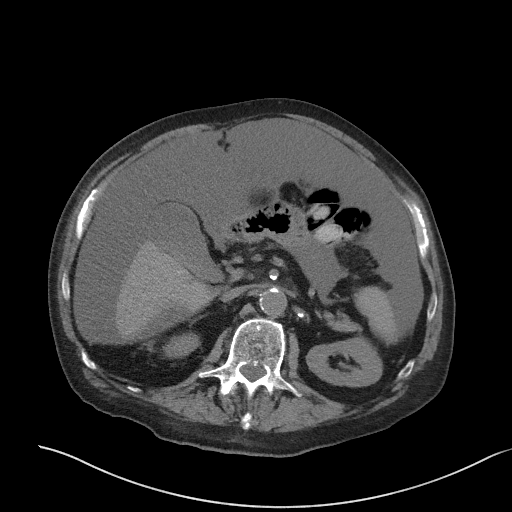
[im 66/97  bone]
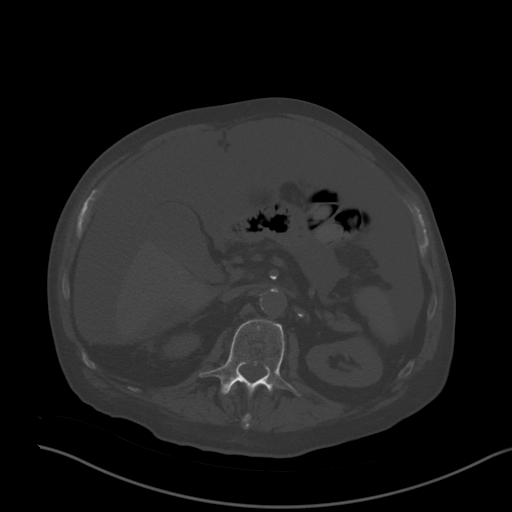
[im 76/97  soft-tissue]
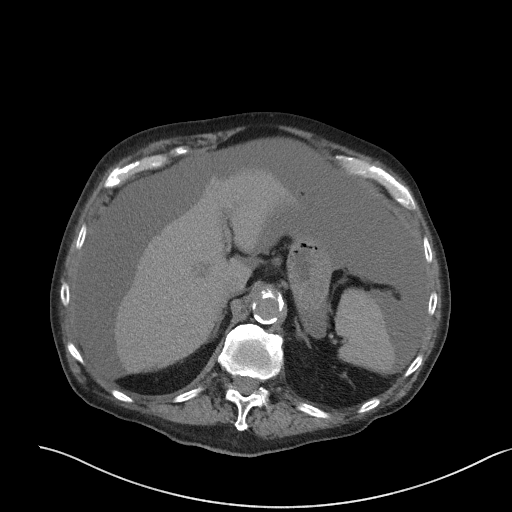
[im 81/97  soft-tissue]
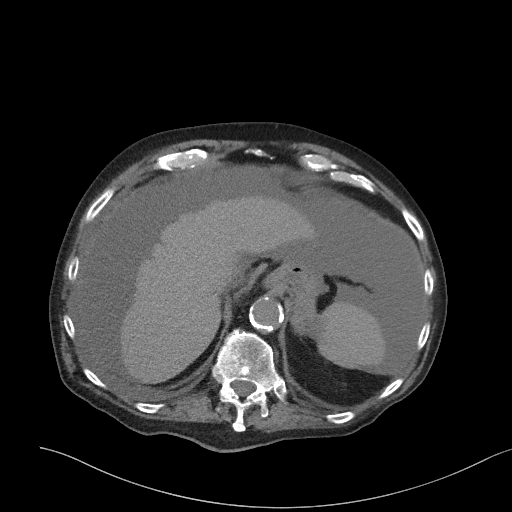
[im 91/97  soft-tissue]
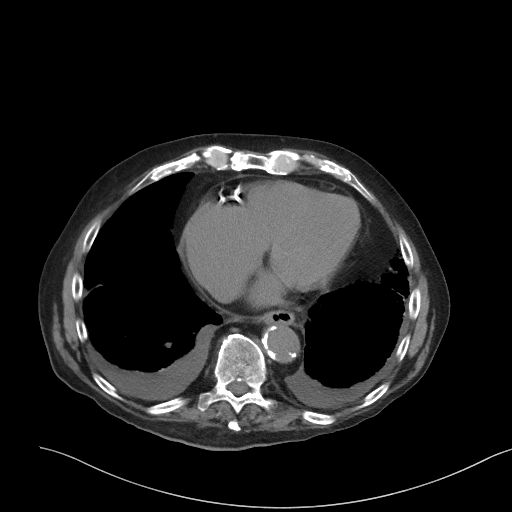

[Series 5: coronal · coronal · 0.80mm/px · 3 of 156 slices shown]
[im 52/156  soft-tissue]
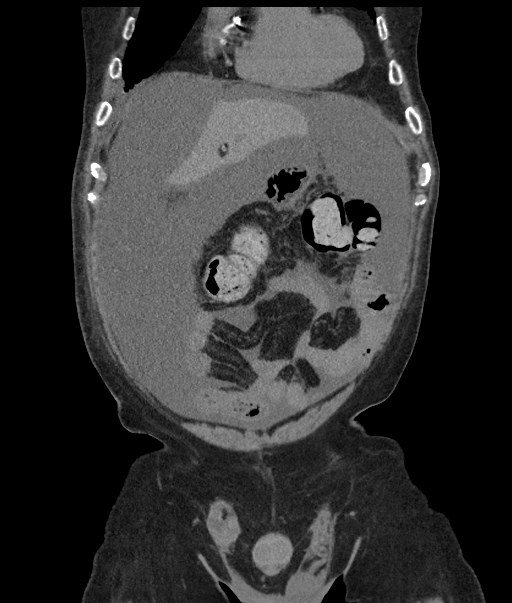
[im 69/156  soft-tissue]
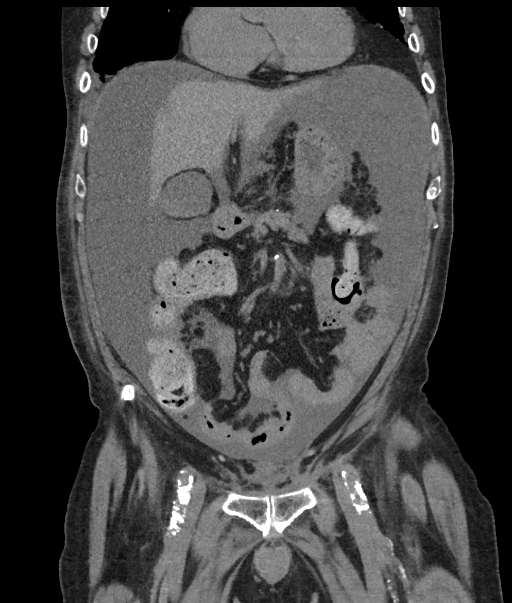
[im 87/156  soft-tissue]
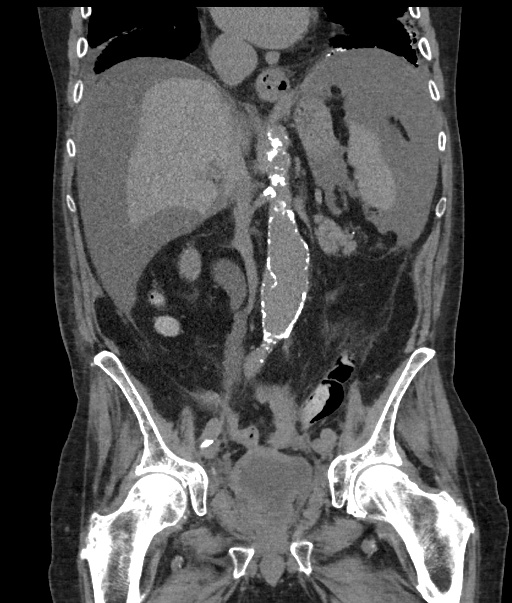

[15 of 46 positions shown; findings below may reference images not displayed]

FINDINGS: Lower chest: The lung bases demonstrate small bilateral pleural
effusions. Overlying basilar scarring changes and tiny
calcifications could be due to interstitial lung disease or
aspiration. No worrisome pulmonary lesions or acute overlying
pulmonary process. The heart is upper limits of normal in size. No
pericardial effusion. Dense three-vessel coronary artery
calcifications are noted along with dense aortic calcifications.

Hepatobiliary: Advanced cirrhotic changes involving the liver. No
obvious hepatic lesion or intrahepatic biliary dilatation. Findings
are fairly markedly progressive since the prior CT scan. The
gallbladder is mildly distended. Suspect clearing gallstones versus
sludge. No common bile duct dilatation.

Pancreas: Mild diffuse atrophy of the pancreas but no mass,
inflammation or ductal dilatation.

Spleen: Normal size.  No focal lesions.

Adrenals/Urinary Tract: The adrenal glands are unremarkable. Chronic
but progressive right-sided hydroureteronephrosis. No focal renal
lesion. No renal or obstructing ureteral calculi or bladder calculi.

The right distal ureter demonstrates increased density and
thickening worrisome for distal ureteral tumor. Could not exclude
right-sided bladder base tumor. Moderate bladder wall thickening.
The left ureter is normal.

Stomach/Bowel: The stomach, duodenum, small bowel and colon are
grossly normal. No inflammatory changes, mass lesions or obstructive
findings.

Vascular/Lymphatic: Advanced atherosclerotic calcifications
involving the aorta and branch vessels. There is a infrarenal
abdominal aortic aneurysm and chronic dissection. Maximum
measurement is 3.6 cm. Possible focal penetrating ulcer. Advanced
atherosclerotic calcifications involving both iliac arteries with
chronic dissections.

Reproductive: Surgical changes involving the prostate gland. The
seminal vesicles appear normal.

Other: No pelvic lymphadenopathy. Small amount of free pelvic fluid.
Large amount of abdominal fluid.

Musculoskeletal: No significant bony findings.
IMPRESSION: 1. Progressive right-sided hydroureteronephrosis. Could not exclude
a distal ureteral tumor or right bladder base tumor. Contrast may be
helpful for further evaluation if clinically indicated.
2. Rapid progression of cirrhosis involving the liver with large
volume ascites. No focal hepatic lesions or splenomegaly.
3. Bilateral pleural effusions with overlying interstitial lung
disease or chronic aspiration.
4. Advanced atherosclerotic calcifications involving the aorta and
branch vessels. Persistent infrarenal abdominal aortic aneurysm and
chronic aortic and iliac artery dissections.

## 2017-07-26 IMAGING — DX DG CHEST 1V PORT
1 series · 1 of 1 positions shown · non-contrast
Comparison: 04/10/2016.

CLINICAL DATA: Ascites .

EXAM:
PORTABLE CHEST 1 VIEW

[chest ap]
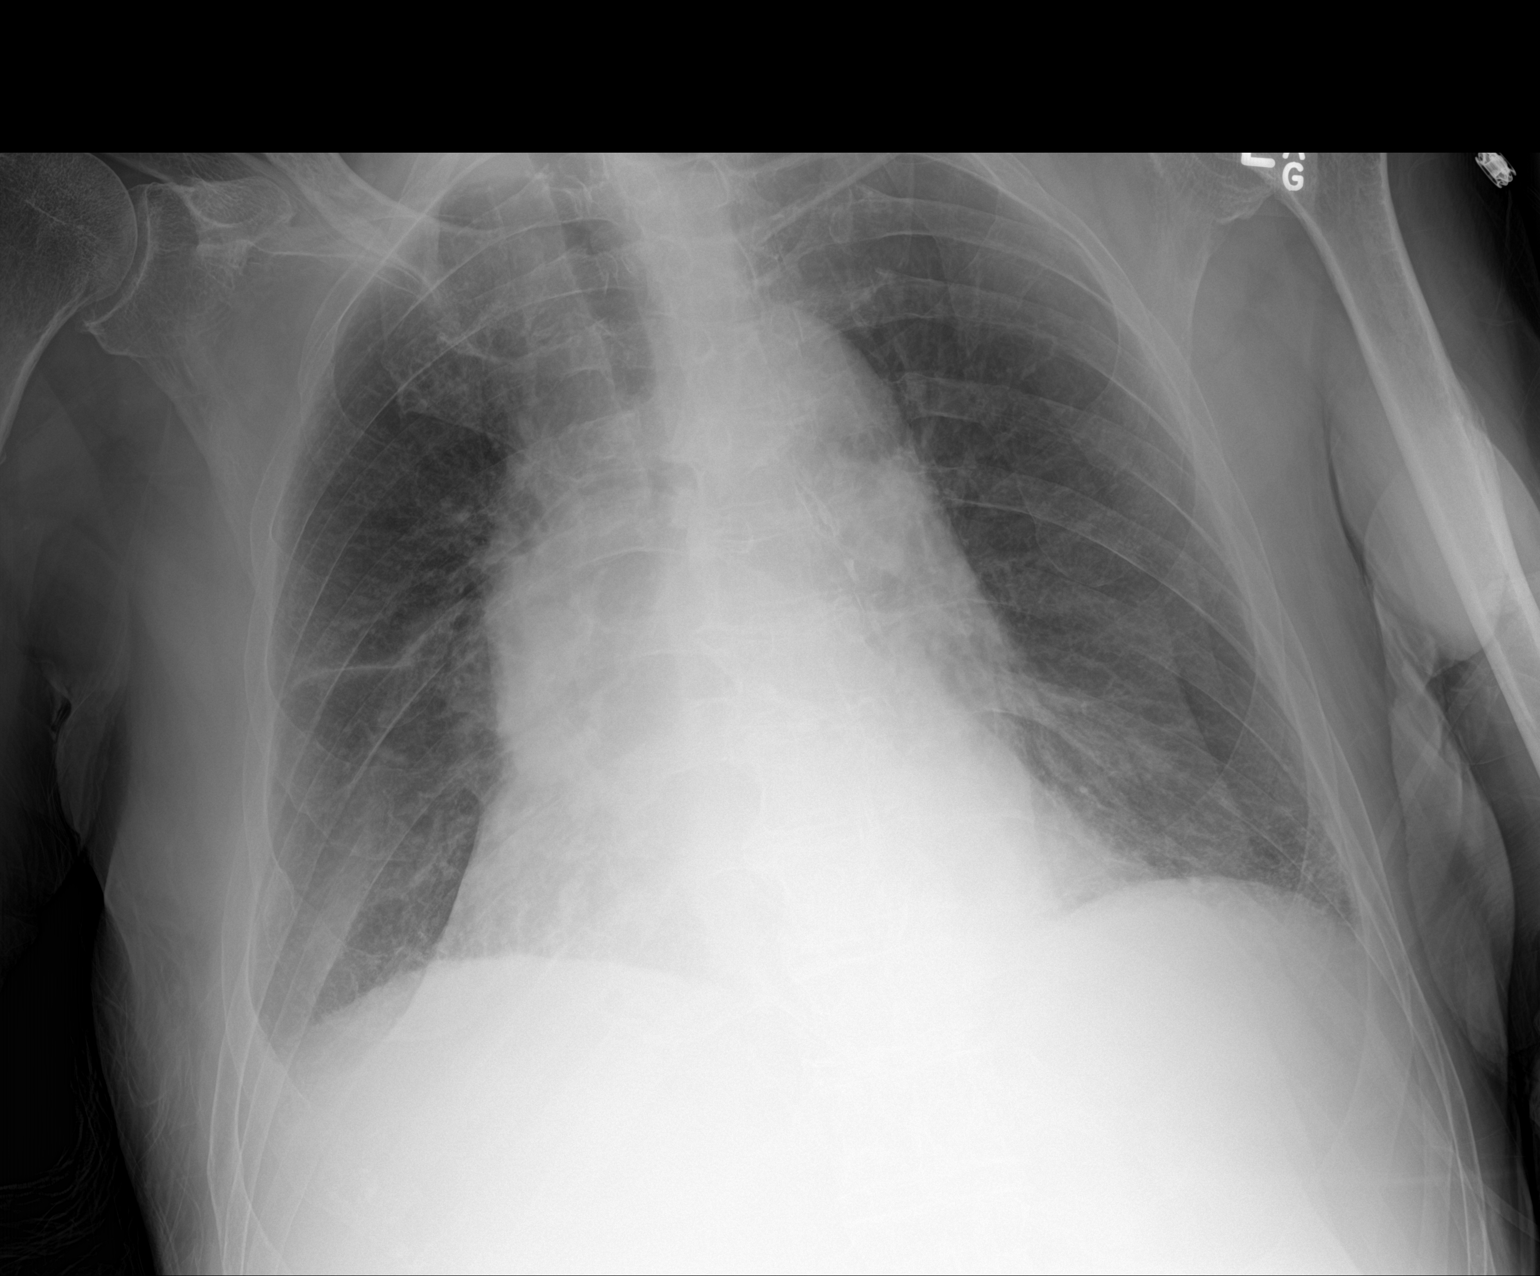

[1 of 1 positions shown; findings below may reference images not displayed]

FINDINGS: Mediastinum is stable. Hilar fullness is again noted. This may be
related to prominent pulmonary vascularity and rotation of the
patient. A PA lateral chest x-ray suggested for further evaluation.
Pulmonary hilar fullness remains contrast-enhanced CT can be
obtained. Stable cardiomegaly. Mild basilar atelectasis. Small right
pleural effusion. Mild biapical pleural thickening noted consistent
scarring. No pneumothorax. No acute bony abnormality .
IMPRESSION: 1. Mild right hilar fullness is again noted. This may be related to
prominent pulmonary vascularity and rotation of the patient. PA and
lateral chest x-ray is suggested for further evaluation. If
pulmonary hilar fullness remains contrast-enhanced CT can be
obtained for further evaluation.

2. Basilar subsegmental atelectasis. Small right pleural effusion .

## 2020-11-07 ENCOUNTER — Encounter: Payer: Self-pay | Admitting: Internal Medicine
